# Patient Record
Sex: Female | Born: 1945 | Race: White | Hispanic: No | Smoking: Never smoker
Health system: Southern US, Community
[De-identification: ages and names within clinical notes are randomized; demographics above are authoritative.]

## PROBLEM LIST (undated history)

## (undated) DIAGNOSIS — M316 Other giant cell arteritis: Secondary | ICD-10-CM

## (undated) DIAGNOSIS — J449 Chronic obstructive pulmonary disease, unspecified: Secondary | ICD-10-CM

## (undated) DIAGNOSIS — J069 Acute upper respiratory infection, unspecified: Secondary | ICD-10-CM

## (undated) DIAGNOSIS — T4145XA Adverse effect of unspecified anesthetic, initial encounter: Secondary | ICD-10-CM

## (undated) DIAGNOSIS — I10 Essential (primary) hypertension: Secondary | ICD-10-CM

## (undated) DIAGNOSIS — I209 Angina pectoris, unspecified: Secondary | ICD-10-CM

## (undated) DIAGNOSIS — S0990XS Unspecified injury of head, sequela: Secondary | ICD-10-CM

## (undated) DIAGNOSIS — E7800 Pure hypercholesterolemia, unspecified: Secondary | ICD-10-CM

## (undated) DIAGNOSIS — D649 Anemia, unspecified: Secondary | ICD-10-CM

## (undated) DIAGNOSIS — K222 Esophageal obstruction: Secondary | ICD-10-CM

## (undated) DIAGNOSIS — N189 Chronic kidney disease, unspecified: Secondary | ICD-10-CM

## (undated) DIAGNOSIS — K317 Polyp of stomach and duodenum: Secondary | ICD-10-CM

## (undated) DIAGNOSIS — J189 Pneumonia, unspecified organism: Secondary | ICD-10-CM

## (undated) DIAGNOSIS — G44309 Post-traumatic headache, unspecified, not intractable: Secondary | ICD-10-CM

## (undated) DIAGNOSIS — F419 Anxiety disorder, unspecified: Secondary | ICD-10-CM

## (undated) DIAGNOSIS — M1990 Unspecified osteoarthritis, unspecified site: Secondary | ICD-10-CM

## (undated) DIAGNOSIS — K219 Gastro-esophageal reflux disease without esophagitis: Secondary | ICD-10-CM

## (undated) DIAGNOSIS — C801 Malignant (primary) neoplasm, unspecified: Secondary | ICD-10-CM

## (undated) DIAGNOSIS — H5440 Blindness, one eye, unspecified eye: Secondary | ICD-10-CM

## (undated) DIAGNOSIS — I639 Cerebral infarction, unspecified: Secondary | ICD-10-CM

## (undated) DIAGNOSIS — M719 Bursopathy, unspecified: Secondary | ICD-10-CM

## (undated) DIAGNOSIS — T8859XA Other complications of anesthesia, initial encounter: Secondary | ICD-10-CM

## (undated) DIAGNOSIS — R0602 Shortness of breath: Secondary | ICD-10-CM

## (undated) DIAGNOSIS — Z9981 Dependence on supplemental oxygen: Secondary | ICD-10-CM

## (undated) HISTORY — PX: CHOLECYSTECTOMY: SHX55

## (undated) HISTORY — PX: EYE SURGERY: SHX253

## (undated) HISTORY — PX: ABDOMINAL HYSTERECTOMY: SHX81

## (undated) HISTORY — DX: Other giant cell arteritis: M31.6

## (undated) HISTORY — PX: COLONOSCOPY: SHX174

## (undated) HISTORY — PX: BRAIN SURGERY: SHX531

## (undated) HISTORY — PX: HEEL SPUR SURGERY: SHX665

## (undated) HISTORY — DX: Esophageal obstruction: K22.2

## (undated) HISTORY — DX: Polyp of stomach and duodenum: K31.7

## (undated) HISTORY — PX: APPENDECTOMY: SHX54

---

## 1999-03-18 ENCOUNTER — Other Ambulatory Visit: Admission: RE | Admit: 1999-03-18 | Discharge: 1999-03-18 | Payer: Self-pay | Admitting: Family Medicine

## 2011-11-16 ENCOUNTER — Emergency Department (HOSPITAL_COMMUNITY): Payer: Medicare Other

## 2011-11-16 ENCOUNTER — Observation Stay (HOSPITAL_COMMUNITY)
Admission: EM | Admit: 2011-11-16 | Discharge: 2011-11-18 | Discharge status: Against Medical Advice | Payer: Medicare Other | Source: Ambulatory Visit | Attending: Internal Medicine | Admitting: Internal Medicine

## 2011-11-16 ENCOUNTER — Encounter (HOSPITAL_COMMUNITY): Payer: Self-pay | Admitting: *Deleted

## 2011-11-16 DIAGNOSIS — E119 Type 2 diabetes mellitus without complications: Secondary | ICD-10-CM | POA: Insufficient documentation

## 2011-11-16 DIAGNOSIS — D509 Iron deficiency anemia, unspecified: Secondary | ICD-10-CM | POA: Insufficient documentation

## 2011-11-16 DIAGNOSIS — R0609 Other forms of dyspnea: Secondary | ICD-10-CM | POA: Insufficient documentation

## 2011-11-16 DIAGNOSIS — R079 Chest pain, unspecified: Secondary | ICD-10-CM | POA: Diagnosis present

## 2011-11-16 DIAGNOSIS — R1013 Epigastric pain: Secondary | ICD-10-CM | POA: Insufficient documentation

## 2011-11-16 DIAGNOSIS — R1310 Dysphagia, unspecified: Secondary | ICD-10-CM | POA: Insufficient documentation

## 2011-11-16 DIAGNOSIS — R0602 Shortness of breath: Secondary | ICD-10-CM

## 2011-11-16 DIAGNOSIS — R0989 Other specified symptoms and signs involving the circulatory and respiratory systems: Secondary | ICD-10-CM | POA: Insufficient documentation

## 2011-11-16 DIAGNOSIS — I10 Essential (primary) hypertension: Secondary | ICD-10-CM | POA: Insufficient documentation

## 2011-11-16 DIAGNOSIS — R609 Edema, unspecified: Secondary | ICD-10-CM

## 2011-11-16 DIAGNOSIS — R0789 Other chest pain: Principal | ICD-10-CM | POA: Insufficient documentation

## 2011-11-16 DIAGNOSIS — R1319 Other dysphagia: Secondary | ICD-10-CM

## 2011-11-16 DIAGNOSIS — E1121 Type 2 diabetes mellitus with diabetic nephropathy: Secondary | ICD-10-CM | POA: Diagnosis present

## 2011-11-16 DIAGNOSIS — K3189 Other diseases of stomach and duodenum: Secondary | ICD-10-CM | POA: Insufficient documentation

## 2011-11-16 DIAGNOSIS — N289 Disorder of kidney and ureter, unspecified: Secondary | ICD-10-CM | POA: Insufficient documentation

## 2011-11-16 DIAGNOSIS — K317 Polyp of stomach and duodenum: Secondary | ICD-10-CM | POA: Diagnosis present

## 2011-11-16 DIAGNOSIS — M316 Other giant cell arteritis: Secondary | ICD-10-CM | POA: Insufficient documentation

## 2011-11-16 DIAGNOSIS — D131 Benign neoplasm of stomach: Secondary | ICD-10-CM | POA: Insufficient documentation

## 2011-11-16 HISTORY — DX: Other complications of anesthesia, initial encounter: T88.59XA

## 2011-11-16 HISTORY — DX: Cerebral infarction, unspecified: I63.9

## 2011-11-16 HISTORY — DX: Unspecified osteoarthritis, unspecified site: M19.90

## 2011-11-16 HISTORY — DX: Acute upper respiratory infection, unspecified: J06.9

## 2011-11-16 HISTORY — DX: Pneumonia, unspecified organism: J18.9

## 2011-11-16 HISTORY — DX: Chronic kidney disease, unspecified: N18.9

## 2011-11-16 HISTORY — DX: Post-traumatic headache, unspecified, not intractable: G44.309

## 2011-11-16 HISTORY — DX: Blindness, one eye, unspecified eye: H54.40

## 2011-11-16 HISTORY — DX: Malignant (primary) neoplasm, unspecified: C80.1

## 2011-11-16 HISTORY — DX: Anxiety disorder, unspecified: F41.9

## 2011-11-16 HISTORY — DX: Gastro-esophageal reflux disease without esophagitis: K21.9

## 2011-11-16 HISTORY — DX: Adverse effect of unspecified anesthetic, initial encounter: T41.45XA

## 2011-11-16 HISTORY — DX: Pure hypercholesterolemia, unspecified: E78.00

## 2011-11-16 HISTORY — DX: Angina pectoris, unspecified: I20.9

## 2011-11-16 HISTORY — DX: Post-traumatic headache, unspecified, not intractable: S09.90XS

## 2011-11-16 HISTORY — DX: Essential (primary) hypertension: I10

## 2011-11-16 HISTORY — DX: Chronic obstructive pulmonary disease, unspecified: J44.9

## 2011-11-16 HISTORY — DX: Anemia, unspecified: D64.9

## 2011-11-16 HISTORY — DX: Shortness of breath: R06.02

## 2011-11-16 MED ORDER — IOHEXOL 300 MG/ML  SOLN
20.0000 mL | INTRAMUSCULAR | Status: DC
  Administered 2011-11-17: 20 mL via ORAL

## 2011-11-16 NOTE — ED Notes (Signed)
Pt c/o SOB for "a couple days"  Worse today since fell at 8 pm.  CP in center of chest with inspiration

## 2011-11-17 ENCOUNTER — Emergency Department (HOSPITAL_COMMUNITY): Payer: Medicare Other

## 2011-11-17 ENCOUNTER — Encounter (HOSPITAL_COMMUNITY): Payer: Self-pay | Admitting: Internal Medicine

## 2011-11-17 ENCOUNTER — Inpatient Hospital Stay (HOSPITAL_COMMUNITY): Payer: Medicare Other

## 2011-11-17 DIAGNOSIS — M316 Other giant cell arteritis: Secondary | ICD-10-CM

## 2011-11-17 DIAGNOSIS — R1310 Dysphagia, unspecified: Secondary | ICD-10-CM | POA: Diagnosis present

## 2011-11-17 DIAGNOSIS — R1314 Dysphagia, pharyngoesophageal phase: Secondary | ICD-10-CM

## 2011-11-17 DIAGNOSIS — R1013 Epigastric pain: Secondary | ICD-10-CM | POA: Diagnosis present

## 2011-11-17 DIAGNOSIS — I10 Essential (primary) hypertension: Secondary | ICD-10-CM | POA: Diagnosis present

## 2011-11-17 DIAGNOSIS — D509 Iron deficiency anemia, unspecified: Secondary | ICD-10-CM | POA: Diagnosis present

## 2011-11-17 DIAGNOSIS — R079 Chest pain, unspecified: Secondary | ICD-10-CM | POA: Diagnosis present

## 2011-11-17 DIAGNOSIS — E1121 Type 2 diabetes mellitus with diabetic nephropathy: Secondary | ICD-10-CM | POA: Diagnosis present

## 2011-11-17 DIAGNOSIS — I517 Cardiomegaly: Secondary | ICD-10-CM

## 2011-11-17 DIAGNOSIS — E119 Type 2 diabetes mellitus without complications: Secondary | ICD-10-CM | POA: Diagnosis present

## 2011-11-17 HISTORY — DX: Other giant cell arteritis: M31.6

## 2011-11-17 LAB — COMPREHENSIVE METABOLIC PANEL
ALT: 19 U/L (ref 0–35)
Albumin: 2.8 g/dL — ABNORMAL LOW (ref 3.5–5.2)
Alkaline Phosphatase: 66 U/L (ref 39–117)
Alkaline Phosphatase: 66 U/L (ref 39–117)
BUN: 35 mg/dL — ABNORMAL HIGH (ref 6–23)
BUN: 34 mg/dL — ABNORMAL HIGH (ref 6–23)
CO2: 24 mEq/L (ref 19–32)
Chloride: 107 mEq/L (ref 96–112)
Chloride: 106 mEq/L (ref 96–112)
Creatinine, Ser: 1.97 mg/dL — ABNORMAL HIGH (ref 0.50–1.10)
GFR calc non Af Amer: 25 mL/min — ABNORMAL LOW (ref >90)
Glucose, Bld: 163 mg/dL — ABNORMAL HIGH (ref 70–99)
Potassium: 3.7 mEq/L (ref 3.5–5.1)
Potassium: 3.6 mEq/L (ref 3.5–5.1)
Sodium: 142 mEq/L (ref 135–145)
Total Bilirubin: 0.3 mg/dL (ref 0.3–1.2)
Total Bilirubin: 0.3 mg/dL (ref 0.3–1.2)

## 2011-11-17 LAB — URINALYSIS, ROUTINE W REFLEX MICROSCOPIC
Bilirubin Urine: NEGATIVE
Glucose, UA: 250 mg/dL — AB
Ketones, ur: NEGATIVE mg/dL
Leukocytes, UA: NEGATIVE
Nitrite: NEGATIVE
Protein, ur: 30 mg/dL — AB

## 2011-11-17 LAB — CARDIAC PANEL(CRET KIN+CKTOT+MB+TROPI)
CK, MB: 2.1 ng/mL (ref 0.3–4.0)
CK, MB: 1.6 ng/mL (ref 0.3–4.0)
Relative Index: INVALID (ref 0.0–2.5)
Troponin I: <0.3 ng/mL (ref <0.30)
Troponin I: <0.3 ng/mL (ref <0.30)

## 2011-11-17 LAB — HEMOGLOBIN A1C
Hgb A1c MFr Bld: 7.5 % — ABNORMAL HIGH (ref <5.7)
Mean Plasma Glucose: 169 mg/dL — ABNORMAL HIGH (ref <117)

## 2011-11-17 LAB — CBC
HCT: 28 % — ABNORMAL LOW (ref 36.0–46.0)
HCT: 27.9 % — ABNORMAL LOW (ref 36.0–46.0)
Hemoglobin: 8.6 g/dL — ABNORMAL LOW (ref 12.0–15.0)
MCHC: 30.5 g/dL (ref 30.0–36.0)
MCV: 84.8 fL (ref 78.0–100.0)
Platelets: 203 10*3/uL (ref 150–400)
RBC: 3.26 MIL/uL — ABNORMAL LOW (ref 3.87–5.11)
RDW: 17 % — ABNORMAL HIGH (ref 11.5–15.5)
WBC: 10.2 10*3/uL (ref 4.0–10.5)
WBC: 10.7 10*3/uL — ABNORMAL HIGH (ref 4.0–10.5)

## 2011-11-17 LAB — DIFFERENTIAL
Lymphocytes Relative: 8 % — ABNORMAL LOW (ref 12–46)
Lymphs Abs: 0.8 10*3/uL (ref 0.7–4.0)
Monocytes Absolute: 0.4 10*3/uL (ref 0.1–1.0)
Monocytes Relative: 4 % (ref 3–12)
Neutro Abs: 8.9 10*3/uL — ABNORMAL HIGH (ref 1.7–7.7)

## 2011-11-17 LAB — RETICULOCYTES: Retic Count, Absolute: 52.6 10*3/uL (ref 19.0–186.0)

## 2011-11-17 LAB — IRON AND TIBC: Saturation Ratios: 14 % — ABNORMAL LOW (ref 20–55)

## 2011-11-17 LAB — LIPASE, BLOOD: Lipase: 26 U/L (ref 11–59)

## 2011-11-17 LAB — URINE MICROSCOPIC-ADD ON

## 2011-11-17 MED ORDER — OXYCODONE-ACETAMINOPHEN 5-325 MG PO TABS: 1.0000 | ORAL_TABLET | Freq: Four times a day (QID) | ORAL | Status: DC | PRN

## 2011-11-17 MED ORDER — PANTOPRAZOLE SODIUM 40 MG PO TBEC: 40.0000 mg | DELAYED_RELEASE_TABLET | Freq: Every morning | ORAL | Status: DC

## 2011-11-17 MED ORDER — PREDNISONE 50 MG PO TABS
60.0000 mg | ORAL_TABLET | Freq: Every day | ORAL | Status: DC
  Administered 2011-11-17 – 2011-11-18 (×2): 60 mg via ORAL
  Filled 2011-11-17 (×3): qty 1

## 2011-11-17 MED ORDER — ONDANSETRON HCL 4 MG/2ML IJ SOLN: 4.0000 mg | Freq: Four times a day (QID) | INTRAMUSCULAR | Status: DC | PRN

## 2011-11-17 MED ORDER — ATORVASTATIN CALCIUM 40 MG PO TABS
40.0000 mg | ORAL_TABLET | Freq: Every day | ORAL | Status: DC
  Administered 2011-11-17: 40 mg via ORAL
  Filled 2011-11-17 (×2): qty 1

## 2011-11-17 MED ORDER — XENON XE 133 GAS
20.0000 | GAS_FOR_INHALATION | Freq: Once | RESPIRATORY_TRACT | Status: AC | PRN
  Administered 2011-11-17: 18 via RESPIRATORY_TRACT

## 2011-11-17 MED ORDER — ACETAMINOPHEN 325 MG PO TABS: 650.0000 mg | ORAL_TABLET | Freq: Four times a day (QID) | ORAL | Status: DC | PRN

## 2011-11-17 MED ORDER — INSULIN ASPART 100 UNIT/ML ~~LOC~~ SOLN
0.0000 [IU] | Freq: Three times a day (TID) | SUBCUTANEOUS | Status: DC
  Administered 2011-11-17: 1 [IU] via SUBCUTANEOUS
  Administered 2011-11-17: 3 [IU] via SUBCUTANEOUS
  Administered 2011-11-17 – 2011-11-18 (×2): 2 [IU] via SUBCUTANEOUS

## 2011-11-17 MED ORDER — AMLODIPINE BESYLATE 5 MG PO TABS
5.0000 mg | ORAL_TABLET | Freq: Every morning | ORAL | Status: DC
  Administered 2011-11-17 – 2011-11-18 (×2): 5 mg via ORAL
  Filled 2011-11-17 (×2): qty 1

## 2011-11-17 MED ORDER — FUROSEMIDE 10 MG/ML IJ SOLN
40.0000 mg | Freq: Once | INTRAMUSCULAR | Status: AC
  Administered 2011-11-17: 40 mg via INTRAVENOUS
  Filled 2011-11-17: qty 4

## 2011-11-17 MED ORDER — CALCIUM CARBONATE ANTACID 500 MG PO CHEW
1.0000 | CHEWABLE_TABLET | Freq: Once | ORAL | Status: AC
  Administered 2011-11-17: 200 mg via ORAL
  Filled 2011-11-17: qty 1

## 2011-11-17 MED ORDER — ACETAMINOPHEN 650 MG RE SUPP: 650.0000 mg | Freq: Four times a day (QID) | RECTAL | Status: DC | PRN

## 2011-11-17 MED ORDER — ASPIRIN 81 MG PO CHEW
324.0000 mg | CHEWABLE_TABLET | Freq: Once | ORAL | Status: AC
  Administered 2011-11-17: 324 mg via ORAL
  Filled 2011-11-17: qty 4

## 2011-11-17 MED ORDER — PANTOPRAZOLE SODIUM 40 MG IV SOLR
40.0000 mg | Freq: Two times a day (BID) | INTRAVENOUS | Status: DC
  Administered 2011-11-17: 40 mg via INTRAVENOUS
  Filled 2011-11-17 (×2): qty 40

## 2011-11-17 MED ORDER — HYDRALAZINE HCL 20 MG/ML IJ SOLN
10.0000 mg | INTRAMUSCULAR | Status: DC | PRN
  Filled 2011-11-17: qty 0.5

## 2011-11-17 MED ORDER — SODIUM CHLORIDE 0.9 % IJ SOLN
3.0000 mL | Freq: Two times a day (BID) | INTRAMUSCULAR | Status: DC
  Administered 2011-11-18: 3 mL via INTRAVENOUS

## 2011-11-17 MED ORDER — PANTOPRAZOLE SODIUM 40 MG PO TBEC
40.0000 mg | DELAYED_RELEASE_TABLET | Freq: Two times a day (BID) | ORAL | Status: DC
  Administered 2011-11-17 – 2011-11-18 (×2): 40 mg via ORAL
  Filled 2011-11-17 (×2): qty 1

## 2011-11-17 MED ORDER — SODIUM CHLORIDE 0.9 % IJ SOLN
3.0000 mL | Freq: Two times a day (BID) | INTRAMUSCULAR | Status: DC
  Administered 2011-11-17 (×2): 3 mL via INTRAVENOUS

## 2011-11-17 MED ORDER — ONDANSETRON HCL 4 MG PO TABS: 4.0000 mg | ORAL_TABLET | Freq: Four times a day (QID) | ORAL | Status: DC | PRN

## 2011-11-17 MED ORDER — INSULIN DETEMIR 100 UNIT/ML ~~LOC~~ SOLN
38.0000 [IU] | Freq: Every day | SUBCUTANEOUS | Status: DC
Start: 2011-11-17 — End: 2011-11-18
  Administered 2011-11-17: 38 [IU] via SUBCUTANEOUS
  Filled 2011-11-17: qty 10

## 2011-11-17 MED ORDER — TECHNETIUM TO 99M ALBUMIN AGGREGATED
3.0000 | Freq: Once | INTRAVENOUS | Status: AC | PRN
  Administered 2011-11-17: 3 via INTRAVENOUS

## 2011-11-17 MED ORDER — LORATADINE 10 MG PO TABS
10.0000 mg | ORAL_TABLET | Freq: Every morning | ORAL | Status: DC
  Administered 2011-11-17 – 2011-11-18 (×2): 10 mg via ORAL
  Filled 2011-11-17 (×2): qty 1

## 2011-11-17 NOTE — ED Notes (Signed)
Patient started drinking contrast.  Instructed to drink 1 cup in 1 hour

## 2011-11-17 NOTE — Progress Notes (Signed)
Pt complained of upper abdomen pain rated 5 of 10, then rolled onto side and let out a large burp and pain subsided to zero.

## 2011-11-17 NOTE — ED Notes (Signed)
Savannah Lindsey  Son [phone] Please son call discharge

## 2011-11-17 NOTE — Progress Notes (Cosign Needed)
[facility] hospitalist progress note. Chief complaint. Epigastric/substernal pain. History of present illness. This 66 year old female in hospital with atypical chest pain. Cardiac enzymes are being cycled and have been negative x2. Patient complained to staff about the a.m. burning pain located in the epigastric/substernal area. There is no radiation, no diaphoresis, no nausea associated. Patient denies dyspnea or syncope. She states that she did get out of bed and go for walk help her pain. 12-lead EKG was done and this does not suggest ischemia and indicated normal sinus rhythm. Vital signs. Temperature 98.3, pulse 70, respiration 18, blood pressure 160/86. O2 sats 95%. General appearance. Alert elderly female in no distress. Cooperative, oriented and pleasant. Cardiac. Rate and rhythm regular. She does have 2+ peripheral edema to the mid thigh bilaterally. No calf pain and negative Homans. Lungs. Diminished in all fields but clear without distress or cough. Abdomen. Soft and obese with positive bowel sounds. No pain. Impression/plan. Problem #1 epigastric/substernal pain. My clinical impressions are that this is likely GI in origin. I have provided the patient with Tums for a likely reflux. Her 12-lead EKG looks unremarkable. Will continue to cycle cardiac enzymes for an additional 3 sets. We'll follow for these results.

## 2011-11-17 NOTE — H&P (Addendum)
Savannah Lindsey is an 66 y.o. female.   PCP - Dr.Nathan Anna Genre. Chief Complaint: Chest pain. 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 The chest pain is burning in sensation and constant. Denies any radiation or exertional symptoms. Patient has mild shortness of breath on exertion which patient states it is not new. In addition patient has been noticing increasing swelling of her extremities and abdomen. Patient has been on steroids for last one month for her temporal arteritis. CT abdomen pelvis done in the ER does not show anything acute. In addition patient is found to have renal failure and anemia. Patient does not recall having anemia previously. Stool for occult blood is negative. Patient will be admitted for further observation.  Past Medical History  Diagnosis Date  . Diabetes mellitus   . Hypertension   . COPD (chronic obstructive pulmonary disease)   . GERD (gastroesophageal reflux disease)   . Hypercholesteremia   . Anxiety   . Stroke   . Headaches due to old head injury   . Blind one eye     Past Surgical History  Procedure Date  . Abdominal hysterectomy   . Cholecystectomy   . Heel spur surgery     bilateral    History reviewed. No pertinent family history. Social History:  reports that she has never smoked. She does not have any smokeless tobacco history on file. She reports that she does not drink alcohol. Her drug history not on file.  Allergies:  Allergies  Allergen Reactions  . Codeine Hives and Nausea Only     (Not in a hospital admission)  Results for orders placed during the hospital encounter of 11/16/11 (from the past 48 hour(s))  CBC     Status: Abnormal   Collection Time   11/16/11 11:33 PM      Component Value Range Comment   WBC 10.2  4.0 - 10.5 (K/uL)    RBC 3.26 (*) 3.87 - 5.11 (MIL/uL)    Hemoglobin 8.6 (*) 12.0 - 15.0 (g/dL)    HCT 16.1  (*) 09.6 - 46.0 (%)    MCV 85.9  78.0 - 100.0 (fL)    MCH 26.4  26.0 - 34.0 (pg)    MCHC 30.7  30.0 - 36.0 (g/dL)    RDW 04.5 (*) 40.9 - 15.5 (%)    Platelets 211  150 - 400 (K/uL)   DIFFERENTIAL     Status: Abnormal   Collection Time   11/16/11 11:33 PM      Component Value Range Comment   Neutrophils Relative 88 (*) 43 - 77 (%)    Neutro Abs 8.9 (*) 1.7 - 7.7 (K/uL)    Lymphocytes Relative 8 (*) 12 - 46 (%)    Lymphs Abs 0.8  0.7 - 4.0 (K/uL)    Monocytes Relative 4  3 - 12 (%)    Monocytes Absolute 0.4  0.1 - 1.0 (K/uL)    Eosinophils Relative 0  0 - 5 (%)    Eosinophils Absolute 0.0  0.0 - 0.7 (K/uL)    Basophils Relative 0  0 - 1 (%)    Basophils Absolute 0.0  0.0 - 0.1 (K/uL)   COMPREHENSIVE METABOLIC PANEL     Status: Abnormal   Collection Time   11/16/11 11:33 PM      Component Value Range Comment   Sodium 141  135 - 145 (mEq/L)  Potassium 3.7  3.5 - 5.1 (mEq/L)    Chloride 107  96 - 112 (mEq/L)    CO2 24  19 - 32 (mEq/L)    Glucose, Bld 199 (*) 70 - 99 (mg/dL)    BUN 35 (*) 6 - 23 (mg/dL)    Creatinine, Ser 1.61 (*) 0.50 - 1.10 (mg/dL)    Calcium 8.3 (*) 8.4 - 10.5 (mg/dL)    Total Protein 5.5 (*) 6.0 - 8.3 (g/dL)    Albumin 2.8 (*) 3.5 - 5.2 (g/dL)    AST 11  0 - 37 (U/L)    ALT 19  0 - 35 (U/L)    Alkaline Phosphatase 66  39 - 117 (U/L)    Total Bilirubin 0.3  0.3 - 1.2 (mg/dL)    GFR calc non Af Amer 25 (*) >90 (mL/min)    GFR calc Af Amer 29 (*) >90 (mL/min)   LIPASE, BLOOD     Status: Normal   Collection Time   11/16/11 11:33 PM      Component Value Range Comment   Lipase 26  11 - 59 (U/L)   TROPONIN I     Status: Normal   Collection Time   11/16/11 11:33 PM      Component Value Range Comment   Troponin I <0.30  <0.30 (ng/mL)   PRO B NATRIURETIC PEPTIDE     Status: Abnormal   Collection Time   11/16/11 11:33 PM      Component Value Range Comment   Pro B Natriuretic peptide (BNP) 385.5 (*) 0 - 125 (pg/mL)   URINALYSIS, ROUTINE W REFLEX MICROSCOPIC      Status: Abnormal   Collection Time   11/17/11 12:58 AM      Component Value Range Comment   Color, Urine YELLOW  YELLOW     APPearance CLOUDY (*) CLEAR     Specific Gravity, Urine 1.015  1.005 - 1.030     pH 5.5  5.0 - 8.0     Glucose, UA 250 (*) NEGATIVE (mg/dL)    Hgb urine dipstick NEGATIVE  NEGATIVE     Bilirubin Urine NEGATIVE  NEGATIVE     Ketones, ur NEGATIVE  NEGATIVE (mg/dL)    Protein, ur 30 (*) NEGATIVE (mg/dL)    Urobilinogen, UA 0.2  0.0 - 1.0 (mg/dL)    Nitrite NEGATIVE  NEGATIVE     Leukocytes, UA NEGATIVE  NEGATIVE    URINE MICROSCOPIC-ADD ON     Status: Abnormal   Collection Time   11/17/11 12:58 AM      Component Value Range Comment   Squamous Epithelial / LPF FEW (*) RARE     Bacteria, UA RARE  RARE    OCCULT BLOOD, POC DEVICE     Status: Normal   Collection Time   11/17/11  3:23 AM      Component Value Range Comment   Fecal Occult Bld NEGATIVE      Ct Abdomen Pelvis Wo Contrast  11/17/2011  *RADIOLOGY REPORT*  Clinical Data: Mid upper abdominal pain and nausea.  No IV contrast material was given due to renal insufficiency.  CT ABDOMEN AND PELVIS WITHOUT CONTRAST  Technique:  Multidetector CT imaging of the abdomen and pelvis was performed following the standard protocol without intravenous contrast.  Comparison: CT chest 02/23/2009  Findings: Fibrosis or atelectasis in the lung bases.  Contrast material in the esophagus may represent reflux or dysmotility.  Surgical absence of the gallbladder.  The unenhanced appearance of the liver,  spleen, pancreas, adrenal glands, kidneys, abdominal aorta, and retroperitoneal lymph nodes is unremarkable.  The stomach, small bowel, and colon are not distended. Small diverticulum in the third portion of the duodenum.  Stool fills the colon.  No colonic wall thickening.  Colonic diverticulosis.  The no free air or free fluid in the abdomen.  Prominent visceral adipose tissues.  Pelvis:  Surgical absence of the uterus.  Bladder wall is  not thickened.  Diverticulosis of the sigmoid colon without inflammatory changes.  The appendix is normal.  No free or loculated pelvic fluid collections.  Surgical clips in the pelvis. No abnormal adnexal masses.  No significant pelvic lymphadenopathy. Normal alignment of the lumbar vertebrae.  IMPRESSION: No focal acute process demonstrated in the abdomen or pelvis.  No abnormality is identified to account for the patient's symptoms.  Original Report Authenticated By: Marlon Pel, M.D.   Dg Chest 2 View  11/16/2011  *RADIOLOGY REPORT*  Clinical Data: Cough.  Shortness of breath.  Weakness. Hypertension.  COPD.  CHEST - 2 VIEW  Comparison: 02/28/2009  Findings: Borderline cardiomegaly noted with prominent epicardial adipose tissue.  The lungs appear clear.  No pleural effusion observed.  IMPRESSION:  1.  Borderline cardiomegaly.   Otherwise, no significant abnormality identified.  Original Report Authenticated By: Dellia Cloud, M.D.    Review of Systems  HENT: Negative.   Eyes: Negative.   Respiratory: Positive for shortness of breath.   Cardiovascular: Positive for chest pain.  Gastrointestinal: Negative.   Genitourinary: Negative.   Musculoskeletal: Negative.   Skin: Negative.   Neurological: Negative.   Endo/Heme/Allergies: Negative.   Psychiatric/Behavioral: Negative.     Blood pressure 164/89, pulse 80, temperature 98.3 F (36.8 C), temperature source Oral, resp. rate 13, SpO2 95.00%. Physical Exam  Constitutional: She is oriented to person, place, and time. She appears well-developed and well-nourished. No distress.  HENT:  Head: Normocephalic and atraumatic.  Right Ear: External ear normal.  Left Ear: External ear normal.  Nose: Nose normal.  Mouth/Throat: Oropharynx is clear and moist. No oropharyngeal exudate.  Eyes: Conjunctivae are normal. Pupils are equal, round, and reactive to light. Right eye exhibits no discharge. Left eye exhibits no discharge. No  scleral icterus.       Has difficulty seeing completely.  Neck: Normal range of motion. Neck supple.  Cardiovascular: Normal rate and regular rhythm.   Respiratory: Effort normal and breath sounds normal. No respiratory distress. She has no wheezes. She has no rales.  GI: Soft. Bowel sounds are normal. She exhibits no distension. There is no tenderness. There is no rebound.  Musculoskeletal: Normal range of motion. She exhibits edema (two plus edema both lower extremity.). She exhibits no tenderness.  Neurological: She is alert and oriented to person, place, and time.       Moves all extremities.  Skin: Skin is warm and dry. She is not diaphoretic.  Psychiatric: Her behavior is normal.     Assessment/Plan #1. Chest pain atypical - most likely this is from GI cause given her recent steroid use. But given her risk factors we will cycle cardiac markers and also check a VQ scan. Will place patient on Protonix. #2. Renal failure with anemia - we do not have a baseline labs. Patient states she does not recall having anemia. I will recheck a CBC to make sure there is no dramatic decrease in hemoglobin. Check anemia panel. For her renal failure we will closely follow intake output. CT abdomen does not show  anything acute including hydronephrosis or obstruction. Urinalysis does not show any cast. Given that patient has both renal failure and anemia we will check serum protein electrophoresis and UPEP. We'll hold Diovan until sure that that creatinine is not worsening. #3. Edema - this may be related to her steroid use recently for temporal arteritis. Closely follow intake output and metabolic panel. Patient's amlodipine which also may be contributing the patient states the edema increased recently. Patient does have mild hypoalbuminemia which may also contribute to edema. May consider Lasix if patient is not orthostatic. #4. Hypertension - continue present medications except for holding off Diovan until we  assure her creatinine is not worsening. #5. Diabetes mellitus2 - continue home medications with sliding-scale coverage. #6. Recently diagnosed temporal arteritis on prednisone.  CODE STATUS - full code.  Richrd Kuzniar N. 11/17/2011, 5:43 AM

## 2011-11-17 NOTE — Consult Note (Addendum)
[facility] Consult: 2:22 PM 11/17/2011   Referring Provider: Cleotis Lema  Primary Care Physician:  Lonie Peak, [place]   [facility] Primary Gastroenterologist:  Gentry Fitz   Reason for Consultation:  NCCP, GERD  HPI: Savannah Lindsey is a 66 y.o. female.  Diabetic.  On Prednisone since April  for diagnoses of temporal arteritis. Sugars have gone into the 500s at times since then.  She has developed generalized edema in face, arms, legs.    Several months of nocturnal burning epigastric discomfort.  Occurs about 3 nights per week. Not associated with nausea.  Eases if she can belch or if she sits/stands up.  She takes Protonix daily, it does not help. The sxs began at least 2 months before she began prednisone.  Yesterday evening she had the pain and it never went away.  She also had SOB all day long. In hospital the cardiac enzymes and EKG are normal.  CXR with Cardiomegaly.   Symptoms are better this afternoon but the chest burning continues.  She also has dysphagia and chokes on bread and grits, sometimes regurgitates. She has normocytic anemia, and is not aware of previous diagnoses of this.  Denies ever taking PO Iron.  No previous transfusions. Positional dizzyness.  Eyesight is diminished, lacks sharp central vision since her CVA  No prior EGD    Past Medical History  Diagnosis Date  . Diabetes mellitus   . Hypertension   . COPD (chronic obstructive pulmonary disease)   . GERD (gastroesophageal reflux disease)   . Hypercholesteremia   . Anxiety   . Stroke   . Headaches due to old head injury   . Blind one eye   . Complication of anesthesia     difficulty waking up  . Angina   . Shortness of breath   . Pneumonia   . Recurrent upper respiratory infection (URI)   . Anemia   . Chronic kidney disease   . Cancer   . Arthritis     Past Surgical History  Procedure Date  . Abdominal hysterectomy   . Cholecystectomy   . Heel  spur surgery     bilateral  . Brain surgery     Prior to Admission medications   Medication Sig Start Date End Date Taking? Authorizing Provider  amLODipine (NORVASC) 5 MG tablet Take 5 mg by mouth every morning.    Yes Historical Provider, MD  atorvastatin (LIPITOR) 40 MG tablet Take 40 mg by mouth every morning.    Yes Historical Provider, MD  insulin aspart (NOVOLOG FLEXPEN) 100 UNIT/ML injection Inject 12-14 Units into the skin 3 (three) times daily before meals. Sliding scale as directed. Takes anywhere from 12-14 units according to blood sugar and sliding scale.   Yes Historical Provider, MD  insulin detemir (LEVEMIR) 100 UNIT/ML injection Inject 38 Units into the skin at bedtime.   Yes Historical Provider, MD  loratadine (CLARITIN) 10 MG tablet Take 10 mg by mouth every morning.    Yes Historical Provider, MD  losartan (COZAAR) 50 MG tablet Take 50 mg by mouth every morning.    Yes Historical Provider, MD  oxyCODONE-acetaminophen (PERCOCET) 5-325 MG per tablet Take 1 tablet by mouth 4 (four) times daily as needed. For headaches   Yes Historical Provider, MD  pantoprazole (PROTONIX) 40 MG tablet Take 40 mg by mouth every morning.   Yes Historical Provider, MD  predniSONE (DELTASONE) 20 MG tablet Take 60 mg by mouth every morning.   Yes Historical Provider, MD  Scheduled Meds:    . amLODipine  5 mg Oral q morning - 10a  . aspirin  324 mg Oral Once  . atorvastatin  40 mg Oral q1800  . furosemide  40 mg Intravenous Once  . insulin aspart  0-9 Units Subcutaneous TID WC  . insulin detemir  38 Units Subcutaneous QHS  . loratadine  10 mg Oral q morning - 10a  . pantoprazole  40 mg Oral BID AC  . predniSONE  60 mg Oral Q breakfast  . sodium chloride  3 mL Intravenous Q12H  . sodium chloride  3 mL Intravenous Q12H  ns:   PRN Meds: acetaminophen, acetaminophen, hydrALAZINE, ondansetron (ZOFRAN) IV, ondansetron, oxyCODONE-acetaminophen, technetium albumin aggregated, xenon xe  133   Allergies as of 11/16/2011 - Review Complete 11/16/2011  Allergen Reaction Noted  . Codeine Hives and Nausea Only 11/16/2011    History reviewed. No pertinent family history.  History   Social History  . Marital Status: Married    Social History Main Topics  . Smoking status: Never Smoker   . Smokeless tobacco: Never Used  . Alcohol Use: No  . Drug Use: No  . Sexually Active: No      REVIEW OF SYSTEMS: See HPI for pertinent elements of 14 point ROS  PHYSICAL EXAM: Vital signs in last 24 hours: Temp:  [97.8 F (36.6 C)-98.9 F (37.2 C)] 98.9 F (37.2 C) (05/17 1347) Pulse Rate:  [67-104] 74  (05/17 1347) Resp:  [13-20] 16  (05/17 1347) BP: (128-164)/(70-91) 150/79 mmHg (05/17 1347) SpO2:  [92 %-99 %] 92 % (05/17 1347) Weight:  [214 lb 4.6 oz (97.2 kg)] 214 lb 4.6 oz (97.2 kg) (05/17 0615)  General: cushingoid obes wf.  Looks ill Head:  No signs of trauma  Eyes:  No icterus or Pallor Ears:  Not HOH  Nose:  No discharge or bleeding Mouth:  Moist, clear MM Neck:  No mass or JVD Lungs:  ronchi at base on left Heart: RRR.  No MRG Abdomen:  Obese, soft, NT, ND, no masses, bruits.  Active BS.   Rectal: deferred   Musc/Skeltl: no gross joint deformity Extremities:  Non pitting pedal, LE and UE edema  Neurologic:  Pleasant.  Moves all 4s.  Fully oriented. No tremor Skin:  No rash. Heme:  Purpura on arms, bruising in abdomen Tattoos:  none  Psych:  Pleasant, not anxious.  Seems depressed.   Intake/Output from previous day: 05/16 0701 - 05/17 0700 In: -  Out: 1400 [Urine:1400] Intake/Output this shift: Total I/O In: 483 [P.O.:480; I.V.:3] Out: 1400 [Urine:1400]  LAB RESULTS:  Basename 11/17/11 0630 11/16/11 2333  WBC 10.7* 10.2  HGB 8.5* 8.6*  HCT 27.9* 28.0*  PLT 203 211  MCV                                  85.9   Ref. Range 11/17/2011 06:30  Iron Latest Range: 42-135 ug/dL 33 (L)  UIBC Latest Range: 125-400 ug/dL 409  TIBC Latest Range:  250-470 ug/dL 811 (L)  Saturation Ratios Latest Range: 20-55 % 14 (L)  Ferritin Latest Range: 10-291 ng/mL 28  Folate No range found >20.0    BMET Lab Results  Component Value Date   NA 142 11/17/2011   NA 141 11/16/2011   K 3.6 11/17/2011   K 3.7 11/16/2011   CL 106 11/17/2011   CL 107 11/16/2011   CO2 22  11/17/2011   CO2 24 11/16/2011   GLUCOSE 163* 11/17/2011   GLUCOSE 199* 11/16/2011   BUN 34* 11/17/2011   BUN 35* 11/16/2011   CREATININE 1.94* 11/17/2011   CREATININE 1.97* 11/16/2011   CALCIUM 8.4 11/17/2011   CALCIUM 8.3* 11/16/2011   LFT  Basename 11/17/11 0630 11/16/11 2333  PROT 5.5* 5.5*  ALBUMIN 2.8* 2.8*  AST 12 11  ALT 19 19  ALKPHOS 66 66  BILITOT 0.3 0.3  BILIDIR -- --  IBILI -- --   PT/INR No results found for this basename: INR, PROTIME    RADIOLOGY STUDIES: Ct Abdomen Pelvis Wo Contrast 11/17/2011  *RADIOLOGY REPORT*  Clinical Data: Mid upper abdominal pain and nausea.  No IV contrast material was given due to renal insufficiency.  CT ABDOMEN AND PELVIS WITHOUT CONTRAST  Technique:  Multidetector CT imaging of the abdomen and pelvis was performed following the standard protocol without intravenous contrast.  Comparison: CT chest 02/23/2009  Findings: Fibrosis or atelectasis in the lung bases.  Contrast material in the esophagus may represent reflux or dysmotility.  Surgical absence of the gallbladder.  The unenhanced appearance of the liver, spleen, pancreas, adrenal glands, kidneys, abdominal aorta, and retroperitoneal lymph nodes is unremarkable.  The stomach, small bowel, and colon are not distended. Small diverticulum in the third portion of the duodenum.  Stool fills the colon.  No colonic wall thickening.  Colonic diverticulosis.  The no free air or free fluid in the abdomen.  Prominent visceral adipose tissues.  Pelvis:  Surgical absence of the uterus.  Bladder wall is not thickened.  Diverticulosis of the sigmoid colon without inflammatory changes.  The appendix is  normal.  No free or loculated pelvic fluid collections.  Surgical clips in the pelvis. No abnormal adnexal masses.  No significant pelvic lymphadenopathy. Normal alignment of the lumbar vertebrae.  IMPRESSION: No focal acute process demonstrated in the abdomen or pelvis.  No abnormality is identified to account for the patient's symptoms.  Original Report Authenticated By: Marlon Pel, M.D.   Dg Chest 2 View  11/16/2011  *RADIOLOGY REPORT*  Clinical Data: Cough.  Shortness of breath.  Weakness. Hypertension.  COPD.  CHEST - 2 VIEW  Comparison: 02/28/2009  Findings: Borderline cardiomegaly noted with prominent epicardial adipose tissue.  The lungs appear clear.  No pleural effusion observed.  IMPRESSION:  1.  Borderline cardiomegaly.   Otherwise, no significant abnormality identified.  Original Report Authenticated By: Dellia Cloud, M.D.   Nm Pulmonary Per & Vent  11/17/2011  *RADIOLOGY REPORT*  Clinical Data: Shortness of breath.  NM PULMONARY VENTILATION AND PERFUSION SCAN  Radiopharmaceutical: CURIE MAA TECHNETIUM TO 26M ALBUMIN AGGREGATED, CURIE xenon xe 133 gas 20 milli Curie XENON XE 133 GAS  Comparison: Chest x-ray 11/16/2011  Findings: Initial perfusion imaging demonstrated a medium sized perfusion defect in the left base on the LPO projection. Ventilation imaging was therefore performed in the LPO and RAO projection.  No ventilation defects noted in this area.  Therefore this is a medium sized VQ mismatch.  No other VQ mismatches.  IMPRESSION: Single medium sized VQ mismatch in the left lung base.  Study is intermediate/indeterminate for pulmonary embolus.  Original Report Authenticated By: Cyndie Chime, M.D.    ENDOSCOPIC STUDIES: Recalls having a colonoscopy many years ago.  Does not know who, when, where done.  IMPRESSION: *  Burning epigastric pain, despite daily Protonix. *  Dysphagia *  Normocytic anemia.  Iron level low.  Baseline CBC  unknown. Iron  deficiency anemia - ferritin 28 *  Temporal arteritis, treated with Prednisone, current dose is 60 mg daily  *  Renal insufficiency.  *  Dyspnea  PLAN: *  Needs EGD, timing to be determined.    LOS: 1 day   Jennye Moccasin  11/17/2011, 2:22 PM Pager: [phone]      [facility] Attending  I have also seen and assessed the patient and agree with the above note. I have reviewed the information and hx and PE same. CT images viewed.  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 Could have esophageal Candidiasis or other infection on prednisone.  Plan for EGD and possible esophageal dilation tomorrow AM The risks and benefits as well as alternatives of endoscopic procedure(s) have been discussed and reviewed. All questions answered. The patient agrees to proceed.  She has an iron deficiency anemia and a colonoscopy would be appropriate to investigate unless EGD shows a cause. Colonoscopy can be done electively as an outpatient .   I appreciate the opportunity to care for this patient.  Iva Boop, MD, [facility] [phone] (pager) 11/17/2011 6:02 PM

## 2011-11-17 NOTE — Progress Notes (Signed)
UR Completed. Kenya Kook, RN, Nurse Case Manager [phone]     

## 2011-11-17 NOTE — ED Provider Notes (Signed)
History     CSN: [id number]  Arrival date & time 11/16/11  2239   First MD Initiated Contact with Patient 11/16/11 2329      Chief Complaint  Patient presents with  . Shortness of Breath    (Consider location/radiation/quality/duration/timing/severity/associated sxs/prior treatment) HPI  66yoF h/o COPD, DM, GERD, HLD pw shortness of breath.  She states that she began to feel short of breath since 8 PM last night. She states she had epigastric pain at that time. "I don't really know if it's chest pain". She describes it as a burning sensation in the epigastrium and lower chest without radiation. Not worse with movement. Constant and not worse with inspiration She denies nausea, vomiting or diaphoresis. Unable to assess for paroxysmal nocturnal dyspnea or orthopnea. She states that she usually sleeps with 6 pillows which is unchanged or she sleeps in a chair. She does complain of worsening bilateral lower extr pitting edema for the past one week. Denies fever/chills. Min cough.   ED Notes, ED Provider Notes from 11/16/11 0000 to 11/16/11 22:54:55       Christa West Carbo, RN 11/16/2011 22:50      Pt c/o SOB for "a couple days" Worse today since fell at 8 pm. CP in center of chest with inspiration     Past Medical History  Diagnosis Date  . Diabetes mellitus   . Hypertension   . COPD (chronic obstructive pulmonary disease)   . GERD (gastroesophageal reflux disease)   . Hypercholesteremia   . Anxiety   . Stroke   . Headaches due to old head injury   . Blind one eye     Past Surgical History  Procedure Date  . Abdominal hysterectomy   . Cholecystectomy   . Heel spur surgery     bilateral    History reviewed. No pertinent family history.  History  Substance Use Topics  . Smoking status: Never Smoker   . Smokeless tobacco: Not on file  . Alcohol Use: No    OB History    Grav Para Term Preterm Abortions TAB SAB Ect Mult Living                  Review of Systems    All other systems reviewed and are negative.   except as noted HPI   Allergies  Codeine  Home Medications   Current Outpatient Rx  Name Route Sig Dispense Refill  . AMLODIPINE BESYLATE 5 MG PO TABS Oral Take 5 mg by mouth every morning.     . ATORVASTATIN CALCIUM 40 MG PO TABS Oral Take 40 mg by mouth every morning.     . INSULIN ASPART 100 UNIT/ML [place] SOLN Subcutaneous Inject 12-14 Units into the skin 3 (three) times daily before meals. Sliding scale as directed. Takes anywhere from 12-14 units according to blood sugar and sliding scale.    . INSULIN DETEMIR 100 UNIT/ML  SOLN Subcutaneous Inject 38 Units into the skin at bedtime.    Marland Kitchen LORATADINE 10 MG PO TABS Oral Take 10 mg by mouth every morning.     Marland Kitchen LOSARTAN POTASSIUM 50 MG PO TABS Oral Take 50 mg by mouth every morning.     . OXYCODONE-ACETAMINOPHEN 5-325 MG PO TABS Oral Take 1 tablet by mouth 4 (four) times daily as needed. For headaches    . PANTOPRAZOLE SODIUM 40 MG PO TBEC Oral Take 40 mg by mouth every morning.    Marland Kitchen PREDNISONE 20 MG PO TABS Oral Take  60 mg by mouth every morning.      BP 140/70  Pulse 80  Temp(Src) 98.3 F (36.8 C) (Oral)  Resp 18  SpO2 99%  Physical Exam  Nursing note and vitals reviewed. Constitutional: She is oriented to person, place, and time. She appears well-developed.  HENT:  Head: Atraumatic.  Mouth/Throat: Oropharynx is clear and moist.  Eyes: Conjunctivae and EOM are normal. Pupils are equal, round, and reactive to light.  Neck: Normal range of motion. Neck supple.  Cardiovascular: Normal rate, regular rhythm, normal heart sounds and intact distal pulses.   Pulmonary/Chest: Effort normal and breath sounds normal. No respiratory distress. She has no wheezes. She has no rales.       No wheezing  Abdominal: Soft. She exhibits no distension. There is tenderness. There is no rebound and no guarding.       Epigastric ttp No r/g  Musculoskeletal: Normal range of motion. She exhibits  edema. She exhibits no tenderness.       B/l 3+ pitting edema LE  Neurological: She is alert and oriented to person, place, and time.  Skin: Skin is warm and dry. No rash noted.  Psychiatric: She has a normal mood and affect.    Date: 11/17/2011  Rate: 75  Rhythm: normal sinus rhythm  QRS Axis: normal  Intervals: normal  ST/T Wave abnormalities: normal  Conduction Disutrbances:none  Narrative Interpretation:   Old EKG Reviewed: none available    ED Course  Procedures (including critical care time)  Labs Reviewed  CBC - Abnormal; Notable for the following:    RBC 3.26 (*)    Hemoglobin 8.6 (*)    HCT 28.0 (*)    RDW 17.0 (*)    All other components within normal limits  DIFFERENTIAL - Abnormal; Notable for the following:    Neutrophils Relative 88 (*)    Neutro Abs 8.9 (*)    Lymphocytes Relative 8 (*)    All other components within normal limits  COMPREHENSIVE METABOLIC PANEL - Abnormal; Notable for the following:    Glucose, Bld 199 (*)    BUN 35 (*)    Creatinine, Ser 1.97 (*)    Calcium 8.3 (*)    Total Protein 5.5 (*)    Albumin 2.8 (*)    GFR calc non Af Amer 25 (*)    GFR calc Af Amer 29 (*)    All other components within normal limits  URINALYSIS, ROUTINE W REFLEX MICROSCOPIC - Abnormal; Notable for the following:    APPearance CLOUDY (*)    Glucose, UA 250 (*)    Protein, ur 30 (*)    All other components within normal limits  PRO B NATRIURETIC PEPTIDE - Abnormal; Notable for the following:    Pro B Natriuretic peptide (BNP) 385.5 (*)    All other components within normal limits  URINE MICROSCOPIC-ADD ON - Abnormal; Notable for the following:    Squamous Epithelial / LPF FEW (*)    All other components within normal limits  LIPASE, BLOOD  TROPONIN I  OCCULT BLOOD, POC DEVICE   Ct Abdomen Pelvis Wo Contrast  11/17/2011  *RADIOLOGY REPORT*  Clinical Data: Mid upper abdominal pain and nausea.  No IV contrast material was given due to renal  insufficiency.  CT ABDOMEN AND PELVIS WITHOUT CONTRAST  Technique:  Multidetector CT imaging of the abdomen and pelvis was performed following the standard protocol without intravenous contrast.  Comparison: CT chest 02/23/2009  Findings: Fibrosis or atelectasis in the lung  bases.  Contrast material in the esophagus may represent reflux or dysmotility.  Surgical absence of the gallbladder.  The unenhanced appearance of the liver, spleen, pancreas, adrenal glands, kidneys, abdominal aorta, and retroperitoneal lymph nodes is unremarkable.  The stomach, small bowel, and colon are not distended. Small diverticulum in the third portion of the duodenum.  Stool fills the colon.  No colonic wall thickening.  Colonic diverticulosis.  The no free air or free fluid in the abdomen.  Prominent visceral adipose tissues.  Pelvis:  Surgical absence of the uterus.  Bladder wall is not thickened.  Diverticulosis of the sigmoid colon without inflammatory changes.  The appendix is normal.  No free or loculated pelvic fluid collections.  Surgical clips in the pelvis. No abnormal adnexal masses.  No significant pelvic lymphadenopathy. Normal alignment of the lumbar vertebrae.  IMPRESSION: No focal acute process demonstrated in the abdomen or pelvis.  No abnormality is identified to account for the patient's symptoms.  Original Report Authenticated By: Marlon Pel, M.D.   Dg Chest 2 View  11/16/2011  *RADIOLOGY REPORT*  Clinical Data: Cough.  Shortness of breath.  Weakness. Hypertension.  COPD.  CHEST - 2 VIEW  Comparison: 02/28/2009  Findings: Borderline cardiomegaly noted with prominent epicardial adipose tissue.  The lungs appear clear.  No pleural effusion observed.  IMPRESSION:  1.  Borderline cardiomegaly.   Otherwise, no significant abnormality identified.  Original Report Authenticated By: Dellia Cloud, M.D.    1. Shortness of breath   2. Chest pain   3. Edema   4. Renal insufficiency    MDM  Fluid  overloaded. CP/SOB without pulmonary edema. H/O COPD without wheezing. Renal insufficiency noted. Cardiac w/u thus far unremarkable. Doubt PE clinically. Anemia unclear etiology. Lasix, asa ordered. Admitted to [facility] for further w/u and evaluation.         Forbes Cellar, MD 11/17/11 [phone]

## 2011-11-17 NOTE — Progress Notes (Signed)
  Echocardiogram 2D Echocardiogram has been performed.  Savannah Lindsey 11/17/2011, 4:32 PM

## 2011-11-17 NOTE — Progress Notes (Signed)
H&P reviewed ,patient seen and examined ,c/o epigastric burning sensation that radiates to her throat ,described it as heart burn like sensation .She denies any nausea or vomiting  A/P  Atypical chest pain  Description seems like GERD ,cardiac enzymes negative x 2 GERD: In the setting of recent steroid therapy GI consulted ,continue PPI BID . 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   Diabetes mellitus2 - continue home medications with sliding-scale coverage.  temporal arteritis on prednisone

## 2011-11-17 NOTE — ED Notes (Signed)
Patient back from CT and monitor applied

## 2011-11-17 NOTE — Progress Notes (Signed)
Nutrition Brief Note:  RD pulled to pt from nutrition risk assessment, pt indicated trouble swallowing. Pt states that she sometimes has problems, but currently is doing well. Pt states that she has more problems with solids then liquids. Cuts food small to help with the problem. Denies need for modified diet at this time. Denies recent weight loss.  No nutrition interventions at this time.  Body mass index is 39.19 kg/(m^2). Obesity.  Please consult if nutrition is needed.   Savannah Lindsey MARIE

## 2011-11-18 ENCOUNTER — Encounter (HOSPITAL_COMMUNITY): Payer: Self-pay | Admitting: *Deleted

## 2011-11-18 ENCOUNTER — Encounter (HOSPITAL_COMMUNITY)
Admission: EM | Discharge status: Against Medical Advice | Payer: Self-pay | Source: Ambulatory Visit | Attending: Emergency Medicine

## 2011-11-18 DIAGNOSIS — R0602 Shortness of breath: Secondary | ICD-10-CM

## 2011-11-18 DIAGNOSIS — N19 Unspecified kidney failure: Secondary | ICD-10-CM

## 2011-11-18 DIAGNOSIS — I10 Essential (primary) hypertension: Secondary | ICD-10-CM

## 2011-11-18 DIAGNOSIS — K317 Polyp of stomach and duodenum: Secondary | ICD-10-CM | POA: Diagnosis present

## 2011-11-18 DIAGNOSIS — K3189 Other diseases of stomach and duodenum: Secondary | ICD-10-CM | POA: Diagnosis present

## 2011-11-18 DIAGNOSIS — D131 Benign neoplasm of stomach: Secondary | ICD-10-CM

## 2011-11-18 DIAGNOSIS — R112 Nausea with vomiting, unspecified: Secondary | ICD-10-CM

## 2011-11-18 DIAGNOSIS — R079 Chest pain, unspecified: Secondary | ICD-10-CM

## 2011-11-18 HISTORY — PX: ESOPHAGOGASTRODUODENOSCOPY: SHX5428

## 2011-11-18 LAB — BASIC METABOLIC PANEL
BUN: 39 mg/dL — ABNORMAL HIGH (ref 6–23)
Calcium: 8.4 mg/dL (ref 8.4–10.5)
GFR calc non Af Amer: 24 mL/min — ABNORMAL LOW (ref >90)
Glucose, Bld: 242 mg/dL — ABNORMAL HIGH (ref 70–99)

## 2011-11-18 LAB — CARDIAC PANEL(CRET KIN+CKTOT+MB+TROPI)
CK, MB: 1.3 ng/mL (ref 0.3–4.0)
Total CK: 24 U/L (ref 7–177)

## 2011-11-18 LAB — GLUCOSE, CAPILLARY: Glucose-Capillary: 260 mg/dL — ABNORMAL HIGH (ref 70–99)

## 2011-11-18 LAB — CBC
HCT: 25.9 % — ABNORMAL LOW (ref 36.0–46.0)
Hemoglobin: 8.2 g/dL — ABNORMAL LOW (ref 12.0–15.0)
MCH: 26.5 pg (ref 26.0–34.0)
MCHC: 31.7 g/dL (ref 30.0–36.0)

## 2011-11-18 SURGERY — EGD (ESOPHAGOGASTRODUODENOSCOPY)
Anesthesia: Moderate Sedation

## 2011-11-18 MED ORDER — SODIUM CHLORIDE 0.9 % IV SOLN
INTRAVENOUS | Status: DC
  Administered 2011-11-18: 250 mL via INTRAVENOUS

## 2011-11-18 MED ORDER — FENTANYL CITRATE 0.05 MG/ML IJ SOLN
INTRAMUSCULAR | Status: AC
  Filled 2011-11-18: qty 2

## 2011-11-18 MED ORDER — METOCLOPRAMIDE HCL 5 MG PO TABS
5.0000 mg | ORAL_TABLET | Freq: Three times a day (TID) | ORAL | Status: DC
  Filled 2011-11-18 (×3): qty 1

## 2011-11-18 MED ORDER — DIPHENHYDRAMINE HCL 50 MG/ML IJ SOLN
INTRAMUSCULAR | Status: AC
Start: 2011-11-18 — End: 2011-11-18
  Filled 2011-11-18: qty 1

## 2011-11-18 MED ORDER — FENTANYL NICU IV SYRINGE 50 MCG/ML
INJECTION | INTRAMUSCULAR | Status: DC | PRN
  Administered 2011-11-18: 25 ug via INTRAVENOUS
  Administered 2011-11-18: 12.5 ug via INTRAVENOUS

## 2011-11-18 MED ORDER — MIDAZOLAM HCL 10 MG/2ML IJ SOLN
INTRAMUSCULAR | Status: DC | PRN
  Administered 2011-11-18 (×2): 2 mg via INTRAVENOUS

## 2011-11-18 MED ORDER — MIDAZOLAM HCL 10 MG/2ML IJ SOLN
INTRAMUSCULAR | Status: AC
  Filled 2011-11-18: qty 2

## 2011-11-18 MED ORDER — BUTAMBEN-TETRACAINE-BENZOCAINE 2-2-14 % EX AERO
INHALATION_SPRAY | CUTANEOUS | Status: DC | PRN
  Administered 2011-11-18: 2 via TOPICAL

## 2011-11-18 NOTE — Op Note (Addendum)
Savannah Lindsey [facility] [address] [place], [place]  [address]  ENDOSCOPY PROCEDURE REPORT  PATIENT:  Savannah, Lindsey  MR#:  [id number] BIRTHDATE:  12/01/1945, Savannah Lindsey  GENDER:  female  ENDOSCOPIST:  Iva Boop, MD, [facility] Referred by:  [facility]  PROCEDURE DATE:  11/18/2011 PROCEDURE:  EGD with biopsy, [address], Elease Hashimoto Dilation of Esophagus ASA CLASS:  Class III INDICATIONS:  dysphagia, chest pain, epigastric pain  MEDICATIONS:   Fentanyl 37.5 mcg IV, Versed 3 mg IV TOPICAL ANESTHETIC:  Cetacaine Spray  DESCRIPTION OF PROCEDURE:   After the risks benefits and alternatives of the procedure were thoroughly explained, informed consent was obtained.  The EG-[id number] ([id number]) endoscope was introduced through the mouth and advanced to the second portion of the duodenum, without limitations.  The instrument was slowly withdrawn as the mucosa was fully examined. Photos not possible due to malfunction.  Four polyps were found in the antrum. Sessile, up to 1 cm fleshy polyps in pre-pyloric antrum. Multiple biopsies were obtained and sent to pathology.  There was also a stenosis at the gastroesophageal junction. Some fod retention in stomach that impaired views of proximal stomach sligtly.   Retroflexed views revealed no abnormalities.    The scope was then withdrawn from the patient, a 72 [place] Maloney dilator passed without much resistance and no heme,  and the procedure completed.  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 gastroparsis - possibly triggered by high dose steroids and higer gluoses 3) Otherwise normal RECOMMENDATIONS: 1) post-dilation diet today - clears then soft (orderd) 2) I will notify re: pathology results and arrange follow-up including any office visit and colonoscopy  because of iron-deficiency anemia 3) Gastropareis diet instruction wll be needed - have ordered though she is post-sedation so ? how effective, can follow-up at office - I think she is refluxing due to poor motility - will dd metaclopramde 5 mg qac - do not write for more than 1 refill at dc so she can be reassessed in office for side effects before chronic rx issued, if needed 4) ok for dc by GI - follow post-sedation guidelines  Addendum: She also needs to wear her dentures onsistently  Iva Boop, MD, [facility]  CC:  Lonie Peak, PA  n. REVISED:  11/18/2011 09:03 AM eSIGNED:   Iva Boop at 11/18/2011 09:03 AM  Theresia Bough, [phone]

## 2011-11-18 NOTE — Discharge Summary (Addendum)
Patient ID: Savannah Lindsey MRN: [id number] DOB/AGE: 12/07/1945 66 y.o.  Admit date: 11/16/2011 Discharge date: 11/18/2011  Primary Care Physician:  Lonie Peak, [place], PA   Patient was not discharged, she signed herself against medical advise.   Present on Admission:  . atypical Chest pain Acute on chronic dyspnea  .Iron deficiency anemia, unspecified .Renal disease  .Temporal arteritis .HTN (hypertension) .Type II or unspecified type diabetes mellitus without mention of complication, not stated as uncontrolled .Esophageal dysphagia .Epigastric pain .Gastric dysmotility .Gastric polyps    Inpatient medications list Medication List  As of 11/18/2011 12:11 PM   ASK your doctor about these medications         amLODipine 5 MG tablet   Commonly known as: NORVASC   Take 5 mg by mouth every morning.      atorvastatin 40 MG tablet   Commonly known as: LIPITOR   Take 40 mg by mouth every morning.      insulin detemir 100 UNIT/ML injection   Commonly known as: LEVEMIR   Inject 38 Units into the skin at bedtime.      loratadine 10 MG tablet   Commonly known as: CLARITIN   Take 10 mg by mouth every morning.            insulin sliding scale 0-9 units before meals as per scale          oxyCODONE-acetaminophen 5-325 MG per tablet   Commonly known as: PERCOCET   Take 1 tablet by mouth 4 (four) times daily as needed. For headaches      pantoprazole 40 MG tablet   Commonly known as: PROTONIX   Take 40 mg by mouth 2 times daily before meals       predniSONE 20 MG tablet   Commonly known as: DELTASONE   Take 60 mg by mouth every morning.          When necessary medications: Tylenol, IV hydralazine, by mouth Zofran, IV Zofran   Consults: GI   Significant Diagnostic Studies:  Ct Abdomen Pelvis Wo Contrast  11/17/2011  *RADIOLOGY REPORT*  Clinical Data: Mid upper abdominal pain and nausea.  No IV contrast material was given due to renal insufficiency.  CT ABDOMEN AND  PELVIS WITHOUT CONTRAST  Technique:  Multidetector CT imaging of the abdomen and pelvis was performed following the standard protocol without intravenous contrast.  Comparison: CT chest 02/23/2009  Findings: Fibrosis or atelectasis in the lung bases.  Contrast material in the esophagus may represent reflux or dysmotility.  Surgical absence of the gallbladder.  The unenhanced appearance of the liver, spleen, pancreas, adrenal glands, kidneys, abdominal aorta, and retroperitoneal lymph nodes is unremarkable.  The stomach, small bowel, and colon are not distended. Small diverticulum in the third portion of the duodenum.  Stool fills the colon.  No colonic wall thickening.  Colonic diverticulosis.  The no free air or free fluid in the abdomen.  Prominent visceral adipose tissues.  Pelvis:  Surgical absence of the uterus.  Bladder wall is not thickened.  Diverticulosis of the sigmoid colon without inflammatory changes.  The appendix is normal.  No free or loculated pelvic fluid collections.  Surgical clips in the pelvis. No abnormal adnexal masses.  No significant pelvic lymphadenopathy. Normal alignment of the lumbar vertebrae.  IMPRESSION: No focal acute process demonstrated in the abdomen or pelvis.  No abnormality is identified to account for the patient's symptoms.  Original Report Authenticated By: Marlon Pel, M.D.   Dg Chest 2 View  11/16/2011  *RADIOLOGY REPORT*  Clinical Data: Cough.  Shortness of breath.  Weakness. Hypertension.  COPD.  CHEST - 2 VIEW  Comparison: 02/28/2009  Findings: Borderline cardiomegaly noted with prominent epicardial adipose tissue.  The lungs appear clear.  No pleural effusion observed.  IMPRESSION:  1.  Borderline cardiomegaly.   Otherwise, no significant abnormality identified.  Original Report Authenticated By: Dellia Cloud, M.D.   Nm Pulmonary Per & Vent  11/17/2011  *RADIOLOGY REPORT*  Clinical Data: Shortness of breath.  NM PULMONARY VENTILATION AND PERFUSION  SCAN  Radiopharmaceutical: CURIE MAA TECHNETIUM TO 64M ALBUMIN AGGREGATED, CURIE xenon xe 133 gas 20 milli Curie XENON XE 133 GAS  Comparison: Chest x-ray 11/16/2011  Findings: Initial perfusion imaging demonstrated a medium sized perfusion defect in the left base on the LPO projection. Ventilation imaging was therefore performed in the LPO and RAO projection.  No ventilation defects noted in this area.  Therefore this is a medium sized VQ mismatch.  No other VQ mismatches.  IMPRESSION: Single medium sized VQ mismatch in the left lung base.  Study is intermediate/indeterminate for pulmonary embolus.  Original Report Authenticated By: Cyndie Chime, M.D.   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 gastroparsis - possibly triggered by  high dose steroids and higer gluoses  3) Otherwise normal  RECOMMENDATIONS:  1) post-dilation diet today - clears then soft (orderd)  2) I will notify re: pathology results and arrange follow-up  including any office visit and colonoscopy because of  iron-deficiency anemia  3) Gastropareis diet instruction wll be needed - have ordered  though she is post-sedation so ? how effective, can follow-up at  office - I think she is refluxing due to poor motility - will dd  metaclopramde 5 mg qac - do not write for more than 1 refill at dc  so she can be reassessed in office for side effects before chronic  rx issued, if needed   Brief H and P: For complete details please refer to admission H and P, but in brief  66 year-old female who was diagnosed with temporal arteritis last month at [facility] has been experiencing chest pain since last evening. The chest pain is burning in sensation and constant. Denies any radiation or exertional symptoms. Patient has mild shortness of breath on exertion which  patient states it is not new. In addition patient has been noticing increasing swelling of her extremities and abdomen. Patient has been on steroids for last one month for her temporal arteritis. CT abdomen pelvis done in the ER does not show anything acute. In addition patient is found to have renal failure and anemia. Patient does not recall having anemia previously. Stool for occult blood is negative. Patient will be admitted for further observation.   Hospital Course:  Atypical chest pain  Description seems like GERD ,cardiac enzymes negative x 4 GERD:  In the setting of recent steroid therapy , patient was treated with PPI twice a day GI service was consulted and she underwent EGD and dilation today. Please see report above  Acute on chronic  Dyspnea/B/L LE edema:  CXR showed cardiomegaly but no pulmonary edema,Pro BNP slightly elevated ,2 D echo was done and showed EF of 65-70%, grade 1 diastolic dysfunction and no pericardial effusion. -V/Q scan showed single medium size  V/Q mismatch in the left lung base with intermediate/indeterminant probability of PE. I discussed this result with the patient in details and explained my plans to get bilateral lower extremity Dopplers to rule out DVT and check d-dimer and evaluate her  probability for having PE. I also explained to her because of her renal insufficiency and trying to avoid IV contrast. Risks of possibilities of having pulmonary embolism including death was explained to patient and she verbalized understanding however she refused any further testing and requested to be discharged to home now. Patient is alert and oriented x3 and answers questions appropriately. She is competent to make in her own decision. She is advised to comply with my treatment plan however decided to leave against medical advise understanding all the risk. Iron deficiency anemia:  Stool negative for occult blood   seen by GI  and plans with the follow with them in the  office as an outpatient for further workup.   SPEP/UPEP However results are pending at this time and need to be followed as an outpatient.  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  Hypertension - she was  Continued on her home  medications except for holding off Diovan until we assure her creatinine is not worsening.  Diabetes mellitus2 - continued on  home medications with sliding-scale coverage.  temporal arteritis  Continued on prednisone   Physical exam  Filed Vitals:   11/18/11 0911  BP: 139/79  Pulse:   Temp:   Resp: 16    General: Alert, awake, oriented x3, in no acute distress. Heart: Regular rate and rhythm, without murmurs, rubs, gallops. Lungs: Clear to auscultation bilaterally. Abdomen: Soft, nontender, nondistended, positive bowel sounds. Extremities:  2+ pedal edema bilaterally  Neuro: Grossly intact, nonfocal.   Disposition and Follow-up: Patient is signing herself against advice, please see above. Patient was advised to follow with her PCP ASAP and with GI as scheduled. She verbalized understanding.    Time spent on Discharge: Approximately 40 minutes   Signed: Dorn Hartshorne 11/18/2011, 12:11 PM

## 2011-11-18 NOTE — Progress Notes (Signed)
Pt returned to rm [id number] from endoscopy. Pt A&O. VS WNL. Pt denied pain or concerns. Will continue to monitor pt closely.  Juliane Lack, RN

## 2011-11-18 NOTE — Progress Notes (Signed)
Pt leaving hospital AMA.  Pt is A&O x 4. Family with patient. IV dc'd. MD discussed risks with pt and pt is aware of risks associated with leaving the hospital AMA.   Juliane Lack, RN

## 2011-11-20 ENCOUNTER — Encounter (HOSPITAL_COMMUNITY): Payer: Self-pay | Admitting: Internal Medicine

## 2011-11-20 LAB — GLUCOSE, CAPILLARY
Glucose-Capillary: 167 mg/dL — ABNORMAL HIGH (ref 70–99)
Glucose-Capillary: 188 mg/dL — ABNORMAL HIGH (ref 70–99)

## 2011-11-21 LAB — UIFE/LIGHT CHAINS/TP QN, 24-HR UR
Free Kappa Lt Chains,Ur: 1.98 mg/dL (ref 0.14–2.42)
Free Kappa/Lambda Ratio: 11 ratio — ABNORMAL HIGH (ref 2.04–10.37)
Free Lambda Lt Chains,Ur: 0.18 mg/dL (ref 0.02–0.67)
Gamma Globulin, Urine: DETECTED — AB
Total Protein, Urine: 3.2 mg/dL

## 2011-11-22 NOTE — Progress Notes (Signed)
Quick Note:  Has a Helicobacter heilmanii infection. Treatment will make sense but will not do over phone. Needs an REV to talk about this, reassess and arrange colonoscopy June is ok but could be sooner if available ______

## 2011-11-23 LAB — PROTEIN ELECTROPHORESIS, SERUM
Albumin ELP: 55.9 % (ref 55.8–66.1)
M-Spike, %: NOT DETECTED g/dL
Total Protein ELP: 5.4 g/dL — ABNORMAL LOW (ref 6.0–8.3)

## 2011-12-14 ENCOUNTER — Ambulatory Visit: Payer: Medicare Other | Admitting: Internal Medicine

## 2012-05-31 ENCOUNTER — Emergency Department (HOSPITAL_COMMUNITY)
Admission: EM | Admit: 2012-05-31 | Discharge: 2012-05-31 | Disposition: A | Discharge status: Home / Self Care | Payer: Medicare Other | Attending: Emergency Medicine | Admitting: Emergency Medicine

## 2012-05-31 ENCOUNTER — Encounter (HOSPITAL_COMMUNITY): Payer: Self-pay | Admitting: Emergency Medicine

## 2012-05-31 DIAGNOSIS — K219 Gastro-esophageal reflux disease without esophagitis: Secondary | ICD-10-CM | POA: Insufficient documentation

## 2012-05-31 DIAGNOSIS — S7010XA Contusion of unspecified thigh, initial encounter: Secondary | ICD-10-CM | POA: Insufficient documentation

## 2012-05-31 DIAGNOSIS — S40019A Contusion of unspecified shoulder, initial encounter: Secondary | ICD-10-CM

## 2012-05-31 DIAGNOSIS — Z8679 Personal history of other diseases of the circulatory system: Secondary | ICD-10-CM | POA: Insufficient documentation

## 2012-05-31 DIAGNOSIS — N189 Chronic kidney disease, unspecified: Secondary | ICD-10-CM | POA: Insufficient documentation

## 2012-05-31 DIAGNOSIS — E7800 Pure hypercholesterolemia, unspecified: Secondary | ICD-10-CM | POA: Insufficient documentation

## 2012-05-31 DIAGNOSIS — W1811XA Fall from or off toilet without subsequent striking against object, initial encounter: Secondary | ICD-10-CM | POA: Insufficient documentation

## 2012-05-31 DIAGNOSIS — H5440 Blindness, one eye, unspecified eye: Secondary | ICD-10-CM | POA: Insufficient documentation

## 2012-05-31 DIAGNOSIS — J4489 Other specified chronic obstructive pulmonary disease: Secondary | ICD-10-CM | POA: Insufficient documentation

## 2012-05-31 DIAGNOSIS — Y92009 Unspecified place in unspecified non-institutional (private) residence as the place of occurrence of the external cause: Secondary | ICD-10-CM | POA: Insufficient documentation

## 2012-05-31 DIAGNOSIS — Z23 Encounter for immunization: Secondary | ICD-10-CM | POA: Insufficient documentation

## 2012-05-31 DIAGNOSIS — I129 Hypertensive chronic kidney disease with stage 1 through stage 4 chronic kidney disease, or unspecified chronic kidney disease: Secondary | ICD-10-CM | POA: Insufficient documentation

## 2012-05-31 DIAGNOSIS — J449 Chronic obstructive pulmonary disease, unspecified: Secondary | ICD-10-CM | POA: Insufficient documentation

## 2012-05-31 DIAGNOSIS — Z79899 Other long term (current) drug therapy: Secondary | ICD-10-CM | POA: Insufficient documentation

## 2012-05-31 DIAGNOSIS — Z8719 Personal history of other diseases of the digestive system: Secondary | ICD-10-CM | POA: Insufficient documentation

## 2012-05-31 DIAGNOSIS — K222 Esophageal obstruction: Secondary | ICD-10-CM | POA: Insufficient documentation

## 2012-05-31 DIAGNOSIS — S8010XA Contusion of unspecified lower leg, initial encounter: Secondary | ICD-10-CM

## 2012-05-31 DIAGNOSIS — Z8739 Personal history of other diseases of the musculoskeletal system and connective tissue: Secondary | ICD-10-CM | POA: Insufficient documentation

## 2012-05-31 DIAGNOSIS — E119 Type 2 diabetes mellitus without complications: Secondary | ICD-10-CM | POA: Insufficient documentation

## 2012-05-31 DIAGNOSIS — Z8709 Personal history of other diseases of the respiratory system: Secondary | ICD-10-CM | POA: Insufficient documentation

## 2012-05-31 DIAGNOSIS — Z862 Personal history of diseases of the blood and blood-forming organs and certain disorders involving the immune mechanism: Secondary | ICD-10-CM | POA: Insufficient documentation

## 2012-05-31 DIAGNOSIS — Z85828 Personal history of other malignant neoplasm of skin: Secondary | ICD-10-CM | POA: Insufficient documentation

## 2012-05-31 DIAGNOSIS — Z8673 Personal history of transient ischemic attack (TIA), and cerebral infarction without residual deficits: Secondary | ICD-10-CM | POA: Insufficient documentation

## 2012-05-31 DIAGNOSIS — S0990XA Unspecified injury of head, initial encounter: Secondary | ICD-10-CM | POA: Insufficient documentation

## 2012-05-31 DIAGNOSIS — Z794 Long term (current) use of insulin: Secondary | ICD-10-CM | POA: Insufficient documentation

## 2012-05-31 DIAGNOSIS — Z8701 Personal history of pneumonia (recurrent): Secondary | ICD-10-CM | POA: Insufficient documentation

## 2012-05-31 DIAGNOSIS — Y9389 Activity, other specified: Secondary | ICD-10-CM | POA: Insufficient documentation

## 2012-05-31 MED ORDER — TETANUS-DIPHTH-ACELL PERTUSSIS 5-2.5-18.5 LF-MCG/0.5 IM SUSP
0.5000 mL | Freq: Once | INTRAMUSCULAR | Status: AC
  Administered 2012-05-31: 0.5 mL via INTRAMUSCULAR
  Filled 2012-05-31: qty 0.5

## 2012-05-31 NOTE — ED Notes (Signed)
Pt was at a [employer] store restroom when the toilet seat slipped sideways, pinching her left inner thigh. Pt has small bruise to left inner thigh.

## 2012-05-31 NOTE — ED Provider Notes (Signed)
History   This chart was scribed for Benny Lennert, MD by Sofie Rower, ED Scribe. The patient was seen in room TR11C/TR11C and the patient's care was started at 11:41AM.     CSN: [id number]  Arrival date & time 05/31/12  1040   First MD Initiated Contact with Patient 05/31/12 1141      Chief Complaint  Patient presents with  . Leg Pain    (Consider location/radiation/quality/duration/timing/severity/associated sxs/prior treatment) Patient is a 66 y.o. female presenting with leg pain and fall. The history is provided by the patient. No language interpreter was used.  Leg Pain  The incident occurred 1 to 2 hours ago. The incident occurred at home. The injury mechanism was compression. The pain is present in the left thigh. The pain is moderate. The pain has been constant since onset. Pertinent negatives include no loss of sensation. She reports no foreign bodies present. Nothing aggravates the symptoms. She has tried nothing for the symptoms. The treatment provided no relief.  Fall The accident occurred 1 to 2 hours ago. The fall occurred from a stool. She fell from an unknown height. She landed on a hard floor. There was no blood loss. The point of impact was the head and left shoulder. The pain is present in the head and left shoulder. The pain is moderate. She was ambulatory at the scene. There was no entrapment after the fall. There was no drug use involved in the accident. There was no alcohol use involved in the accident. Pertinent negatives include no abdominal pain, no hematuria, no headaches and no loss of consciousness. She has tried nothing for the symptoms. The treatment provided no relief.    PCP is Dr. Anna Genre.   Past Medical History  Diagnosis Date  . Diabetes mellitus   . Hypertension   . COPD (chronic obstructive pulmonary disease)   . GERD (gastroesophageal reflux disease)   . Hypercholesteremia   . Anxiety   . Stroke   . Headaches due to old head injury   . Blind  one eye   . Angina   . Shortness of breath   . Pneumonia   . Recurrent upper respiratory infection (URI)   . Anemia   . Arthritis   . Complication of anesthesia     difficulty waking up  . Chronic kidney disease   . Cancer     skin cancer right thumb  . Temporal arteritis 11/17/2011  . Gastric polyp     antral  . Stenosis of esophagus     Past Surgical History  Procedure Date  . Abdominal hysterectomy   . Cholecystectomy   . Heel spur surgery     bilateral  . Brain surgery   . Esophagogastroduodenoscopy 11/18/2011    Procedure: ESOPHAGOGASTRODUODENOSCOPY (EGD);  Surgeon: Iva Boop, MD;  Location: [facility] ENDOSCOPY;  Service: Endoscopy;  Laterality: N/A;    Family History  Problem Relation Age of Onset  . Colon cancer      History  Substance Use Topics  . Smoking status: Never Smoker   . Smokeless tobacco: Never Used  . Alcohol Use: No    OB History    Grav Para Term Preterm Abortions TAB SAB Ect Mult Living                  Review of Systems  Constitutional: Negative for fatigue.  HENT: Negative for congestion, sinus pressure and ear discharge.   Eyes: Negative for discharge.  Respiratory: Negative for cough.  Cardiovascular: Negative for chest pain.  Gastrointestinal: Negative for abdominal pain and diarrhea.  Genitourinary: Negative for frequency and hematuria.  Musculoskeletal: Negative for back pain.  Skin: Negative for rash.  Neurological: Negative for seizures, loss of consciousness and headaches.  Hematological: Negative.   Psychiatric/Behavioral: Negative for hallucinations.    Allergies  Codeine  Home Medications   Current Outpatient Rx  Name  Route  Sig  Dispense  Refill  . AMLODIPINE BESYLATE 5 MG PO TABS   Oral   Take 5 mg by mouth every morning.          . ATORVASTATIN CALCIUM 40 MG PO TABS   Oral   Take 40 mg by mouth every morning.          . INSULIN DETEMIR 100 UNIT/ML [place] SOLN   Subcutaneous   Inject 22 Units into the  skin at bedtime.          Marland Kitchen LORATADINE 10 MG PO TABS   Oral   Take 10 mg by mouth every morning.          Marland Kitchen LOSARTAN POTASSIUM 50 MG PO TABS   Oral   Take 50 mg by mouth every morning.          Marland Kitchen PANTOPRAZOLE SODIUM 40 MG PO TBEC   Oral   Take 40 mg by mouth every morning.           BP 178/90  Pulse 86  Temp 97.6 F (36.4 C) (Oral)  Resp 22  SpO2 99%  Physical Exam  Nursing note and vitals reviewed. Constitutional: She is oriented to person, place, and time. She appears well-developed.  HENT:  Head: Normocephalic. Head is with abrasion.  Eyes: Conjunctivae normal and EOM are normal. No scleral icterus.  Neck: Neck supple. No tracheal deviation present. No thyromegaly present.  Cardiovascular: Normal rate and regular rhythm.  Exam reveals no gallop and no friction rub.   No murmur heard. Pulmonary/Chest: No stridor. She has no wheezes. She has no rales. She exhibits no tenderness.  Abdominal: She exhibits no distension. There is no tenderness. There is no rebound.  Musculoskeletal: Normal range of motion. She exhibits tenderness. She exhibits no edema.       Left shoulder: She exhibits tenderness.  Lymphadenopathy:    She has no cervical adenopathy.  Neurological: She is oriented to person, place, and time. Coordination normal.  Skin: Skin is warm. No rash noted. No erythema.       Small bruise to left thigh.   Psychiatric: She has a normal mood and affect. Her behavior is normal.    ED Course  Procedures (including critical care time)  DIAGNOSTIC STUDIES: Oxygen Saturation is 99% on room air, normal by my interpretation.    COORDINATION OF CARE:   11:46 AM- Treatment plan concerning pain management discussed with patient. Pt agrees with treatment.     Labs Reviewed - No data to display No results found.   No diagnosis found.    MDM        The chart was scribed for me under my direct supervision.  I personally performed the history,  physical, and medical decision making and all procedures in the evaluation of this patient.Benny Lennert, MD 05/31/12 1150

## 2012-05-31 NOTE — ED Notes (Signed)
Pt sts pinched upper leg with toilet lid today; pt sts burning and denies bleeding

## 2012-06-13 ENCOUNTER — Other Ambulatory Visit (HOSPITAL_COMMUNITY): Payer: Self-pay | Admitting: *Deleted

## 2012-06-14 ENCOUNTER — Encounter (HOSPITAL_COMMUNITY): Payer: Self-pay | Admitting: Emergency Medicine

## 2012-06-14 ENCOUNTER — Emergency Department (HOSPITAL_COMMUNITY): Payer: Medicare Other

## 2012-06-14 ENCOUNTER — Encounter (HOSPITAL_COMMUNITY)
Admission: RE | Admit: 2012-06-14 | Discharge: 2012-06-14 | Disposition: A | Discharge status: Home / Self Care | Payer: Medicare Other | Source: Ambulatory Visit | Attending: Nephrology | Admitting: Nephrology

## 2012-06-14 ENCOUNTER — Other Ambulatory Visit: Payer: Self-pay

## 2012-06-14 ENCOUNTER — Emergency Department (HOSPITAL_COMMUNITY)
Admission: EM | Admit: 2012-06-14 | Discharge: 2012-06-14 | Disposition: A | Discharge status: Home / Self Care | Payer: Medicare Other | Attending: Emergency Medicine | Admitting: Emergency Medicine

## 2012-06-14 DIAGNOSIS — N186 End stage renal disease: Secondary | ICD-10-CM | POA: Insufficient documentation

## 2012-06-14 DIAGNOSIS — D631 Anemia in chronic kidney disease: Secondary | ICD-10-CM | POA: Insufficient documentation

## 2012-06-14 DIAGNOSIS — R51 Headache: Secondary | ICD-10-CM | POA: Insufficient documentation

## 2012-06-14 DIAGNOSIS — E1129 Type 2 diabetes mellitus with other diabetic kidney complication: Secondary | ICD-10-CM | POA: Insufficient documentation

## 2012-06-14 DIAGNOSIS — R0602 Shortness of breath: Secondary | ICD-10-CM | POA: Insufficient documentation

## 2012-06-14 MED ORDER — SODIUM CHLORIDE 0.9 % IV SOLN
1020.0000 mg | Freq: Once | INTRAVENOUS | Status: DC
  Filled 2012-06-14: qty 34

## 2012-06-14 MED ORDER — SODIUM CHLORIDE 0.9 % IV SOLN: INTRAVENOUS | Status: DC

## 2012-06-14 NOTE — Progress Notes (Signed)
PT came in to short stay today very SOB and dizy stating she keeps feeling like she is gonna pass out.  Her son stated that last night she  had a bad headache and pains shooting up her neck and he was worried she was having another stroke.  I called the charge nurse in the ER and the pt was taken to the ER in an wheelchair.  I also called Joanette Gula at Dr. Deterding's office and let them know we sent her to the ER and that her Iron infusion will need to be rescheduled.

## 2012-06-14 NOTE — ED Notes (Signed)
Pt c/o dizziness upon standing and SOB; pt sts HA in back of head last night; pt in short stay supposed to get iron infusion today but sent down here instead

## 2012-06-20 ENCOUNTER — Encounter (HOSPITAL_COMMUNITY): Payer: Medicare Other

## 2012-06-21 ENCOUNTER — Encounter (HOSPITAL_COMMUNITY)
Admission: RE | Admit: 2012-06-21 | Discharge: 2012-06-21 | Disposition: A | Discharge status: Home / Self Care | Payer: Medicare Other | Source: Ambulatory Visit | Attending: Critical Care Medicine | Admitting: Critical Care Medicine

## 2012-06-21 MED ORDER — SODIUM CHLORIDE 0.9 % IV SOLN
1020.0000 mg | Freq: Once | INTRAVENOUS | Status: AC
  Administered 2012-06-21: 1020 mg via INTRAVENOUS
  Filled 2012-06-21: qty 34

## 2012-06-21 MED ORDER — EPOETIN ALFA 20000 UNIT/ML IJ SOLN
INTRAMUSCULAR | Status: AC
  Filled 2012-06-21: qty 1

## 2012-06-21 MED ORDER — SODIUM CHLORIDE 0.9 % IV SOLN
INTRAVENOUS | Status: DC
  Administered 2012-06-21: 10:00:00 via INTRAVENOUS

## 2012-06-21 MED ORDER — FERUMOXYTOL INJECTION 510 MG/17 ML
INTRAVENOUS | Status: AC
  Filled 2012-06-21: qty 17

## 2012-06-21 MED ORDER — EPOETIN ALFA 20000 UNIT/ML IJ SOLN
20000.0000 [IU] | INTRAMUSCULAR | Status: DC
  Administered 2012-06-21: 20000 [IU] via SUBCUTANEOUS

## 2012-07-05 ENCOUNTER — Encounter (HOSPITAL_COMMUNITY)
Admission: RE | Admit: 2012-07-05 | Discharge: 2012-07-05 | Disposition: A | Discharge status: Home / Self Care | Payer: Medicare Other | Source: Ambulatory Visit | Attending: Nephrology | Admitting: Nephrology

## 2012-07-05 DIAGNOSIS — E1129 Type 2 diabetes mellitus with other diabetic kidney complication: Secondary | ICD-10-CM | POA: Insufficient documentation

## 2012-07-05 DIAGNOSIS — D509 Iron deficiency anemia, unspecified: Secondary | ICD-10-CM | POA: Insufficient documentation

## 2012-07-05 DIAGNOSIS — N058 Unspecified nephritic syndrome with other morphologic changes: Secondary | ICD-10-CM | POA: Insufficient documentation

## 2012-07-05 LAB — POCT HEMOGLOBIN-HEMACUE: Hemoglobin: 9.4 g/dL — ABNORMAL LOW (ref 12.0–15.0)

## 2012-07-05 MED ORDER — EPOETIN ALFA 20000 UNIT/ML IJ SOLN
20000.0000 [IU] | INTRAMUSCULAR | Status: DC
  Administered 2012-07-05: 20000 [IU] via SUBCUTANEOUS

## 2012-07-05 MED ORDER — EPOETIN ALFA 20000 UNIT/ML IJ SOLN
INTRAMUSCULAR | Status: AC
  Administered 2012-07-05: 20000 [IU] via SUBCUTANEOUS
  Filled 2012-07-05: qty 1

## 2012-07-18 ENCOUNTER — Other Ambulatory Visit (HOSPITAL_COMMUNITY): Payer: Self-pay

## 2012-07-19 ENCOUNTER — Encounter (HOSPITAL_COMMUNITY)
Admission: RE | Admit: 2012-07-19 | Discharge: 2012-07-19 | Disposition: A | Discharge status: Home / Self Care | Payer: Medicare Other | Source: Ambulatory Visit | Attending: Nephrology | Admitting: Nephrology

## 2012-07-19 LAB — IRON AND TIBC
Iron: 61 ug/dL (ref 42–135)
Saturation Ratios: 27 % (ref 20–55)
UIBC: 167 ug/dL (ref 125–400)

## 2012-07-19 MED ORDER — EPOETIN ALFA 20000 UNIT/ML IJ SOLN
20000.0000 [IU] | INTRAMUSCULAR | Status: DC
  Administered 2012-07-19: 20000 [IU] via SUBCUTANEOUS

## 2012-08-02 ENCOUNTER — Encounter (HOSPITAL_COMMUNITY)
Admission: RE | Admit: 2012-08-02 | Discharge: 2012-08-02 | Disposition: A | Discharge status: Home / Self Care | Payer: Medicare Other | Source: Ambulatory Visit | Attending: Nephrology | Admitting: Nephrology

## 2012-08-02 MED ORDER — EPOETIN ALFA 20000 UNIT/ML IJ SOLN
INTRAMUSCULAR | Status: AC
  Filled 2012-08-02: qty 1

## 2012-08-02 MED ORDER — EPOETIN ALFA 20000 UNIT/ML IJ SOLN
20000.0000 [IU] | INTRAMUSCULAR | Status: DC
  Administered 2012-08-02: 20000 [IU] via SUBCUTANEOUS

## 2012-08-16 ENCOUNTER — Encounter (HOSPITAL_COMMUNITY): Payer: Medicare Other

## 2012-08-22 ENCOUNTER — Encounter (HOSPITAL_COMMUNITY)
Admission: RE | Admit: 2012-08-22 | Discharge: 2012-08-22 | Disposition: A | Discharge status: Home / Self Care | Payer: Medicare Other | Source: Ambulatory Visit | Attending: Nephrology | Admitting: Nephrology

## 2012-08-22 DIAGNOSIS — D631 Anemia in chronic kidney disease: Secondary | ICD-10-CM | POA: Insufficient documentation

## 2012-08-22 DIAGNOSIS — E1129 Type 2 diabetes mellitus with other diabetic kidney complication: Secondary | ICD-10-CM | POA: Insufficient documentation

## 2012-08-22 DIAGNOSIS — N058 Unspecified nephritic syndrome with other morphologic changes: Secondary | ICD-10-CM | POA: Insufficient documentation

## 2012-08-22 DIAGNOSIS — N186 End stage renal disease: Secondary | ICD-10-CM | POA: Insufficient documentation

## 2012-08-22 LAB — IRON AND TIBC
Saturation Ratios: 24 % (ref 20–55)
UIBC: 206 ug/dL (ref 125–400)

## 2012-08-22 MED ORDER — EPOETIN ALFA 20000 UNIT/ML IJ SOLN
INTRAMUSCULAR | Status: AC
  Administered 2012-08-22: 20000 [IU] via SUBCUTANEOUS
  Filled 2012-08-22: qty 1

## 2012-08-22 MED ORDER — EPOETIN ALFA 20000 UNIT/ML IJ SOLN
20000.0000 [IU] | INTRAMUSCULAR | Status: DC
  Administered 2012-08-22: 20000 [IU] via SUBCUTANEOUS

## 2012-09-05 ENCOUNTER — Encounter (HOSPITAL_COMMUNITY)
Admission: RE | Admit: 2012-09-05 | Discharge: 2012-09-05 | Disposition: A | Discharge status: Home / Self Care | Payer: Medicare Other | Source: Ambulatory Visit | Attending: Nephrology | Admitting: Nephrology

## 2012-09-05 DIAGNOSIS — E1129 Type 2 diabetes mellitus with other diabetic kidney complication: Secondary | ICD-10-CM | POA: Insufficient documentation

## 2012-09-05 DIAGNOSIS — N058 Unspecified nephritic syndrome with other morphologic changes: Secondary | ICD-10-CM | POA: Insufficient documentation

## 2012-09-05 DIAGNOSIS — D509 Iron deficiency anemia, unspecified: Secondary | ICD-10-CM | POA: Insufficient documentation

## 2012-09-05 LAB — POCT HEMOGLOBIN-HEMACUE: Hemoglobin: 11.6 g/dL — ABNORMAL LOW (ref 12.0–15.0)

## 2012-09-05 MED ORDER — EPOETIN ALFA 20000 UNIT/ML IJ SOLN
20000.0000 [IU] | INTRAMUSCULAR | Status: DC
  Administered 2012-09-05: 20000 [IU] via SUBCUTANEOUS

## 2012-09-05 MED ORDER — EPOETIN ALFA 20000 UNIT/ML IJ SOLN
INTRAMUSCULAR | Status: AC
  Administered 2012-09-05: 20000 [IU] via SUBCUTANEOUS
  Filled 2012-09-05: qty 1

## 2012-09-18 ENCOUNTER — Other Ambulatory Visit (HOSPITAL_COMMUNITY): Payer: Self-pay | Admitting: *Deleted

## 2012-09-19 ENCOUNTER — Encounter (HOSPITAL_COMMUNITY)
Admission: RE | Admit: 2012-09-19 | Discharge: 2012-09-19 | Disposition: A | Discharge status: Home / Self Care | Payer: Medicare Other | Source: Ambulatory Visit | Attending: Nephrology | Admitting: Nephrology

## 2012-09-19 LAB — IRON AND TIBC
Iron: 41 ug/dL — ABNORMAL LOW (ref 42–135)
TIBC: 242 ug/dL — ABNORMAL LOW (ref 250–470)

## 2012-09-19 MED ORDER — EPOETIN ALFA 20000 UNIT/ML IJ SOLN: 20000.0000 [IU] | INTRAMUSCULAR | Status: DC

## 2012-09-19 MED ORDER — EPOETIN ALFA 20000 UNIT/ML IJ SOLN
INTRAMUSCULAR | Status: AC
  Administered 2012-09-19: 20000 [IU] via SUBCUTANEOUS
  Filled 2012-09-19: qty 1

## 2012-10-01 ENCOUNTER — Other Ambulatory Visit (HOSPITAL_COMMUNITY): Payer: Self-pay | Admitting: *Deleted

## 2012-10-02 ENCOUNTER — Encounter (HOSPITAL_COMMUNITY): Payer: Medicare Other

## 2012-10-17 ENCOUNTER — Encounter (HOSPITAL_COMMUNITY): Payer: Medicare Other

## 2012-10-24 ENCOUNTER — Encounter (HOSPITAL_COMMUNITY)
Admission: RE | Admit: 2012-10-24 | Discharge: 2012-10-24 | Disposition: A | Discharge status: Home / Self Care | Payer: Medicare Other | Source: Ambulatory Visit | Attending: Nephrology | Admitting: Nephrology

## 2012-10-24 DIAGNOSIS — N058 Unspecified nephritic syndrome with other morphologic changes: Secondary | ICD-10-CM | POA: Insufficient documentation

## 2012-10-24 DIAGNOSIS — E1129 Type 2 diabetes mellitus with other diabetic kidney complication: Secondary | ICD-10-CM | POA: Insufficient documentation

## 2012-10-24 DIAGNOSIS — D509 Iron deficiency anemia, unspecified: Secondary | ICD-10-CM | POA: Insufficient documentation

## 2012-10-24 LAB — IRON AND TIBC
Saturation Ratios: 23 % (ref 20–55)
TIBC: 231 ug/dL — ABNORMAL LOW (ref 250–470)
UIBC: 178 ug/dL (ref 125–400)

## 2012-10-24 MED ORDER — EPOETIN ALFA 20000 UNIT/ML IJ SOLN
INTRAMUSCULAR | Status: AC
  Filled 2012-10-24: qty 1

## 2012-10-24 MED ORDER — EPOETIN ALFA 20000 UNIT/ML IJ SOLN
20000.0000 [IU] | INTRAMUSCULAR | Status: DC
  Administered 2012-10-24: 20000 [IU] via SUBCUTANEOUS

## 2012-10-24 MED ORDER — FERUMOXYTOL INJECTION 510 MG/17 ML
INTRAVENOUS | Status: AC
  Administered 2012-10-24: 510 mg via INTRAVENOUS
  Filled 2012-10-24: qty 17

## 2012-10-24 MED ORDER — FERUMOXYTOL INJECTION 510 MG/17 ML: 510.0000 mg | Freq: Once | INTRAVENOUS | Status: AC

## 2012-10-24 MED ORDER — SODIUM CHLORIDE 0.9 % IV SOLN
Freq: Once | INTRAVENOUS | Status: AC
  Administered 2012-10-24: 10:00:00 via INTRAVENOUS

## 2012-11-21 ENCOUNTER — Encounter (HOSPITAL_COMMUNITY): Payer: Medicare Other

## 2012-11-29 ENCOUNTER — Encounter (HOSPITAL_COMMUNITY)
Admission: RE | Admit: 2012-11-29 | Discharge: 2012-11-29 | Disposition: A | Discharge status: Home / Self Care | Payer: Medicare Other | Source: Ambulatory Visit | Attending: Nephrology | Admitting: Nephrology

## 2012-11-29 DIAGNOSIS — D509 Iron deficiency anemia, unspecified: Secondary | ICD-10-CM | POA: Insufficient documentation

## 2012-11-29 LAB — POCT HEMOGLOBIN-HEMACUE: Hemoglobin: 10.9 g/dL — ABNORMAL LOW (ref 12.0–15.0)

## 2012-11-29 LAB — IRON AND TIBC
Iron: 47 ug/dL (ref 42–135)
TIBC: 240 ug/dL — ABNORMAL LOW (ref 250–470)

## 2012-11-29 MED ORDER — EPOETIN ALFA 20000 UNIT/ML IJ SOLN
INTRAMUSCULAR | Status: AC
  Filled 2012-11-29: qty 1

## 2012-11-29 MED ORDER — EPOETIN ALFA 20000 UNIT/ML IJ SOLN
20000.0000 [IU] | INTRAMUSCULAR | Status: DC
  Administered 2012-11-29: 20000 [IU] via SUBCUTANEOUS

## 2012-12-12 ENCOUNTER — Other Ambulatory Visit (HOSPITAL_COMMUNITY): Payer: Self-pay | Admitting: *Deleted

## 2012-12-13 ENCOUNTER — Encounter (HOSPITAL_COMMUNITY)
Admission: RE | Admit: 2012-12-13 | Discharge: 2012-12-13 | Disposition: A | Discharge status: Home / Self Care | Payer: Medicare Other | Source: Ambulatory Visit | Attending: Nephrology | Admitting: Nephrology

## 2012-12-13 DIAGNOSIS — N039 Chronic nephritic syndrome with unspecified morphologic changes: Secondary | ICD-10-CM | POA: Insufficient documentation

## 2012-12-13 DIAGNOSIS — D631 Anemia in chronic kidney disease: Secondary | ICD-10-CM | POA: Insufficient documentation

## 2012-12-13 DIAGNOSIS — E1129 Type 2 diabetes mellitus with other diabetic kidney complication: Secondary | ICD-10-CM | POA: Insufficient documentation

## 2012-12-13 DIAGNOSIS — N186 End stage renal disease: Secondary | ICD-10-CM | POA: Insufficient documentation

## 2012-12-13 LAB — POCT HEMOGLOBIN-HEMACUE: Hemoglobin: 11.1 g/dL — ABNORMAL LOW (ref 12.0–15.0)

## 2012-12-13 MED ORDER — FERUMOXYTOL INJECTION 510 MG/17 ML
INTRAVENOUS | Status: AC
  Administered 2012-12-13: 510 mg via INTRAVENOUS
  Filled 2012-12-13: qty 17

## 2012-12-13 MED ORDER — FERUMOXYTOL INJECTION 510 MG/17 ML: 510.0000 mg | Freq: Once | INTRAVENOUS | Status: AC

## 2012-12-13 MED ORDER — SODIUM CHLORIDE 0.9 % IV SOLN
Freq: Once | INTRAVENOUS | Status: AC
  Administered 2012-12-13: 11:00:00 via INTRAVENOUS

## 2012-12-13 MED ORDER — EPOETIN ALFA 20000 UNIT/ML IJ SOLN: 20000.0000 [IU] | INTRAMUSCULAR | Status: DC

## 2012-12-27 ENCOUNTER — Encounter (HOSPITAL_COMMUNITY)
Admission: RE | Admit: 2012-12-27 | Discharge: 2012-12-27 | Disposition: A | Discharge status: Home / Self Care | Payer: Medicare Other | Source: Ambulatory Visit | Attending: Nephrology | Admitting: Nephrology

## 2012-12-27 ENCOUNTER — Encounter (HOSPITAL_COMMUNITY): Payer: Medicare Other

## 2012-12-27 LAB — IRON AND TIBC
Saturation Ratios: 29 % (ref 20–55)
UIBC: 142 ug/dL (ref 125–400)

## 2012-12-27 MED ORDER — EPOETIN ALFA 20000 UNIT/ML IJ SOLN: 20000.0000 [IU] | INTRAMUSCULAR | Status: DC

## 2012-12-27 MED ORDER — EPOETIN ALFA 20000 UNIT/ML IJ SOLN
INTRAMUSCULAR | Status: AC
  Administered 2012-12-27: 20000 [IU] via SUBCUTANEOUS
  Filled 2012-12-27: qty 1

## 2013-01-09 ENCOUNTER — Emergency Department (HOSPITAL_COMMUNITY): Payer: Medicare Other

## 2013-01-09 ENCOUNTER — Encounter (HOSPITAL_COMMUNITY): Payer: Self-pay

## 2013-01-09 ENCOUNTER — Inpatient Hospital Stay (HOSPITAL_COMMUNITY)
Admission: EM | Admit: 2013-01-09 | Discharge: 2013-01-12 | DRG: 149 | Disposition: A | Discharge status: Home / Self Care | Payer: Medicare Other | Attending: Family Medicine | Admitting: Family Medicine

## 2013-01-09 DIAGNOSIS — Z85828 Personal history of other malignant neoplasm of skin: Secondary | ICD-10-CM

## 2013-01-09 DIAGNOSIS — M129 Arthropathy, unspecified: Secondary | ICD-10-CM | POA: Diagnosis present

## 2013-01-09 DIAGNOSIS — N390 Urinary tract infection, site not specified: Secondary | ICD-10-CM

## 2013-01-09 DIAGNOSIS — I69998 Other sequelae following unspecified cerebrovascular disease: Secondary | ICD-10-CM

## 2013-01-09 DIAGNOSIS — H5440 Blindness, one eye, unspecified eye: Secondary | ICD-10-CM | POA: Diagnosis present

## 2013-01-09 DIAGNOSIS — Z794 Long term (current) use of insulin: Secondary | ICD-10-CM

## 2013-01-09 DIAGNOSIS — F319 Bipolar disorder, unspecified: Secondary | ICD-10-CM | POA: Diagnosis present

## 2013-01-09 DIAGNOSIS — E785 Hyperlipidemia, unspecified: Secondary | ICD-10-CM | POA: Diagnosis present

## 2013-01-09 DIAGNOSIS — E1121 Type 2 diabetes mellitus with diabetic nephropathy: Secondary | ICD-10-CM | POA: Diagnosis present

## 2013-01-09 DIAGNOSIS — F411 Generalized anxiety disorder: Secondary | ICD-10-CM | POA: Diagnosis present

## 2013-01-09 DIAGNOSIS — D649 Anemia, unspecified: Secondary | ICD-10-CM | POA: Diagnosis present

## 2013-01-09 DIAGNOSIS — K219 Gastro-esophageal reflux disease without esophagitis: Secondary | ICD-10-CM | POA: Diagnosis present

## 2013-01-09 DIAGNOSIS — E1129 Type 2 diabetes mellitus with other diabetic kidney complication: Secondary | ICD-10-CM | POA: Diagnosis present

## 2013-01-09 DIAGNOSIS — I10 Essential (primary) hypertension: Secondary | ICD-10-CM | POA: Diagnosis present

## 2013-01-09 DIAGNOSIS — E7800 Pure hypercholesterolemia, unspecified: Secondary | ICD-10-CM | POA: Diagnosis present

## 2013-01-09 DIAGNOSIS — N184 Chronic kidney disease, stage 4 (severe): Secondary | ICD-10-CM | POA: Diagnosis present

## 2013-01-09 DIAGNOSIS — J449 Chronic obstructive pulmonary disease, unspecified: Secondary | ICD-10-CM | POA: Diagnosis present

## 2013-01-09 DIAGNOSIS — H814 Vertigo of central origin: Principal | ICD-10-CM | POA: Diagnosis present

## 2013-01-09 DIAGNOSIS — E119 Type 2 diabetes mellitus without complications: Secondary | ICD-10-CM | POA: Diagnosis present

## 2013-01-09 DIAGNOSIS — J4489 Other specified chronic obstructive pulmonary disease: Secondary | ICD-10-CM | POA: Diagnosis present

## 2013-01-09 DIAGNOSIS — R51 Headache: Secondary | ICD-10-CM | POA: Diagnosis present

## 2013-01-09 DIAGNOSIS — R42 Dizziness and giddiness: Secondary | ICD-10-CM | POA: Diagnosis present

## 2013-01-09 DIAGNOSIS — R5381 Other malaise: Secondary | ICD-10-CM | POA: Diagnosis present

## 2013-01-09 LAB — COMPREHENSIVE METABOLIC PANEL
AST: 9 U/L (ref 0–37)
Albumin: 3.1 g/dL — ABNORMAL LOW (ref 3.5–5.2)
Alkaline Phosphatase: 148 U/L — ABNORMAL HIGH (ref 39–117)
BUN: 22 mg/dL (ref 6–23)
Chloride: 105 mEq/L (ref 96–112)
Potassium: 3.3 mEq/L — ABNORMAL LOW (ref 3.5–5.1)
Sodium: 140 mEq/L (ref 135–145)
Total Bilirubin: 0.4 mg/dL (ref 0.3–1.2)
Total Protein: 6.9 g/dL (ref 6.0–8.3)

## 2013-01-09 LAB — URINALYSIS, ROUTINE W REFLEX MICROSCOPIC
Bilirubin Urine: NEGATIVE
Glucose, UA: 250 mg/dL — AB
Nitrite: NEGATIVE
Specific Gravity, Urine: 1.016 (ref 1.005–1.030)
pH: 5.5 (ref 5.0–8.0)

## 2013-01-09 LAB — POCT I-STAT, CHEM 8
BUN: 24 mg/dL — ABNORMAL HIGH (ref 6–23)
Chloride: 109 mEq/L (ref 96–112)
Glucose, Bld: 185 mg/dL — ABNORMAL HIGH (ref 70–99)
HCT: 35 % — ABNORMAL LOW (ref 36.0–46.0)
Potassium: 3.4 mEq/L — ABNORMAL LOW (ref 3.5–5.1)

## 2013-01-09 LAB — DIFFERENTIAL
Basophils Absolute: 0 10*3/uL (ref 0.0–0.1)
Basophils Relative: 0 % (ref 0–1)
Eosinophils Absolute: 0.3 10*3/uL (ref 0.0–0.7)
Monocytes Relative: 5 % (ref 3–12)
Neutro Abs: 8.1 10*3/uL — ABNORMAL HIGH (ref 1.7–7.7)
Neutrophils Relative %: 70 % (ref 43–77)

## 2013-01-09 LAB — URINE MICROSCOPIC-ADD ON

## 2013-01-09 LAB — PROTIME-INR
INR: 0.97 (ref 0.00–1.49)
Prothrombin Time: 12.7 seconds (ref 11.6–15.2)

## 2013-01-09 LAB — CBC
Hemoglobin: 11.9 g/dL — ABNORMAL LOW (ref 12.0–15.0)
MCH: 29.8 pg (ref 26.0–34.0)
MCHC: 33.2 g/dL (ref 30.0–36.0)
Platelets: 275 10*3/uL (ref 150–400)
RDW: 14.5 % (ref 11.5–15.5)

## 2013-01-09 LAB — GLUCOSE, CAPILLARY

## 2013-01-09 LAB — TROPONIN I: Troponin I: <0.3 ng/mL (ref <0.30)

## 2013-01-09 LAB — APTT: aPTT: 30 seconds (ref 24–37)

## 2013-01-09 MED ORDER — DEXTROSE 5 % IV SOLN
1.0000 g | Freq: Once | INTRAVENOUS | Status: AC
  Administered 2013-01-09: 1 g via INTRAVENOUS
  Filled 2013-01-09: qty 10

## 2013-01-09 NOTE — ED Notes (Addendum)
Pt reports she had a sudden onset of dizziness and a headache approx 1 hour ago [place]. Pt reports she fell backwards hitting her head, denies LOC. No facial droop, no arm drift, grips and strengths equal bilaterally, pt has a hx of a stroke last year affecting the Right side of body. Pt vomiting x1 in triage

## 2013-01-09 NOTE — ED Provider Notes (Signed)
History    CSN: [id number] Arrival date & time 01/09/13  1910  First MD Initiated Contact with Patient 01/09/13 1936     Chief Complaint  Patient presents with  . Dizziness   (Consider location/radiation/quality/duration/timing/severity/associated sxs/prior Treatment) The history is provided by the patient and a relative.   patient states that she was walking in a creek with some family members and sat down on a stone. He states then she began to feel dizzy like she was spinning. She states she then stood up and fell over. She landed on her right shoulder. She did not hit her head. No chest pain. No trouble breathing. She had some nausea with the dizziness. No abdominal pain. No confusion. She is feeling better now. She's had a previous stroke that affected her right side. Past Medical History  Diagnosis Date  . Diabetes mellitus   . Hypertension   . COPD (chronic obstructive pulmonary disease)   . GERD (gastroesophageal reflux disease)   . Hypercholesteremia   . Anxiety   . Stroke   . Headaches due to old head injury   . Blind one eye   . Angina   . Shortness of breath   . Pneumonia   . Recurrent upper respiratory infection (URI)   . Anemia   . Arthritis   . Complication of anesthesia     difficulty waking up  . Chronic kidney disease   . Cancer     skin cancer right thumb  . Temporal arteritis 11/17/2011  . Gastric polyp     antral  . Stenosis of esophagus    Past Surgical History  Procedure Laterality Date  . Abdominal hysterectomy    . Cholecystectomy    . Heel spur surgery      bilateral  . Brain surgery    . Esophagogastroduodenoscopy  11/18/2011    Procedure: ESOPHAGOGASTRODUODENOSCOPY (EGD);  Surgeon: Iva Boop, MD;  Location: [facility] ENDOSCOPY;  Service: Endoscopy;  Laterality: N/A;   Family History  Problem Relation Age of Onset  . Colon cancer     History  Substance Use Topics  . Smoking status: Never Smoker   . Smokeless tobacco: Never Used  .  Alcohol Use: No   OB History   Grav Para Term Preterm Abortions TAB SAB Ect Mult Living                 Review of Systems  Constitutional: Negative for activity change and appetite change.  HENT: Negative for neck stiffness.   Eyes: Negative for pain.  Respiratory: Negative for chest tightness and shortness of breath.   Cardiovascular: Negative for chest pain and leg swelling.  Gastrointestinal: Positive for nausea. Negative for vomiting, abdominal pain and diarrhea.  Genitourinary: Negative for flank pain.  Musculoskeletal: Negative for back pain.  Skin: Negative for rash.  Neurological: Positive for dizziness. Negative for weakness, numbness and headaches.  Psychiatric/Behavioral: Negative for behavioral problems.    Allergies  Codeine  Home Medications   No current outpatient prescriptions on file. BP 138/80  Pulse 73  Temp(Src) 98.8 F (37.1 C) (Oral)  Resp 18  Ht 5\' 2"  (1.575 m)  Wt 167 lb 12.3 oz (76.1 kg)  BMI 30.68 kg/m2  SpO2 97% Physical Exam  Nursing note and vitals reviewed. Constitutional: She is oriented to person, place, and time. She appears well-developed and well-nourished.  HENT:  Head: Normocephalic and atraumatic.  Eyes: EOM are normal. Pupils are equal, round, and reactive to light.  Neck:  Normal range of motion. Neck supple.  Cardiovascular: Normal rate, regular rhythm and normal heart sounds.   No murmur heard. Pulmonary/Chest: Effort normal and breath sounds normal. No respiratory distress. She has no wheezes. She has no rales.  Abdominal: Soft. Bowel sounds are normal. She exhibits no distension. There is no tenderness. There is no rebound and no guarding.  Musculoskeletal: Normal range of motion.  Neurological: She is alert and oriented to person, place, and time. No cranial nerve deficit.  No nystagmus. Good grip strength bilaterally. Finger-nose intact bilaterally  Skin: Skin is warm and dry.  Psychiatric: She has a normal mood and  affect. Her speech is normal.    ED Course  Procedures (including critical care time) Labs Reviewed  CBC - Abnormal; Notable for the following:    WBC 11.6 (*)    Hemoglobin 11.9 (*)    HCT 35.8 (*)    All other components within normal limits  DIFFERENTIAL - Abnormal; Notable for the following:    Neutro Abs 8.1 (*)    All other components within normal limits  COMPREHENSIVE METABOLIC PANEL - Abnormal; Notable for the following:    Potassium 3.3 (*)    Glucose, Bld 185 (*)    Creatinine, Ser 2.38 (*)    Albumin 3.1 (*)    Alkaline Phosphatase 148 (*)    GFR calc non Af Amer 20 (*)    GFR calc Af Amer 23 (*)    All other components within normal limits  URINALYSIS, ROUTINE W REFLEX MICROSCOPIC - Abnormal; Notable for the following:    APPearance CLOUDY (*)    Glucose, UA 250 (*)    Protein, ur 30 (*)    Leukocytes, UA MODERATE (*)    All other components within normal limits  GLUCOSE, CAPILLARY - Abnormal; Notable for the following:    Glucose-Capillary 177 (*)    All other components within normal limits  URINE MICROSCOPIC-ADD ON - Abnormal; Notable for the following:    Squamous Epithelial / LPF MANY (*)    Bacteria, UA MANY (*)    Casts HYALINE CASTS (*)    All other components within normal limits  HEMOGLOBIN A1C - Abnormal; Notable for the following:    Hemoglobin A1C 6.8 (*)    Mean Plasma Glucose 148 (*)    All other components within normal limits  LIPID PANEL - Abnormal; Notable for the following:    Triglycerides 293 (*)    HDL 29 (*)    VLDL 59 (*)    All other components within normal limits  CBC - Abnormal; Notable for the following:    RBC 3.65 (*)    Hemoglobin 10.7 (*)    HCT 32.7 (*)    All other components within normal limits  CREATININE, SERUM - Abnormal; Notable for the following:    Creatinine, Ser 2.38 (*)    GFR calc non Af Amer 20 (*)    GFR calc Af Amer 23 (*)    All other components within normal limits  GLUCOSE, CAPILLARY - Abnormal;  Notable for the following:    Glucose-Capillary 140 (*)    All other components within normal limits  GLUCOSE, CAPILLARY - Abnormal; Notable for the following:    Glucose-Capillary 138 (*)    All other components within normal limits  BASIC METABOLIC PANEL - Abnormal; Notable for the following:    Potassium 3.0 (*)    Glucose, Bld 131 (*)    BUN 24 (*)  Creatinine, Ser 2.37 (*)    GFR calc non Af Amer 20 (*)    GFR calc Af Amer 23 (*)    All other components within normal limits  GLUCOSE, CAPILLARY - Abnormal; Notable for the following:    Glucose-Capillary 124 (*)    All other components within normal limits  GLUCOSE, CAPILLARY - Abnormal; Notable for the following:    Glucose-Capillary 145 (*)    All other components within normal limits  GLUCOSE, CAPILLARY - Abnormal; Notable for the following:    Glucose-Capillary 142 (*)    All other components within normal limits  GLUCOSE, CAPILLARY - Abnormal; Notable for the following:    Glucose-Capillary 122 (*)    All other components within normal limits  POCT I-STAT, CHEM 8 - Abnormal; Notable for the following:    Potassium 3.4 (*)    BUN 24 (*)    Creatinine, Ser 2.30 (*)    Glucose, Bld 185 (*)    Hemoglobin 11.9 (*)    HCT 35.0 (*)    All other components within normal limits  URINE CULTURE  PROTIME-INR  APTT  TROPONIN I  URINE RAPID DRUG SCREEN (HOSP PERFORMED)   Dg Chest 2 View  01/09/2013   *RADIOLOGY REPORT*  Clinical Data: Dizziness.  CHEST - 2 VIEW  Comparison: 06/14/2012  Findings: The cardiomediastinal silhouette is unremarkable. The lungs are clear. There is no evidence of focal airspace disease, pulmonary edema, suspicious pulmonary nodule/mass, pleural effusion, or pneumothorax. No acute bony abnormalities are identified.  IMPRESSION: No evidence of acute cardiopulmonary disease.   Original Report Authenticated By: Harmon Pier, M.D.   Ct Head (brain) Wo Contrast  01/09/2013   *RADIOLOGY REPORT*  Clinical Data:  67 year old female with headache, dizziness and head injury.  CT HEAD WITHOUT CONTRAST  Technique:  Contiguous axial images were obtained from the base of the skull through the vertex without contrast.  Comparison: 06/16/2011 head CT  Findings: Chronic small vessel white matter ischemic changes are again noted. Remote infarcts in the left occipital region and right basal ganglia noted.  No acute intracranial abnormalities are identified, including mass lesion or mass effect, hydrocephalus, extra-axial fluid collection, midline shift, hemorrhage, or acute infarction.  The visualized bony calvarium is unremarkable.  IMPRESSION: No evidence of acute intracranial abnormality.  Chronic small vessel white matter ischemic changes and remote infarcts as described.   Original Report Authenticated By: Harmon Pier, M.D.   Mri Brain Without Contrast  01/10/2013   *RADIOLOGY REPORT*  Clinical Data:  Vertigo.  Diabetes and hypertension.  MRI HEAD WITHOUT CONTRAST MRA HEAD WITHOUT CONTRAST  Technique:  Multiplanar, multiecho pulse sequences of the brain and surrounding structures were obtained without intravenous contrast. Angiographic images of the head were obtained using MRA technique without contrast.  Comparison:  CT 01/09/2013  MRI HEAD  Findings:  Diffusion imaging does not show any acute or subacute infarction.  The brainstem and cerebellum are normal.  There is an old infarction in the right basal ganglia and radiating white matter tracts.  There are old infarctions in the left external capsule and radiating white matter tracts. There is an old infarction in the left occipital lobe.  No mass lesion, hemorrhage, hydrocephalus or extra-axial collection.  No pituitary mass.  No inflammatory sinus disease.  IMPRESSION: No acute infarction.  Old left occipital infarction.  Old infarctions in the basal ganglia and radiating white matter tracts.  MRA HEAD  Findings: Both internal carotid arteries are widely patent into the  brain.  The anterior and middle cerebral vessels are patent without proximal stenosis, aneurysm or vascular malformation.  There is moderate atherosclerotic irregularity in the right M1 segment.  Both vertebral arteries are widely patent to the basilar.  No basilar stenosis.  Posterior circulation branch vessels are patent. There is mild atherosclerotic irregularity in the PCA branches.  IMPRESSION: No major vessel occlusion or correctable proximal stenosis.  Medium vessel atherosclerotic irregularity most notable on the right M1 segment and in the PCA branches.   Original Report Authenticated By: Paulina Fusi, M.D.   Mr Mra Head/brain Wo Cm  01/10/2013   *RADIOLOGY REPORT*  Clinical Data:  Vertigo.  Diabetes and hypertension.  MRI HEAD WITHOUT CONTRAST MRA HEAD WITHOUT CONTRAST  Technique:  Multiplanar, multiecho pulse sequences of the brain and surrounding structures were obtained without intravenous contrast. Angiographic images of the head were obtained using MRA technique without contrast.  Comparison:  CT 01/09/2013  MRI HEAD  Findings:  Diffusion imaging does not show any acute or subacute infarction.  The brainstem and cerebellum are normal.  There is an old infarction in the right basal ganglia and radiating white matter tracts.  There are old infarctions in the left external capsule and radiating white matter tracts. There is an old infarction in the left occipital lobe.  No mass lesion, hemorrhage, hydrocephalus or extra-axial collection.  No pituitary mass.  No inflammatory sinus disease.  IMPRESSION: No acute infarction.  Old left occipital infarction.  Old infarctions in the basal ganglia and radiating white matter tracts.  MRA HEAD  Findings: Both internal carotid arteries are widely patent into the brain.  The anterior and middle cerebral vessels are patent without proximal stenosis, aneurysm or vascular malformation.  There is moderate atherosclerotic irregularity in the right M1 segment.  Both  vertebral arteries are widely patent to the basilar.  No basilar stenosis.  Posterior circulation branch vessels are patent. There is mild atherosclerotic irregularity in the PCA branches.  IMPRESSION: No major vessel occlusion or correctable proximal stenosis.  Medium vessel atherosclerotic irregularity most notable on the right M1 segment and in the PCA branches.   Original Report Authenticated By: Paulina Fusi, M.D.   1. Vertigo   2. UTI (urinary tract infection)   3. HTN (hypertension)   4. Type II or unspecified type diabetes mellitus without mention of complication, not stated as uncontrolled   5. UTI (lower urinary tract infection)   6. Chest pain     MDM  Patient with vertigo. Improved. Needs central vertigo ruled out. His UTI. Will be admitted to hospitalist  [facility] R. Rubin Payor, MD 01/11/13 1406

## 2013-01-10 ENCOUNTER — Encounter (HOSPITAL_COMMUNITY): Payer: Self-pay | Admitting: *Deleted

## 2013-01-10 ENCOUNTER — Inpatient Hospital Stay (HOSPITAL_COMMUNITY): Payer: Medicare Other

## 2013-01-10 DIAGNOSIS — E119 Type 2 diabetes mellitus without complications: Secondary | ICD-10-CM

## 2013-01-10 DIAGNOSIS — G459 Transient cerebral ischemic attack, unspecified: Secondary | ICD-10-CM

## 2013-01-10 DIAGNOSIS — R42 Dizziness and giddiness: Secondary | ICD-10-CM

## 2013-01-10 DIAGNOSIS — I10 Essential (primary) hypertension: Secondary | ICD-10-CM

## 2013-01-10 DIAGNOSIS — N390 Urinary tract infection, site not specified: Secondary | ICD-10-CM

## 2013-01-10 LAB — CBC
Hemoglobin: 10.7 g/dL — ABNORMAL LOW (ref 12.0–15.0)
MCH: 29.3 pg (ref 26.0–34.0)
MCHC: 32.7 g/dL (ref 30.0–36.0)
RDW: 14.6 % (ref 11.5–15.5)

## 2013-01-10 LAB — RAPID URINE DRUG SCREEN, HOSP PERFORMED
Amphetamines: NOT DETECTED
Barbiturates: NOT DETECTED
Benzodiazepines: NOT DETECTED
Cocaine: NOT DETECTED
Opiates: NOT DETECTED
Tetrahydrocannabinol: NOT DETECTED

## 2013-01-10 LAB — GLUCOSE, CAPILLARY
Glucose-Capillary: 140 mg/dL — ABNORMAL HIGH (ref 70–99)
Glucose-Capillary: 138 mg/dL — ABNORMAL HIGH (ref 70–99)
Glucose-Capillary: 124 mg/dL — ABNORMAL HIGH (ref 70–99)

## 2013-01-10 LAB — HEMOGLOBIN A1C
Hgb A1c MFr Bld: 6.8 % — ABNORMAL HIGH (ref <5.7)
Mean Plasma Glucose: 148 mg/dL — ABNORMAL HIGH (ref <117)

## 2013-01-10 LAB — CREATININE, SERUM: Creatinine, Ser: 2.38 mg/dL — ABNORMAL HIGH (ref 0.50–1.10)

## 2013-01-10 LAB — LIPID PANEL: Cholesterol: 159 mg/dL (ref 0–200)

## 2013-01-10 MED ORDER — CALCITRIOL 0.25 MCG PO CAPS
0.2500 ug | ORAL_CAPSULE | Freq: Every morning | ORAL | Status: DC
  Administered 2013-01-10 – 2013-01-11 (×2): 0.25 ug via ORAL
  Filled 2013-01-10 (×3): qty 1

## 2013-01-10 MED ORDER — LUBIPROSTONE 24 MCG PO CAPS
24.0000 ug | ORAL_CAPSULE | Freq: Every day | ORAL | Status: DC
  Administered 2013-01-10 – 2013-01-12 (×3): 24 ug via ORAL
  Filled 2013-01-10 (×4): qty 1

## 2013-01-10 MED ORDER — LORATADINE 10 MG PO TABS
10.0000 mg | ORAL_TABLET | Freq: Every morning | ORAL | Status: DC
  Administered 2013-01-10 – 2013-01-11 (×2): 10 mg via ORAL
  Filled 2013-01-10 (×4): qty 1

## 2013-01-10 MED ORDER — DEXTROSE 5 % IV SOLN
1.0000 g | INTRAVENOUS | Status: DC
  Administered 2013-01-10 – 2013-01-11 (×2): 1 g via INTRAVENOUS
  Filled 2013-01-10 (×3): qty 10

## 2013-01-10 MED ORDER — ATORVASTATIN CALCIUM 40 MG PO TABS
40.0000 mg | ORAL_TABLET | Freq: Every morning | ORAL | Status: DC
  Administered 2013-01-10 – 2013-01-11 (×2): 40 mg via ORAL
  Filled 2013-01-10 (×4): qty 1

## 2013-01-10 MED ORDER — PANTOPRAZOLE SODIUM 40 MG PO TBEC
40.0000 mg | DELAYED_RELEASE_TABLET | Freq: Every morning | ORAL | Status: DC
  Administered 2013-01-10 – 2013-01-11 (×2): 40 mg via ORAL
  Filled 2013-01-10: qty 1

## 2013-01-10 MED ORDER — INSULIN ASPART 100 UNIT/ML ~~LOC~~ SOLN: 0.0000 [IU] | Freq: Every day | SUBCUTANEOUS | Status: DC

## 2013-01-10 MED ORDER — INSULIN ASPART 100 UNIT/ML ~~LOC~~ SOLN
0.0000 [IU] | Freq: Three times a day (TID) | SUBCUTANEOUS | Status: DC
  Administered 2013-01-10 – 2013-01-11 (×4): 2 [IU] via SUBCUTANEOUS
  Administered 2013-01-12: 3 [IU] via SUBCUTANEOUS

## 2013-01-10 MED ORDER — ASPIRIN EC 81 MG PO TBEC
81.0000 mg | DELAYED_RELEASE_TABLET | Freq: Every morning | ORAL | Status: DC
  Administered 2013-01-10: 81 mg via ORAL
  Filled 2013-01-10: qty 1

## 2013-01-10 MED ORDER — TIMOLOL MALEATE 0.5 % OP SOLN
1.0000 [drp] | Freq: Two times a day (BID) | OPHTHALMIC | Status: DC
  Administered 2013-01-10 – 2013-01-11 (×4): 1 [drp] via OPHTHALMIC
  Filled 2013-01-10: qty 5

## 2013-01-10 MED ORDER — INSULIN ASPART 100 UNIT/ML ~~LOC~~ SOLN
13.0000 [IU] | Freq: Every day | SUBCUTANEOUS | Status: DC
  Administered 2013-01-10 – 2013-01-11 (×2): 13 [IU] via SUBCUTANEOUS

## 2013-01-10 MED ORDER — DULOXETINE HCL 30 MG PO CPEP
30.0000 mg | ORAL_CAPSULE | Freq: Every morning | ORAL | Status: DC
  Administered 2013-01-10 – 2013-01-11 (×2): 30 mg via ORAL
  Filled 2013-01-10 (×4): qty 1

## 2013-01-10 MED ORDER — POTASSIUM CHLORIDE ER 10 MEQ PO TBCR
10.0000 meq | EXTENDED_RELEASE_TABLET | Freq: Every morning | ORAL | Status: DC
  Administered 2013-01-10 – 2013-01-11 (×2): 10 meq via ORAL
  Filled 2013-01-10 (×2): qty 1

## 2013-01-10 MED ORDER — INSULIN DETEMIR 100 UNIT/ML ~~LOC~~ SOLN
30.0000 [IU] | Freq: Every day | SUBCUTANEOUS | Status: DC
  Administered 2013-01-10 – 2013-01-11 (×2): 30 [IU] via SUBCUTANEOUS
  Filled 2013-01-10 (×3): qty 0.3

## 2013-01-10 MED ORDER — FUROSEMIDE 40 MG PO TABS
40.0000 mg | ORAL_TABLET | Freq: Every morning | ORAL | Status: DC
  Administered 2013-01-10: 40 mg via ORAL
  Filled 2013-01-10: qty 1

## 2013-01-10 MED ORDER — HEPARIN SODIUM (PORCINE) 5000 UNIT/ML IJ SOLN
5000.0000 [IU] | Freq: Three times a day (TID) | INTRAMUSCULAR | Status: DC
  Administered 2013-01-10 – 2013-01-12 (×6): 5000 [IU] via SUBCUTANEOUS
  Filled 2013-01-10 (×9): qty 1

## 2013-01-10 MED ORDER — ALBUTEROL SULFATE (5 MG/ML) 0.5% IN NEBU: 2.5000 mg | INHALATION_SOLUTION | RESPIRATORY_TRACT | Status: DC | PRN

## 2013-01-10 MED ORDER — TIMOLOL HEMIHYDRATE 0.5 % OP SOLN: 1.0000 [drp] | Freq: Two times a day (BID) | OPHTHALMIC | Status: DC

## 2013-01-10 MED ORDER — ASPIRIN 325 MG PO TABS
325.0000 mg | ORAL_TABLET | Freq: Every day | ORAL | Status: DC
  Administered 2013-01-10 – 2013-01-11 (×2): 325 mg via ORAL
  Filled 2013-01-10 (×4): qty 1

## 2013-01-10 MED ORDER — CITALOPRAM HYDROBROMIDE 20 MG PO TABS
20.0000 mg | ORAL_TABLET | Freq: Every morning | ORAL | Status: DC
Start: 2013-01-10 — End: 2013-01-12
  Administered 2013-01-10 – 2013-01-11 (×2): 20 mg via ORAL
  Filled 2013-01-10 (×4): qty 1

## 2013-01-10 MED ORDER — FUROSEMIDE 20 MG PO TABS
20.0000 mg | ORAL_TABLET | Freq: Every morning | ORAL | Status: DC
  Administered 2013-01-11: 20 mg via ORAL
  Filled 2013-01-10: qty 1

## 2013-01-10 NOTE — Progress Notes (Signed)
1:27 PM I agree with HPI/GPe and A/P per Dr. Houston Siren  Patient was walking yesterday near her home with her daughter in law and they took the dog so for walk. She states she claims he'll and then felt a sudden onset dizziness with headache and had to go and sit down. She then got back up and walked again for a little while and felt the same issue and because of this he mostly the emergency room. He did not have any focal deficits but has a prior CVA with visual loss and hence was admitted to rule out the same      HEENT CHEST CARDIAC ABDOMEN NEURO SKIN/MUSCULAR  Patient Active Problem List   Diagnosis Date Noted  . Vertigo 01/10/2013  . UTI (lower urinary tract infection) 01/10/2013  . 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 Gastric polyps 11/18/2011  . Chest pain 11/17/2011  . Iron deficiency anemia, unspecified 11/17/2011  . 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 Temporal arteritis 11/17/2011  . HTN (hypertension) 11/17/2011  . Type II or unspecified type diabetes mellitus without mention of complication, not stated as uncontrolled 11/17/2011  . Esophageal dysphagia 11/17/2011  . Epigastric pain 11/17/2011   1. Probable Benign Vertigo-Await Echo/Carotids-MRI/A shows no new deficit.  WIll finish CVA work-up.  At present she is able to sit up in bed.  We will have her evaluated for peripheral vertigo by OT/PT 2. ?UTI-Follow cultures, continue ceftriaxone 1 g 3. ? CVA-see above. Currently on aspirin 81 mg which we will change to 325 mg 4. Hyperlipidemia continue Lipitor 40 mg, h intensity statin dose 5. Diabetic nephropathy-will try to limit nephrotoxins. Cut back dose of Lasix 40 mg to 20 mg daily as might be contributing to dizziness 6. Diabetes mellitus-A1c 6.8 patient states blood sugars run high. Continue Levemir 30 units subcutaneously, novolog 13 units daily and sliding scale 7. Bipolar-continue Celexa 20 every morning, Cymbalta 30 q. A.m. 8. Hypertension-history of.  Non-any medications per se.  I called her son but did not obtain a response  Pleas Koch, MD [facility] [phone]

## 2013-01-10 NOTE — Progress Notes (Addendum)
Entered on wrong patient

## 2013-01-10 NOTE — H&P (Signed)
[facility] History and Physical  Savannah Lindsey ZOX:[id number] DOB: 06-Mar-1946    PCP:   Arlyss Queen   Chief Complaint: vertigo.  HPI: Savannah Lindsey is an 67 y.o. female with hx of DM, HTN, hyperlipidemia, prior CVA, COPD, presents to the ER with sudden onset of severe nonpositional vertigo.  She felt severe vertigo and fell.  She has mild HA at that time.  There has been no slurred speech or focal weakness.  She didn't have any fever, chills, or recent URI symptoms. She denied earaches.  After this episode, she no longer been vertiginous.  Evaluation in the ER included a negative head CT.  Her UA was borderline positive for UTI.  She has hx of CKD and her Cr is 2.4.  Her WBC is slightly elevated at 11K.  Hospitalist was asked to admit her for VBI, ? Central vertigo, and UTI.  Rewiew of Systems:  Constitutional: Negative for malaise, fever and chills. No significant weight loss or weight gain Eyes: Negative for eye pain, redness and discharge, diplopia, visual changes, or flashes of light. ENMT: Negative for ear pain, hoarseness, nasal congestion, sinus pressure and sore throat. No headaches; tinnitus, drooling, or problem swallowing. Cardiovascular: Negative for chest pain, palpitations, diaphoresis, dyspnea and peripheral edema. ; No orthopnea, PND Respiratory: Negative for cough, hemoptysis, wheezing and stridor. No pleuritic chestpain. Gastrointestinal: Negative for nausea, vomiting, diarrhea, constipation, abdominal pain, melena, blood in stool, hematemesis, jaundice and rectal bleeding.    Genitourinary: Negative for frequency, dysuria, incontinence,flank pain and hematuria; Musculoskeletal: Negative for back pain and neck pain. Negative for swelling and trauma.;  Skin: . Negative for pruritus, rash, abrasions, bruising and skin lesion.; ulcerations Neuro: Negative for headache, lightheadedness and neck stiffness. Negative for weakness, altered level of consciousness ,  altered mental status, extremity weakness, burning feet, involuntary movement, seizure and syncope.  Psych: negative for anxiety, depression, insomnia, tearfulness, panic attacks, hallucinations, paranoia, suicidal or homicidal ideation    Past Medical History  Diagnosis Date  . Diabetes mellitus   . Hypertension   . COPD (chronic obstructive pulmonary disease)   . GERD (gastroesophageal reflux disease)   . Hypercholesteremia   . Anxiety   . Stroke   . Headaches due to old head injury   . Blind one eye   . Angina   . Shortness of breath   . Pneumonia   . Recurrent upper respiratory infection (URI)   . Anemia   . Arthritis   . Complication of anesthesia     difficulty waking up  . Chronic kidney disease   . Cancer     skin cancer right thumb  . Temporal arteritis 11/17/2011  . Gastric polyp     antral  . Stenosis of esophagus     Past Surgical History  Procedure Laterality Date  . Abdominal hysterectomy    . Cholecystectomy    . Heel spur surgery      bilateral  . Brain surgery    . Esophagogastroduodenoscopy  11/18/2011    Procedure: ESOPHAGOGASTRODUODENOSCOPY (EGD);  Surgeon: Iva Boop, MD;  Location: [facility] ENDOSCOPY;  Service: Endoscopy;  Laterality: N/A;    Medications:  HOME MEDS: Prior to Admission medications   Medication Sig Start Date End Date Taking? Authorizing Provider  albuterol (PROVENTIL) (2.5 MG/3ML) 0.083% nebulizer solution Take 2.5 mg by nebulization every 6 (six) hours as needed for wheezing. For wheezing   Yes Historical Provider, MD  aspirin EC 81 MG tablet Take 81 mg by mouth every  morning.   Yes Historical Provider, MD  atorvastatin (LIPITOR) 40 MG tablet Take 40 mg by mouth every morning.    Yes Historical Provider, MD  calcitRIOL (ROCALTROL) 0.25 MCG capsule Take 0.25 mcg by mouth every morning.   Yes Historical Provider, MD  citalopram (CELEXA) 20 MG tablet Take 20 mg by mouth every morning.   Yes Historical Provider, MD  DULoxetine  (CYMBALTA) 30 MG capsule Take 30 mg by mouth every morning.   Yes Historical Provider, MD  furosemide (LASIX) 40 MG tablet Take 40 mg by mouth every morning.   Yes Historical Provider, MD  insulin aspart (NOVOLOG) 100 UNIT/ML injection Inject 13 Units into the skin daily. Takes around 1100-1200 daily   Yes Historical Provider, MD  insulin detemir (LEVEMIR) 100 UNIT/ML injection Inject 30 Units into the skin at bedtime.    Yes Historical Provider, MD  loratadine (CLARITIN) 10 MG tablet Take 10 mg by mouth every morning.    Yes Historical Provider, MD  lubiprostone (AMITIZA) 24 MCG capsule Take 24 mcg by mouth daily with breakfast.   Yes Historical Provider, MD  pantoprazole (PROTONIX) 40 MG tablet Take 40 mg by mouth every morning.   Yes Historical Provider, MD  potassium chloride (K-DUR) 10 MEQ tablet Take 10 mEq by mouth every morning.   Yes Historical Provider, MD  timolol (BETIMOL) 0.5 % ophthalmic solution Place 1 drop into both eyes 2 (two) times daily.   Yes Historical Provider, MD     Allergies:  Allergies  Allergen Reactions  . Codeine Itching and Nausea And Vomiting    Social History:   reports that she has never smoked. She has never used smokeless tobacco. She reports that she does not drink alcohol or use illicit drugs.  Family History: Family History  Problem Relation Age of Onset  . Colon cancer       Physical Exam: Filed Vitals:   01/10/13 0021 01/10/13 0100 01/10/13 0201 01/10/13 0216  BP: 150/64 105/48 103/61 146/86  Pulse: 66 67 63 61  Temp:    98.1 F (36.7 C)  TempSrc:    Oral  Resp: 19 32 18 20  Height:    5\' 2"  (1.575 m)  Weight:    76.1 kg (167 lb 12.3 oz)  SpO2: 95% 91% 96% 96%   Blood pressure 146/86, pulse 61, temperature 98.1 F (36.7 C), temperature source Oral, resp. rate 20, height 5\' 2"  (1.575 m), weight 76.1 kg (167 lb 12.3 oz), SpO2 96.00%.  GEN:  Pleasant patient lying in the stretcher in no acute distress; cooperative with exam. PSYCH:   alert and oriented x4; does not appear anxious or depressed; affect is appropriate. HEENT: Mucous membranes pink and anicteric; PERRLA; EOM intact; no cervical lymphadenopathy nor thyromegaly or carotid bruit; no JVD; There were no stridor. Neck is very supple. She has very poor vx in the right eye (not new) Breasts:: Not examined CHEST WALL: No tenderness CHEST: Normal respiration, clear to auscultation bilaterally.  HEART: Regular rate and rhythm.  There are no murmur, rub, or gallops.   BACK: No kyphosis or scoliosis; no CVA tenderness ABDOMEN: soft and non-tender; no masses, no organomegaly, normal abdominal bowel sounds; no pannus; no intertriginous candida. There is no rebound and no distention. Rectal Exam: Not done EXTREMITIES: No bone or joint deformity; age-appropriate arthropathy of the hands and knees; no edema; no ulcerations.  There is no calf tenderness. Genitalia: not examined PULSES: 2+ and symmetric SKIN: Normal hydration no rash or ulceration  CNS: Cranial nerves 2-12 grossly intact no focal lateralizing neurologic deficit.  Speech is fluent; uvula elevated with phonation, facial symmetry and tongue midline. DTR are normal bilaterally, cerebella exam is intact, barbinski is negative and strengths are equaled bilaterally.  No sensory loss.   Labs on Admission:  Basic Metabolic Panel:  Recent Labs Lab 01/09/13 1936 01/09/13 1951  NA 140 142  K 3.3* 3.4*  CL 105 109  CO2 23  --   GLUCOSE 185* 185*  BUN 22 24*  CREATININE 2.38* 2.30*  CALCIUM 9.0  --    Liver Function Tests:  Recent Labs Lab 01/09/13 1936  AST 9  ALT 6  ALKPHOS 148*  BILITOT 0.4  PROT 6.9  ALBUMIN 3.1*   No results found for this basename: LIPASE, AMYLASE,  in the last 168 hours No results found for this basename: AMMONIA,  in the last 168 hours CBC:  Recent Labs Lab 01/09/13 1936 01/09/13 1951  WBC 11.6*  --   NEUTROABS 8.1*  --   HGB 11.9* 11.9*  HCT 35.8* 35.0*  MCV 89.7  --    PLT 275  --    Cardiac Enzymes:  Recent Labs Lab 01/09/13 1938  TROPONINI <0.30    CBG:  Recent Labs Lab 01/09/13 1949  GLUCAP 177*     Radiological Exams on Admission: Dg Chest 2 View  01/09/2013   *RADIOLOGY REPORT*  Clinical Data: Dizziness.  CHEST - 2 VIEW  Comparison: 06/14/2012  Findings: The cardiomediastinal silhouette is unremarkable. The lungs are clear. There is no evidence of focal airspace disease, pulmonary edema, suspicious pulmonary nodule/mass, pleural effusion, or pneumothorax. No acute bony abnormalities are identified.  IMPRESSION: No evidence of acute cardiopulmonary disease.   Original Report Authenticated By: Harmon Pier, M.D.   Ct Head (brain) Wo Contrast  01/09/2013   *RADIOLOGY REPORT*  Clinical Data: 67 year old female with headache, dizziness and head injury.  CT HEAD WITHOUT CONTRAST  Technique:  Contiguous axial images were obtained from the base of the skull through the vertex without contrast.  Comparison: 06/16/2011 head CT  Findings: Chronic small vessel white matter ischemic changes are again noted. Remote infarcts in the left occipital region and right basal ganglia noted.  No acute intracranial abnormalities are identified, including mass lesion or mass effect, hydrocephalus, extra-axial fluid collection, midline shift, hemorrhage, or acute infarction.  The visualized bony calvarium is unremarkable.  IMPRESSION: No evidence of acute intracranial abnormality.  Chronic small vessel white matter ischemic changes and remote infarcts as described.   Original Report Authenticated By: Harmon Pier, M.D.   Assessment/Plan Present on Admission:  . Vertigo . Type II or unspecified type diabetes mellitus without mention of complication, not stated as uncontrolled . HTN (hypertension) . Renal disease due to diabetes mellitus UTI  PLAN:  This is suspicious for Vetebral Basilar TIA.  Will obtain MRI/MRA of the brain along with ECHO and carotid doppler.  She  also has a UTI and will be treated with IV Rocephin.  Her HTN is under good control, and I will continue with her meds.  For her Dm, will use SSI as needed.  She is stable, full code, and will be admitted to [facility] service.  Thank you for allowing me to participate in the care of this nice patient.  Other plans as per orders.  Code Status: FULL Unk Lightning, MD. [facility] Pager [phone] 7pm to 7am.  01/10/2013, 2:43 AM

## 2013-01-10 NOTE — Progress Notes (Signed)
Utilization review completed. Zakiyyah Savannah, RN, BSN. 

## 2013-01-10 NOTE — Progress Notes (Signed)
PT Cancellation Note  Patient Details Name: Savannah Lindsey MRN: [id number] DOB: 14-Jun-1946   Cancelled Treatment:    Reason Eval/Treat Not Completed: Patient at procedure or test/unavailable; just now on the way to vascular lab.  Will see tomorrow for vestibular assessment.   WYNN,CYNDI 01/10/2013, 3:44 PM

## 2013-01-10 NOTE — Progress Notes (Signed)
  Echocardiogram 2D Echocardiogram has been performed.  Georgian Co 01/10/2013, 4:25 PM

## 2013-01-10 NOTE — Progress Notes (Signed)
VASCULAR LAB PRELIMINARY  PRELIMINARY  PRELIMINARY  PRELIMINARY  Carotid duplex completed.    Preliminary report:  Bilateral:  Less than 39% ICA stenosis.  Vertebral artery flow is antegrade.     Savannah Lindsey, RVS 01/10/2013, 7:06 PM

## 2013-01-11 LAB — URINE CULTURE

## 2013-01-11 LAB — BASIC METABOLIC PANEL
Calcium: 8.9 mg/dL (ref 8.4–10.5)
Creatinine, Ser: 2.37 mg/dL — ABNORMAL HIGH (ref 0.50–1.10)
GFR calc Af Amer: 23 mL/min — ABNORMAL LOW (ref >90)

## 2013-01-11 LAB — GLUCOSE, CAPILLARY
Glucose-Capillary: 142 mg/dL — ABNORMAL HIGH (ref 70–99)
Glucose-Capillary: 122 mg/dL — ABNORMAL HIGH (ref 70–99)

## 2013-01-11 MED ORDER — POTASSIUM CHLORIDE ER 10 MEQ PO TBCR
40.0000 meq | EXTENDED_RELEASE_TABLET | Freq: Every morning | ORAL | Status: DC
  Filled 2013-01-11 (×2): qty 4

## 2013-01-11 MED ORDER — POTASSIUM CHLORIDE CRYS ER 20 MEQ PO TBCR
30.0000 meq | EXTENDED_RELEASE_TABLET | Freq: Once | ORAL | Status: AC
  Administered 2013-01-11: 30 meq via ORAL
  Filled 2013-01-11: qty 1

## 2013-01-11 MED ORDER — POTASSIUM CHLORIDE ER 10 MEQ PO TBCR: 40.0000 meq | EXTENDED_RELEASE_TABLET | Freq: Every morning | ORAL | Status: DC

## 2013-01-11 NOTE — Evaluation (Signed)
Physical Therapy Evaluation Patient Details Name: Savannah Lindsey MRN: [id number] DOB: Aug 30, 1945 Today's Date: 01/11/2013 Time: 1350-1420 PT Time Calculation (min): 30 min  PT Assessment / Plan / Recommendation History of Present Illness  Savannah Lindsey is an 67 y.o. female with hx of DM, HTN, hyperlipidemia, prior CVA, COPD, presents to the ER with sudden onset of severe nonpositional vertigo.  She felt severe vertigo and fell.  She has mild HA at that time.  There has been no slurred speech or focal weakness.  She didn't have any fever, chills, or recent URI symptoms. She denied earaches.  After this episode, she no longer been vertiginous.  Evaluation in the ER included a negative head CT.  Her UA was borderline positive for UTI.  She has hx of CKD and her Cr is 2.4.  Her WBC is slightly elevated at 11K.  Hospitalist was asked to admit her for VBI, ? Central vertigo, and UTI.  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  Pt continues to have visual deficits that seems to cause overall dizziness.  Performed the following test:  Modified dix hall pike left/right negative for nystagmus and no dizziness noted, Positive right sided hemianopsia, positive right sided smooth pursuit impairment, normal VOR cancellation & abnormal saccades on right side.  Pt with positive central vertigo impairments and unable to r/o peripheral impairments due to central vertigo.  Highly recommend OPPT to follow for visual deficits and balance deficits.  Pt did report that dizziness tends to happen with ambulation or transfers when pt is having to quickly move eyes from one place to another. No positional vertigo noted when getting OOB or with dix hall pike.  Educated pt on compensation with fixation on objects during mobility.      PT Assessment  Patient needs continued PT services    Follow Up Recommendations  Outpatient PT    Equipment Recommendations  None recommended by PT     Frequency Min 3X/week    Precautions / Restrictions Precautions Precautions: Fall Restrictions Weight Bearing Restrictions: No   Pertinent Vitals/Pain No c/o pain      Mobility  Bed Mobility Bed Mobility: Supine to Sit Supine to Sit: 6: Modified independent (Device/Increase time) Transfers Transfers: Sit to Stand;Stand to Sit Sit to Stand: 6: Modified independent (Device/Increase time) Stand to Sit: 6: Modified independent (Device/Increase time) Ambulation/Gait Ambulation/Gait Assistance: 5: Supervision Ambulation Distance (Feet): 100 Feet Assistive device: None Ambulation/Gait Assistance Details: supervision for safety; educated on compensation with visual fixation with proper movement; Pt with increase dizziness when looking at floor then look upright.  Gait Pattern: Step-to pattern;Decreased stride length;Shuffle Gait velocity: decreased Stairs: No    Exercises     PT Diagnosis: Difficulty walking;Generalized weakness  PT Problem List: Decreased strength;Decreased activity tolerance;Decreased balance;Decreased mobility PT Treatment Interventions: DME instruction;Stair training;Functional mobility training;Therapeutic activities;Therapeutic exercise;Balance training;Neuromuscular re-education;Patient/family education     PT Goals(Current goals can be found in the care plan section) Acute Rehab PT Goals Patient Stated Goal: To go home PT Goal Formulation: With patient Time For Goal Achievement: 01/18/13 Potential to Achieve Goals: Good  Visit Information  Last PT Received On: 01/11/13 Assistance Needed: +1 History of Present Illness: Savannah Lindsey is an 67 y.o. female with hx of DM, HTN, hyperlipidemia, prior CVA, COPD, presents to the ER with sudden onset of severe nonpositional vertigo.  She felt severe vertigo and fell.  She has mild HA at that time.  There has been no slurred  speech or focal weakness.  She didn't have any fever, chills, or recent URI symptoms.  She denied earaches.  After this episode, she no longer been vertiginous.  Evaluation in the ER included a negative head CT.  Her UA was borderline positive for UTI.  She has hx of CKD and her Cr is 2.4.  Her WBC is slightly elevated at 11K.  Hospitalist was asked to admit her for VBI, ? Central vertigo, and UTI.       Prior Functioning  Home Living Family/patient expects to be discharged to:: Private residence Living Arrangements: Children Available Help at Discharge: Family Type of Home: Mobile home Home Access: Stairs to enter Entrance Stairs-Number of Steps: 6 Entrance Stairs-Rails: Right;Left;Can reach both Home Layout: One level Home Equipment: Walker - 2 wheels;Bedside commode;Cane - single point Prior Function Level of Independence: Independent Comments: reports 6 falls in the last 6 months Communication Communication: No difficulties Dominant Hand: Right    Cognition  Cognition Arousal/Alertness: Awake/alert    Extremity/Trunk Assessment Lower Extremity Assessment Lower Extremity Assessment: Overall WFL for tasks assessed   Balance    End of Session PT - End of Session Equipment Utilized During Treatment: Gait belt Activity Tolerance: Patient tolerated treatment well Patient left: in bed;with call bell/phone within reach (sitting EOB) Nurse Communication: Mobility status  GP     Savannah Lindsey 01/11/2013, 3:26 PM  Savannah Lindsey, PT DPT [phone]

## 2013-01-11 NOTE — Progress Notes (Signed)
PROGRESS NOTE  Savannah Lindsey WUJ:[id number] DOB: February 19, 1946 DOA: 01/09/2013 PCP: Arlyss Queen  Brief narrative: 67 yr old, known CVA 5/14 [outside hosp?], No-cardiac CP, CKD 3-4, GERd + Helicobacterinfxn's amitted 01/10/13 with dizzyness and vertigo.   Past medical history-As per Problem list Chart reviewed as below- Admission for Atypical CP [likely GERD], indeterminant diagnosis of PE   Consultants:  None currently  Procedures:  Carotids 7/12-39% stenosis ICD  ECHO 60-65% 7/11 normal wm, no anomally  MRI/A brain 7/11-NO acute infarct, old occipital infarct-MRA shows no occlusion/prox stenosis  Antibiotics:  Rocephin 01/09/13   Subjective  Well.  Family in room.  Feels to be at her regular baseline. Seen again later c PT and noted to have vertigo and some nystagmus to R side and some  hemianopsia   Objective    Interim History: none  Telemetry: nsr  Objective: Filed Vitals:   01/10/13 1802 01/10/13 2045 01/11/13 0517 01/11/13 0900  BP: 136/80 121/76 130/77 138/80  Pulse: 67 61 73 73  Temp: 98.2 F (36.8 C) 98.2 F (36.8 C) 98.3 F (36.8 C) 98.8 F (37.1 C)  TempSrc: Oral Oral Oral Oral  Resp: 18 18 18 18   Height:      Weight:      SpO2: 95% 90% 97% 97%    Intake/Output Summary (Last 24 hours) at 01/11/13 1408 Last data filed at 01/11/13 0900  Gross per 24 hour  Intake    530 ml  Output   1200 ml  Net   -670 ml    Exam:  General: alert pleasant oriented Cardiovascular:  s1 s2 no m/r/g Respiratory: clear Abdomen: soft/nt/nd Skin rom intact Neuro grossly intact-no focal deficit  Data Reviewed: Basic Metabolic Panel:  Recent Labs Lab 01/09/13 1936 01/09/13 1951 01/10/13 0300 01/11/13 0530  NA 140 142  --  142  K 3.3* 3.4*  --  3.0*  CL 105 109  --  105  CO2 23  --   --  27  GLUCOSE 185* 185*  --  131*  BUN 22 24*  --  24*  CREATININE 2.38* 2.30* 2.38* 2.37*  CALCIUM 9.0  --   --  8.9   Liver Function Tests:  Recent  Labs Lab 01/09/13 1936  AST 9  ALT 6  ALKPHOS 148*  BILITOT 0.4  PROT 6.9  ALBUMIN 3.1*   No results found for this basename: LIPASE, AMYLASE,  in the last 168 hours No results found for this basename: AMMONIA,  in the last 168 hours CBC:  Recent Labs Lab 01/09/13 1936 01/09/13 1951 01/10/13 0300  WBC 11.6*  --  10.5  NEUTROABS 8.1*  --   --   HGB 11.9* 11.9* 10.7*  HCT 35.8* 35.0* 32.7*  MCV 89.7  --  89.6  PLT 275  --  247   Cardiac Enzymes:  Recent Labs Lab 01/09/13 1938  TROPONINI <0.30   BNP: No components found with this basename: POCBNP,  CBG:  Recent Labs Lab 01/10/13 1208 01/10/13 1658 01/10/13 2048 01/11/13 0756 01/11/13 1145  GLUCAP 138* 124* 145* 142* 122*    No results found for this or any previous visit (from the past 240 hour(s)).   Studies:              All Imaging reviewed and is as per above notation   Scheduled Meds: . aspirin  325 mg Oral Daily  . atorvastatin  40 mg Oral q morning - 10a  . calcitRIOL  0.25 mcg Oral  q morning - 10a  . cefTRIAXone (ROCEPHIN)  IV  1 g Intravenous Q24H  . citalopram  20 mg Oral q morning - 10a  . DULoxetine  30 mg Oral q morning - 10a  . furosemide  20 mg Oral q morning - 10a  . heparin  5,000 Units Subcutaneous Q8H  . insulin aspart  0-15 Units Subcutaneous TID WC  . insulin aspart  0-5 Units Subcutaneous QHS  . insulin aspart  13 Units Subcutaneous Q1200  . insulin detemir  30 Units Subcutaneous QHS  . loratadine  10 mg Oral q morning - 10a  . lubiprostone  24 mcg Oral Q breakfast  . pantoprazole  40 mg Oral q morning - 10a  . potassium chloride  10 mEq Oral q morning - 10a  . timolol  1 drop Both Eyes BID   Continuous Infusions:    Assessment/Plan:  1. Probable Benign Vertigo-Await Echo/Carotids-MRI/A shows no new deficit. Echo/carotids benign.  PT/Ot vestib rehab notes vertgo from hemianopsia and thinks would benefit from home vestib prog/OP 2. ?UTI-Follow cultures, continue  ceftriaxone 1 g 3. ? CVA-see above. Currently on aspirin 81 mg which we will change to 325 mg 4. Hyperlipidemia continue Lipitor 40 mg, h intensity statin dose 5. Diabetic nephropathy-will try to limit nephrotoxins. Cut back dose of Lasix 40 mg to 20 mg daily as might be contributing to dizziness 6. Diabetes mellitus-A1c 6.8 patient states blood sugars run high. Continue Levemir 30 units subcutaneously, novolog 13 units daily and sliding scale 7. Bipolar-continue Celexa 20 every morning, Cymbalta 30 q. A.m. 8. Hypertension-history of not on meds right now   Code Status: full Family Communication:  Dicussed w son Disposition Plan:  inpatient   Pleas Koch, MD  [facility] Pager [phone] 01/11/2013, 2:08 PM    LOS: 2 days

## 2013-01-12 LAB — BASIC METABOLIC PANEL
BUN: 25 mg/dL — ABNORMAL HIGH (ref 6–23)
GFR calc Af Amer: 21 mL/min — ABNORMAL LOW (ref >90)
GFR calc non Af Amer: 18 mL/min — ABNORMAL LOW (ref >90)
Potassium: 3.2 mEq/L — ABNORMAL LOW (ref 3.5–5.1)
Sodium: 141 mEq/L (ref 135–145)

## 2013-01-12 LAB — GLUCOSE, CAPILLARY: Glucose-Capillary: 154 mg/dL — ABNORMAL HIGH (ref 70–99)

## 2013-01-12 MED ORDER — FUROSEMIDE 40 MG PO TABS: 20.0000 mg | ORAL_TABLET | Freq: Every morning | ORAL | Status: DC

## 2013-01-12 MED ORDER — ASPIRIN 325 MG PO TABS: 325.0000 mg | ORAL_TABLET | Freq: Every day | ORAL | Status: DC

## 2013-01-12 NOTE — Discharge Summary (Signed)
Physician Discharge Summary  Lawson Isabell ZOX:[id number] DOB: 23-Sep-1945 DOA: 01/09/2013  PCP: Arlyss Queen  Admit date: 01/09/2013 Discharge date: 01/12/2013  Time spent: 35 minutes  Recommendations for Outpatient Follow-up:  1. She will need outpatient therapy for her vertigo  2. Would not treat her asymptomatic bacteria 3. 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 She relates that she was hospitalized sometime last year at [facility] and suffered a stroke affecting mainly the vision of her right eye and has never been normal since. Nevertheless she did not give this history on admission and a full workup ensued for new onset stroke. Therapy saw the patient and noted she had some hemianopsia and was not compensating well with this-please see below for further details   Hospital Course:   1. Probable Benign Vertigo--MRI/A shows no new deficit. Echo/carotids benign. PT/Ot vestib rehab notes vertigo from hemianopsia and thinks would benefit from home vestib prog/OP. This has been ordered for her and she was given a prescription for the same 2. 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 ? CVA-see  above. Currently on aspirin 81 mg which we will change to 325 mg for secondary stroke prevention 4. Hyperlipidemia continue Lipitor 40 mg, high intensity statin dose 5. Diabetic nephropathy-will try to limit nephrotoxins. Cut back dose of Lasix 40 mg to 20 mg daily as might be contributing to dizziness. In addition her creatinine rose slightly during hospital stay and she'll need close followup as an outpatient with her nephrologist 6. CkD stage III-monitor closely 7. Diabetes mellitus-A1c 6.8 patient states blood sugars run high. Continue Levemir 30 units subcutaneously, novolog 13 units daily and sliding scale 8. Bipolar-continue Celexa 20 every morning, Cymbalta 30 q. A.m. 9. Hypertension-history of not on meds right now 10. Anemia-patient does get aranesp in she's been using this as an outpatientjections-   Consultants:  None currently Procedures:  Carotids 7/12-39% stenosis ICD  ECHO 60-65% 7/11 normal wm, no anomally  MRI/A brain 7/11-NO acute infarct, old occipital infarct-MRA shows no occlusion/prox stenosis Antibiotics:  Rocephin 01/09/13   Discharge Exam: Filed Vitals:   01/11/13 1400 01/11/13 1809 01/11/13 2109 01/12/13 0550  BP: 118/75 113/65 130/74 137/81  Pulse: 97 70 69 72  Temp: 97.5 F (36.4 C) 97.4 F (36.3 C) 98.3 F (36.8 C) 98.4 F (36.9 C)  TempSrc: Oral Oral Oral Oral  Resp: 20 20 18 18   Height:      Weight:   75.2 kg (165 lb 12.6 oz)   SpO2: 98% 98% 96% 96%   Alert pleasant oriented no apparent distress  General:  extraocular movements intact, no pallor or icterus  Cardiovascular:  S1-S2 no murmur rub or gallop  Respiratory:  clinically clear   Discharge Instructions  Discharge  Orders   Future Appointments Provider Department Dept Phone   01/24/2013 8:45 AM [facility] [phone]   Future Orders Complete By Expires     Diet - low sodium heart healthy  As directed     Discharge  instructions  As directed     Comments:      Follow with your eye clinic regarding the issues related to your vision We will get you some help at home to give your compensatory mechanisms for this with therapy    Increase activity slowly  As directed         Medication List    STOP taking these medications       aspirin EC 81 MG tablet  Replaced by:  aspirin 325 MG tablet      TAKE these medications       albuterol (2.5 MG/3ML) 0.083% nebulizer solution  Commonly known as:  PROVENTIL  Take 2.5 mg by nebulization every 6 (six) hours as needed for wheezing. For wheezing     aspirin 325 MG tablet  Take 1 tablet (325 mg total) by mouth daily.     atorvastatin 40 MG tablet  Commonly known as:  LIPITOR  Take 40 mg by mouth every morning.     calcitRIOL 0.25 MCG capsule  Commonly known as:  ROCALTROL  Take 0.25 mcg by mouth every morning.     citalopram 20 MG tablet  Commonly known as:  CELEXA  Take 20 mg by mouth every morning.     DULoxetine 30 MG capsule  Commonly known as:  CYMBALTA  Take 30 mg by mouth every morning.     furosemide 40 MG tablet  Commonly known as:  LASIX  Take 40 mg by mouth every morning.     insulin aspart 100 UNIT/ML injection  Commonly known as:  novoLOG  Inject 13 Units into the skin daily. Takes around 1100-1200 daily     insulin detemir 100 UNIT/ML injection  Commonly known as:  LEVEMIR  Inject 30 Units into the skin at bedtime.     loratadine 10 MG tablet  Commonly known as:  CLARITIN  Take 10 mg by mouth every morning.     lubiprostone 24 MCG capsule  Commonly known as:  AMITIZA  Take 24 mcg by mouth daily with breakfast.     pantoprazole 40 MG tablet  Commonly known as:  PROTONIX  Take 40 mg by mouth every morning.     potassium chloride 10 MEQ tablet  Commonly known as:  K-DUR  Take 10 mEq by mouth every morning.     timolol 0.5 % ophthalmic solution  Commonly known as:  BETIMOL  Place 1 drop into both eyes 2 (two) times  daily.       Allergies  Allergen Reactions  . Codeine Itching and Nausea And Vomiting      The results of significant diagnostics from this hospitalization (including imaging, microbiology, ancillary and laboratory) are listed below for reference.    Significant Diagnostic Studies: Dg Chest 2 View  01/09/2013   *RADIOLOGY REPORT*  Clinical Data: Dizziness.  CHEST - 2 VIEW  Comparison: 06/14/2012  Findings: The cardiomediastinal silhouette is unremarkable. The lungs are clear. There is no evidence of focal airspace disease, pulmonary edema, suspicious pulmonary nodule/mass, pleural effusion, or pneumothorax. No acute bony abnormalities are identified.  IMPRESSION: No evidence of acute cardiopulmonary disease.   Original Report Authenticated By: Harmon Pier, M.D.  Ct Head (brain) Wo Contrast  01/09/2013   *RADIOLOGY REPORT*  Clinical Data: 67 year old female with headache, dizziness and head injury.  CT HEAD WITHOUT CONTRAST  Technique:  Contiguous axial images were obtained from the base of the skull through the vertex without contrast.  Comparison: 06/16/2011 head CT  Findings: Chronic small vessel white matter ischemic changes are again noted. Remote infarcts in the left occipital region and right basal ganglia noted.  No acute intracranial abnormalities are identified, including mass lesion or mass effect, hydrocephalus, extra-axial fluid collection, midline shift, hemorrhage, or acute infarction.  The visualized bony calvarium is unremarkable.  IMPRESSION: No evidence of acute intracranial abnormality.  Chronic small vessel white matter ischemic changes and remote infarcts as described.   Original Report Authenticated By: Harmon Pier, M.D.   Mri Brain Without Contrast  01/10/2013   *RADIOLOGY REPORT*  Clinical Data:  Vertigo.  Diabetes and hypertension.  MRI HEAD WITHOUT CONTRAST MRA HEAD WITHOUT CONTRAST  Technique:  Multiplanar, multiecho pulse sequences of the brain and surrounding  structures were obtained without intravenous contrast. Angiographic images of the head were obtained using MRA technique without contrast.  Comparison:  CT 01/09/2013  MRI HEAD  Findings:  Diffusion imaging does not show any acute or subacute infarction.  The brainstem and cerebellum are normal.  There is an old infarction in the right basal ganglia and radiating white matter tracts.  There are old infarctions in the left external capsule and radiating white matter tracts. There is an old infarction in the left occipital lobe.  No mass lesion, hemorrhage, hydrocephalus or extra-axial collection.  No pituitary mass.  No inflammatory sinus disease.  IMPRESSION: No acute infarction.  Old left occipital infarction.  Old infarctions in the basal ganglia and radiating white matter tracts.  MRA HEAD  Findings: Both internal carotid arteries are widely patent into the brain.  The anterior and middle cerebral vessels are patent without proximal stenosis, aneurysm or vascular malformation.  There is moderate atherosclerotic irregularity in the right M1 segment.  Both vertebral arteries are widely patent to the basilar.  No basilar stenosis.  Posterior circulation branch vessels are patent. There is mild atherosclerotic irregularity in the PCA branches.  IMPRESSION: No major vessel occlusion or correctable proximal stenosis.  Medium vessel atherosclerotic irregularity most notable on the right M1 segment and in the PCA branches.   Original Report Authenticated By: Paulina Fusi, M.D.   Mr Mra Head/brain Wo Cm  01/10/2013   *RADIOLOGY REPORT*  Clinical Data:  Vertigo.  Diabetes and hypertension.  MRI HEAD WITHOUT CONTRAST MRA HEAD WITHOUT CONTRAST  Technique:  Multiplanar, multiecho pulse sequences of the brain and surrounding structures were obtained without intravenous contrast. Angiographic images of the head were obtained using MRA technique without contrast.  Comparison:  CT 01/09/2013  MRI HEAD  Findings:  Diffusion  imaging does not show any acute or subacute infarction.  The brainstem and cerebellum are normal.  There is an old infarction in the right basal ganglia and radiating white matter tracts.  There are old infarctions in the left external capsule and radiating white matter tracts. There is an old infarction in the left occipital lobe.  No mass lesion, hemorrhage, hydrocephalus or extra-axial collection.  No pituitary mass.  No inflammatory sinus disease.  IMPRESSION: No acute infarction.  Old left occipital infarction.  Old infarctions in the basal ganglia and radiating white matter tracts.  MRA HEAD  Findings: Both internal carotid arteries are widely patent into the brain.  The anterior and middle cerebral vessels are patent without proximal stenosis, aneurysm or vascular malformation.  There is moderate atherosclerotic irregularity in the right M1 segment.  Both vertebral arteries are widely patent to the basilar.  No basilar stenosis.  Posterior circulation branch vessels are patent. There is mild atherosclerotic irregularity in the PCA branches.  IMPRESSION: No major vessel occlusion or correctable proximal stenosis.  Medium vessel atherosclerotic irregularity most notable on the right M1 segment and in the PCA branches.   Original Report Authenticated By: Paulina Fusi, M.D.    Microbiology: Recent Results (from the past 240 hour(s))  URINE CULTURE     Status: None   Collection Time    01/09/13  7:55 PM      Result Value Range Status   Specimen Description URINE, CLEAN CATCH   Final   Special Requests NONE   Final   Culture  Setup Time 01/10/2013 01:46   Final   Colony Count >=100,000 COLONIES/ML   Final   Culture     Final   Value: Multiple bacterial morphotypes present, none predominant. Suggest appropriate recollection if clinically indicated.   Report Status 01/11/2013 FINAL   Final     Labs: Basic Metabolic Panel:  Recent Labs Lab 01/09/13 1936 01/09/13 1951 01/10/13 0300  01/11/13 0530 01/12/13 0625  NA 140 142  --  142 141  K 3.3* 3.4*  --  3.0* 3.2*  CL 105 109  --  105 105  CO2 23  --   --  27 22  GLUCOSE 185* 185*  --  131* 151*  BUN 22 24*  --  24* 25*  CREATININE 2.38* 2.30* 2.38* 2.37* 2.58*  CALCIUM 9.0  --   --  8.9 9.0   Liver Function Tests:  Recent Labs Lab 01/09/13 1936  AST 9  ALT 6  ALKPHOS 148*  BILITOT 0.4  PROT 6.9  ALBUMIN 3.1*   No results found for this basename: LIPASE, AMYLASE,  in the last 168 hours No results found for this basename: AMMONIA,  in the last 168 hours CBC:  Recent Labs Lab 01/09/13 1936 01/09/13 1951 01/10/13 0300  WBC 11.6*  --  10.5  NEUTROABS 8.1*  --   --   HGB 11.9* 11.9* 10.7*  HCT 35.8* 35.0* 32.7*  MCV 89.7  --  89.6  PLT 275  --  247   Cardiac Enzymes:  Recent Labs Lab 01/09/13 1938  TROPONINI <0.30   BNP: BNP (last 3 results) No results found for this basename: PROBNP,  in the last 8760 hours CBG:  Recent Labs Lab 01/11/13 0756 01/11/13 1145 01/11/13 1629 01/11/13 2113 01/12/13 0735  GLUCAP 142* 122* 93 139* 154*       Signed:  Sundi Slevin, JAI-GURMUKH  [facility] 01/12/2013, 8:49 AM

## 2013-01-24 ENCOUNTER — Encounter (HOSPITAL_COMMUNITY)
Admission: RE | Admit: 2013-01-24 | Discharge: 2013-01-24 | Disposition: A | Discharge status: Home / Self Care | Payer: Medicare Other | Source: Ambulatory Visit | Attending: Nephrology | Admitting: Nephrology

## 2013-01-24 DIAGNOSIS — D509 Iron deficiency anemia, unspecified: Secondary | ICD-10-CM | POA: Insufficient documentation

## 2013-01-24 LAB — IRON AND TIBC
Iron: 50 ug/dL (ref 42–135)
Saturation Ratios: 21 % (ref 20–55)
TIBC: 242 ug/dL — ABNORMAL LOW (ref 250–470)
UIBC: 192 ug/dL (ref 125–400)

## 2013-01-24 MED ORDER — EPOETIN ALFA 20000 UNIT/ML IJ SOLN: 20000.0000 [IU] | INTRAMUSCULAR | Status: DC

## 2013-01-24 MED ORDER — EPOETIN ALFA 20000 UNIT/ML IJ SOLN
INTRAMUSCULAR | Status: AC
  Administered 2013-01-24: 20000 [IU] via SUBCUTANEOUS
  Filled 2013-01-24: qty 1

## 2013-02-20 ENCOUNTER — Other Ambulatory Visit (HOSPITAL_COMMUNITY): Payer: Self-pay | Admitting: *Deleted

## 2013-02-21 ENCOUNTER — Encounter (HOSPITAL_COMMUNITY): Payer: Medicare Other

## 2013-03-05 ENCOUNTER — Ambulatory Visit: Payer: Self-pay | Admitting: Ophthalmology

## 2013-03-07 ENCOUNTER — Encounter (HOSPITAL_COMMUNITY)
Admission: RE | Admit: 2013-03-07 | Discharge: 2013-03-07 | Disposition: A | Discharge status: Home / Self Care | Payer: Medicare Other | Source: Ambulatory Visit | Attending: Nephrology | Admitting: Nephrology

## 2013-03-07 DIAGNOSIS — D631 Anemia in chronic kidney disease: Secondary | ICD-10-CM | POA: Insufficient documentation

## 2013-03-07 DIAGNOSIS — I120 Hypertensive chronic kidney disease with stage 5 chronic kidney disease or end stage renal disease: Secondary | ICD-10-CM | POA: Insufficient documentation

## 2013-03-07 DIAGNOSIS — N186 End stage renal disease: Secondary | ICD-10-CM | POA: Insufficient documentation

## 2013-03-07 LAB — POCT HEMOGLOBIN-HEMACUE: Hemoglobin: 11.5 g/dL — ABNORMAL LOW (ref 12.0–15.0)

## 2013-03-07 LAB — IRON AND TIBC
Iron: 71 ug/dL (ref 42–135)
Saturation Ratios: 32 % (ref 20–55)
TIBC: 225 ug/dL — ABNORMAL LOW (ref 250–470)
UIBC: 154 ug/dL (ref 125–400)

## 2013-03-07 MED ORDER — EPOETIN ALFA 20000 UNIT/ML IJ SOLN
INTRAMUSCULAR | Status: AC
  Administered 2013-03-07: 15000 [IU] via SUBCUTANEOUS
  Filled 2013-03-07: qty 1

## 2013-03-07 MED ORDER — EPOETIN ALFA 20000 UNIT/ML IJ SOLN
15000.0000 [IU] | INTRAMUSCULAR | Status: DC
Start: 2013-03-07 — End: 2013-03-08

## 2013-03-19 ENCOUNTER — Ambulatory Visit: Payer: Self-pay | Admitting: Ophthalmology

## 2013-04-03 ENCOUNTER — Other Ambulatory Visit (HOSPITAL_COMMUNITY): Payer: Self-pay | Admitting: *Deleted

## 2013-04-04 ENCOUNTER — Encounter (HOSPITAL_COMMUNITY)
Admission: RE | Admit: 2013-04-04 | Discharge: 2013-04-04 | Disposition: A | Discharge status: Home / Self Care | Payer: Medicare Other | Source: Ambulatory Visit | Attending: Nephrology | Admitting: Nephrology

## 2013-04-04 DIAGNOSIS — N186 End stage renal disease: Secondary | ICD-10-CM | POA: Insufficient documentation

## 2013-04-04 DIAGNOSIS — I120 Hypertensive chronic kidney disease with stage 5 chronic kidney disease or end stage renal disease: Secondary | ICD-10-CM | POA: Insufficient documentation

## 2013-04-04 DIAGNOSIS — D631 Anemia in chronic kidney disease: Secondary | ICD-10-CM | POA: Insufficient documentation

## 2013-04-04 LAB — IRON AND TIBC
Iron: 63 ug/dL (ref 42–135)
UIBC: 174 ug/dL (ref 125–400)

## 2013-04-04 LAB — FERRITIN: Ferritin: 359 ng/mL — ABNORMAL HIGH (ref 10–291)

## 2013-04-04 MED ORDER — EPOETIN ALFA 20000 UNIT/ML IJ SOLN
INTRAMUSCULAR | Status: AC
  Administered 2013-04-04: 10:00:00 20000 [IU] via SUBCUTANEOUS
  Filled 2013-04-04: qty 1

## 2013-04-04 MED ORDER — EPOETIN ALFA 20000 UNIT/ML IJ SOLN: 20000.0000 [IU] | INTRAMUSCULAR | Status: DC

## 2013-05-02 ENCOUNTER — Encounter (HOSPITAL_COMMUNITY)
Admission: RE | Admit: 2013-05-02 | Discharge: 2013-05-02 | Disposition: A | Discharge status: Home / Self Care | Payer: Medicare Other | Source: Ambulatory Visit | Attending: Nephrology | Admitting: Nephrology

## 2013-05-02 LAB — IRON AND TIBC
Iron: 66 ug/dL (ref 42–135)
Saturation Ratios: 28 % (ref 20–55)
TIBC: 236 ug/dL — ABNORMAL LOW (ref 250–470)
UIBC: 170 ug/dL (ref 125–400)

## 2013-05-02 MED ORDER — EPOETIN ALFA 20000 UNIT/ML IJ SOLN: 20000.0000 [IU] | INTRAMUSCULAR | Status: DC

## 2013-05-16 ENCOUNTER — Encounter (HOSPITAL_COMMUNITY)
Admission: RE | Admit: 2013-05-16 | Discharge: 2013-05-16 | Disposition: A | Discharge status: Home / Self Care | Payer: Medicare Other | Source: Ambulatory Visit | Attending: Nephrology | Admitting: Nephrology

## 2013-05-16 DIAGNOSIS — D509 Iron deficiency anemia, unspecified: Secondary | ICD-10-CM | POA: Insufficient documentation

## 2013-05-16 LAB — FERRITIN: Ferritin: 389 ng/mL — ABNORMAL HIGH (ref 10–291)

## 2013-05-16 MED ORDER — EPOETIN ALFA 20000 UNIT/ML IJ SOLN
INTRAMUSCULAR | Status: AC
  Filled 2013-05-16: qty 1

## 2013-05-16 MED ORDER — EPOETIN ALFA 20000 UNIT/ML IJ SOLN
20000.0000 [IU] | INTRAMUSCULAR | Status: DC
  Administered 2013-05-16: 20000 [IU] via SUBCUTANEOUS

## 2013-05-24 ENCOUNTER — Observation Stay (HOSPITAL_COMMUNITY)
Admission: EM | Admit: 2013-05-24 | Discharge: 2013-05-26 | Disposition: A | Discharge status: Home / Self Care | Payer: Medicare Other | Attending: Internal Medicine | Admitting: Internal Medicine

## 2013-05-24 ENCOUNTER — Emergency Department (HOSPITAL_COMMUNITY): Payer: Medicare Other

## 2013-05-24 ENCOUNTER — Encounter (HOSPITAL_COMMUNITY): Payer: Self-pay | Admitting: Emergency Medicine

## 2013-05-24 DIAGNOSIS — K3189 Other diseases of stomach and duodenum: Secondary | ICD-10-CM

## 2013-05-24 DIAGNOSIS — R1310 Dysphagia, unspecified: Secondary | ICD-10-CM

## 2013-05-24 DIAGNOSIS — I6529 Occlusion and stenosis of unspecified carotid artery: Secondary | ICD-10-CM | POA: Insufficient documentation

## 2013-05-24 DIAGNOSIS — E1121 Type 2 diabetes mellitus with diabetic nephropathy: Secondary | ICD-10-CM

## 2013-05-24 DIAGNOSIS — E876 Hypokalemia: Secondary | ICD-10-CM | POA: Insufficient documentation

## 2013-05-24 DIAGNOSIS — E7800 Pure hypercholesterolemia, unspecified: Secondary | ICD-10-CM

## 2013-05-24 DIAGNOSIS — I129 Hypertensive chronic kidney disease with stage 1 through stage 4 chronic kidney disease, or unspecified chronic kidney disease: Secondary | ICD-10-CM | POA: Insufficient documentation

## 2013-05-24 DIAGNOSIS — D509 Iron deficiency anemia, unspecified: Secondary | ICD-10-CM

## 2013-05-24 DIAGNOSIS — R262 Difficulty in walking, not elsewhere classified: Secondary | ICD-10-CM | POA: Insufficient documentation

## 2013-05-24 DIAGNOSIS — R42 Dizziness and giddiness: Secondary | ICD-10-CM

## 2013-05-24 DIAGNOSIS — M316 Other giant cell arteritis: Secondary | ICD-10-CM

## 2013-05-24 DIAGNOSIS — R269 Unspecified abnormalities of gait and mobility: Secondary | ICD-10-CM | POA: Insufficient documentation

## 2013-05-24 DIAGNOSIS — H5440 Blindness, one eye, unspecified eye: Secondary | ICD-10-CM | POA: Insufficient documentation

## 2013-05-24 DIAGNOSIS — E119 Type 2 diabetes mellitus without complications: Secondary | ICD-10-CM | POA: Insufficient documentation

## 2013-05-24 DIAGNOSIS — J449 Chronic obstructive pulmonary disease, unspecified: Secondary | ICD-10-CM | POA: Diagnosis present

## 2013-05-24 DIAGNOSIS — R1013 Epigastric pain: Secondary | ICD-10-CM

## 2013-05-24 DIAGNOSIS — R531 Weakness: Secondary | ICD-10-CM

## 2013-05-24 DIAGNOSIS — K317 Polyp of stomach and duodenum: Secondary | ICD-10-CM

## 2013-05-24 DIAGNOSIS — I10 Essential (primary) hypertension: Secondary | ICD-10-CM

## 2013-05-24 DIAGNOSIS — M6281 Muscle weakness (generalized): Principal | ICD-10-CM | POA: Insufficient documentation

## 2013-05-24 DIAGNOSIS — F411 Generalized anxiety disorder: Secondary | ICD-10-CM | POA: Insufficient documentation

## 2013-05-24 DIAGNOSIS — N390 Urinary tract infection, site not specified: Secondary | ICD-10-CM

## 2013-05-24 DIAGNOSIS — N189 Chronic kidney disease, unspecified: Secondary | ICD-10-CM

## 2013-05-24 DIAGNOSIS — J4489 Other specified chronic obstructive pulmonary disease: Secondary | ICD-10-CM | POA: Insufficient documentation

## 2013-05-24 DIAGNOSIS — K219 Gastro-esophageal reflux disease without esophagitis: Secondary | ICD-10-CM | POA: Insufficient documentation

## 2013-05-24 DIAGNOSIS — Z8673 Personal history of transient ischemic attack (TIA), and cerebral infarction without residual deficits: Secondary | ICD-10-CM | POA: Insufficient documentation

## 2013-05-24 NOTE — ED Provider Notes (Signed)
CSN: [id number]     Arrival date & time 05/24/13  2226 History   First MD Initiated Contact with Patient 05/24/13 2344     Chief Complaint  Patient presents with  . Extremity Weakness   (Consider location/radiation/quality/duration/timing/severity/associated sxs/prior Treatment) HPI Complains of dizziness for 3 days. Admits to feeling off balance. Denies weakness in one arm or one leg denies difficulty speaking denies visual changes denies other complaint symptoms worse with movement improved with remaining still. No treatment prior to coming here she has fallen though did not injure herself. hno nausea or vomittingPast Medical History  Diagnosis Date  . Diabetes mellitus   . Hypertension   . COPD (chronic obstructive pulmonary disease)   . GERD (gastroesophageal reflux disease)   . Hypercholesteremia   . Anxiety   . Stroke   . Headaches due to old head injury   . Blind one eye   . Angina   . Shortness of breath   . Pneumonia   . Recurrent upper respiratory infection (URI)   . Anemia   . Arthritis   . Complication of anesthesia     difficulty waking up  . Chronic kidney disease   . Cancer     skin cancer right thumb  . Temporal arteritis 11/17/2011  . Gastric polyp     antral  . Stenosis of esophagus    Past Surgical History  Procedure Laterality Date  . Abdominal hysterectomy    . Cholecystectomy    . Heel spur surgery      bilateral  . Brain surgery    . Esophagogastroduodenoscopy  11/18/2011    Procedure: ESOPHAGOGASTRODUODENOSCOPY (EGD);  Surgeon: Iva Boop, MD;  Location: [facility] ENDOSCOPY;  Service: Endoscopy;  Laterality: N/A;   Family History  Problem Relation Age of Onset  . Colon cancer     History  Substance Use Topics  . Smoking status: Never Smoker   . Smokeless tobacco: Never Used  . Alcohol Use: No   OB History   Grav Para Term Preterm Abortions TAB SAB Ect Mult Living                 Review of Systems  Neurological: Positive for dizziness.   All other systems reviewed and are negative.    Allergies  Codeine  Home Medications   Current Outpatient Rx  Name  Route  Sig  Dispense  Refill  . albuterol (PROVENTIL) (2.5 MG/3ML) 0.083% nebulizer solution   Nebulization   Take 2.5 mg by nebulization every 6 (six) hours as needed for wheezing. For wheezing         . aspirin 325 MG tablet   Oral   Take 1 tablet (325 mg total) by mouth daily.         Marland Kitchen atorvastatin (LIPITOR) 40 MG tablet   Oral   Take 40 mg by mouth every morning.          . calcitRIOL (ROCALTROL) 0.25 MCG capsule   Oral   Take 0.25 mcg by mouth every morning.         . citalopram (CELEXA) 20 MG tablet   Oral   Take 20 mg by mouth every morning.         . DULoxetine (CYMBALTA) 30 MG capsule   Oral   Take 30 mg by mouth every morning.         . furosemide (LASIX) 40 MG tablet   Oral   Take 0.5 tablets (20 mg total) by mouth  every morning.   30 tablet      . insulin aspart (NOVOLOG) 100 UNIT/ML injection   Subcutaneous   Inject 13 Units into the skin daily. Takes around 1100-1200 daily         . insulin detemir (LEVEMIR) 100 UNIT/ML injection   Subcutaneous   Inject 30 Units into the skin at bedtime.          Marland Kitchen loratadine (CLARITIN) 10 MG tablet   Oral   Take 10 mg by mouth every morning.          . lubiprostone (AMITIZA) 24 MCG capsule   Oral   Take 24 mcg by mouth daily with breakfast.         . pantoprazole (PROTONIX) 40 MG tablet   Oral   Take 40 mg by mouth every morning.         . potassium chloride (K-DUR) 10 MEQ tablet   Oral   Take 10 mEq by mouth every morning.         . timolol (BETIMOL) 0.5 % ophthalmic solution   Both Eyes   Place 1 drop into both eyes 2 (two) times daily.          BP 143/72  Pulse 74  Resp 18  SpO2 98% Physical Exam  Nursing note and vitals reviewed. Constitutional: She is oriented to person, place, and time. She appears well-developed and well-nourished.  HENT:   Head: Normocephalic and atraumatic.  Eyes: Conjunctivae are normal. Pupils are equal, round, and reactive to light.  Neck: Neck supple. No tracheal deviation present. No thyromegaly present.  Cardiovascular: Normal rate and regular rhythm.   No murmur heard. Pulmonary/Chest: Effort normal and breath sounds normal.  Abdominal: Soft. Bowel sounds are normal. She exhibits no distension. There is no tenderness.  Musculoskeletal: Normal range of motion. She exhibits no edema and no tenderness.  Neurological: She is alert and oriented to person, place, and time. She has normal reflexes. No cranial nerve deficit. Coordination normal.  Gait unsteady and broad-based  Skin: Skin is warm and dry. No rash noted.  Psychiatric: She has a normal mood and affect.    ED Course  Procedures (including critical care time) Labs Review Labs Reviewed - No data to display Imaging Review Ct Head (brain) Wo Contrast  05/24/2013   CLINICAL DATA:  Dizziness and falls  EXAM: CT HEAD WITHOUT CONTRAST  TECHNIQUE: Contiguous axial images were obtained from the base of the skull through the vertex without intravenous contrast.  COMPARISON:  Prior MRI from 01/10/2013.  FINDINGS: Encephalomalacia within the left occipital lobe is unchanged, compatible with remote infarct. Hypodensity within the periventricular white matter is consistent with chronic microvascular ischemic disease, also unchanged.  There is no acute intracranial hemorrhage or infarct. No mass lesion or midline shift. Gray-white matter differentiation is well maintained. Ventricles are normal in size without evidence of hydrocephalus. CSF containing spaces are within normal limits. No extra-axial fluid collection.  The calvarium is intact.  Orbital soft tissues are within normal limits.  The paranasal sinuses and mastoid air cells are well pneumatized and free of fluid.  Scalp soft tissues are unremarkable.  IMPRESSION: 1. No acute intracranial process. 2. Stable  appearance of remote left occipital infarct and chronic microvascular ischemic changes.   Electronically Signed   By: Rise Mu M.D.   On: 05/24/2013 23:37    EKG Interpretation   None      Results for orders placed during the hospital encounter of  05/24/13  ETHANOL      Result Value Range   Alcohol, Ethyl (B) <11  0 - 11 mg/dL  PROTIME-INR      Result Value Range   Prothrombin Time 12.4  11.6 - 15.2 seconds   INR 0.94  0.00 - 1.49  APTT      Result Value Range   aPTT 31  24 - 37 seconds  CBC      Result Value Range   WBC 12.6 (*) 4.0 - 10.5 K/uL   RBC 3.44 (*) 3.87 - 5.11 MIL/uL   Hemoglobin 10.7 (*) 12.0 - 15.0 g/dL   HCT 45.4 (*) 09.8 - 11.9 %   MCV 90.1  78.0 - 100.0 fL   MCH 31.1  26.0 - 34.0 pg   MCHC 34.5  30.0 - 36.0 g/dL   RDW 14.7  82.9 - 56.2 %   Platelets 272  150 - 400 K/uL  DIFFERENTIAL      Result Value Range   Neutrophils Relative % 68  43 - 77 %   Neutro Abs 8.6 (*) 1.7 - 7.7 K/uL   Lymphocytes Relative 24  12 - 46 %   Lymphs Abs 3.0  0.7 - 4.0 K/uL   Monocytes Relative 6  3 - 12 %   Monocytes Absolute 0.7  0.1 - 1.0 K/uL   Eosinophils Relative 2  0 - 5 %   Eosinophils Absolute 0.3  0.0 - 0.7 K/uL   Basophils Relative 0  0 - 1 %   Basophils Absolute 0.0  0.0 - 0.1 K/uL  COMPREHENSIVE METABOLIC PANEL      Result Value Range   Sodium 137  135 - 145 mEq/L   Potassium 2.8 (*) 3.5 - 5.1 mEq/L   Chloride 102  96 - 112 mEq/L   CO2 23  19 - 32 mEq/L   Glucose, Bld 169 (*) 70 - 99 mg/dL   BUN 27 (*) 6 - 23 mg/dL   Creatinine, Ser 1.30 (*) 0.50 - 1.10 mg/dL   Calcium 8.5  8.4 - 86.5 mg/dL   Total Protein 6.6  6.0 - 8.3 g/dL   Albumin 3.1 (*) 3.5 - 5.2 g/dL   AST 8  0 - 37 U/L   ALT <5  0 - 35 U/L   Alkaline Phosphatase 142 (*) 39 - 117 U/L   Total Bilirubin 0.3  0.3 - 1.2 mg/dL   GFR calc non Af Amer 15 (*) >90 mL/min   GFR calc Af Amer 18 (*) >90 mL/min  URINE RAPID DRUG SCREEN (HOSP PERFORMED)      Result Value Range   Opiates NONE  DETECTED  NONE DETECTED   Cocaine NONE DETECTED  NONE DETECTED   Benzodiazepines NONE DETECTED  NONE DETECTED   Amphetamines NONE DETECTED  NONE DETECTED   Tetrahydrocannabinol NONE DETECTED  NONE DETECTED   Barbiturates NONE DETECTED  NONE DETECTED  URINALYSIS, ROUTINE W REFLEX MICROSCOPIC      Result Value Range   Color, Urine YELLOW  YELLOW   APPearance CLOUDY (*) CLEAR   Specific Gravity, Urine 1.013  1.005 - 1.030   pH 5.5  5.0 - 8.0   Glucose, UA 250 (*) NEGATIVE mg/dL   Hgb urine dipstick NEGATIVE  NEGATIVE   Bilirubin Urine NEGATIVE  NEGATIVE   Ketones, ur NEGATIVE  NEGATIVE mg/dL   Protein, ur 30 (*) NEGATIVE mg/dL   Urobilinogen, UA 1.0  0.0 - 1.0 mg/dL   Nitrite NEGATIVE  NEGATIVE  Leukocytes, UA TRACE (*) NEGATIVE  URINE MICROSCOPIC-ADD ON      Result Value Range   Squamous Epithelial / LPF MANY (*) RARE   WBC, UA 3-6  <3 WBC/hpf   RBC / HPF 0-2  <3 RBC/hpf   Bacteria, UA MANY (*) RARE   Casts HYALINE CASTS (*) NEGATIVE  POCT I-STAT TROPONIN I      Result Value Range   Troponin i, poc 0.00  0.00 - 0.08 ng/mL   Comment 3            Ct Head (brain) Wo Contrast  05/24/2013   CLINICAL DATA:  Dizziness and falls  EXAM: CT HEAD WITHOUT CONTRAST  TECHNIQUE: Contiguous axial images were obtained from the base of the skull through the vertex without intravenous contrast.  COMPARISON:  Prior MRI from 01/10/2013.  FINDINGS: Encephalomalacia within the left occipital lobe is unchanged, compatible with remote infarct. Hypodensity within the periventricular white matter is consistent with chronic microvascular ischemic disease, also unchanged.  There is no acute intracranial hemorrhage or infarct. No mass lesion or midline shift. Gray-white matter differentiation is well maintained. Ventricles are normal in size without evidence of hydrocephalus. CSF containing spaces are within normal limits. No extra-axial fluid collection.  The calvarium is intact.  Orbital soft tissues are within  normal limits.  The paranasal sinuses and mastoid air cells are well pneumatized and free of fluid.  Scalp soft tissues are unremarkable.  IMPRESSION: 1. No acute intracranial process. 2. Stable appearance of remote left occipital infarct and chronic microvascular ischemic changes.   Electronically Signed   By: Rise Mu M.D.   On: 05/24/2013 23:37    Date: 05/25/2013  Rate: 80  Rhythm: normal sinus rhythm  QRS Axis: normal  Intervals: normal  ST/T Wave abnormalities: nonspecific T wave changes  Conduction Disutrbances:none  Narrative Interpretation:   Old EKG Reviewed: No significant change from July 10th 2014 interpreted by me  MDM  No diagnosis found. Concern for posterior circulation stroke or central cause of vertigo in light of history of diabetes, history of stroke. Spoke with Dr. Lovell Sheehan Plan 23 hour observation, MRI., Telemetry Diagnosis #1 dizziness #2 renal insufficiency  #3 hypokalemia    Doug Sou, MD 05/25/13 [phone]

## 2013-05-24 NOTE — ED Notes (Addendum)
Pt has had unilateral left sided weakness starting 3 days ago. Left side drift seen in arm, left side drift in leg. Pt able to resist gravity. Last seen normal 3 days ago. Pt able to control head and saliva and airway

## 2013-05-25 ENCOUNTER — Encounter (HOSPITAL_COMMUNITY): Payer: Self-pay | Admitting: *Deleted

## 2013-05-25 ENCOUNTER — Observation Stay (HOSPITAL_COMMUNITY): Payer: Medicare Other

## 2013-05-25 DIAGNOSIS — R42 Dizziness and giddiness: Secondary | ICD-10-CM

## 2013-05-25 DIAGNOSIS — M6281 Muscle weakness (generalized): Secondary | ICD-10-CM

## 2013-05-25 DIAGNOSIS — E119 Type 2 diabetes mellitus without complications: Secondary | ICD-10-CM

## 2013-05-25 DIAGNOSIS — N189 Chronic kidney disease, unspecified: Secondary | ICD-10-CM | POA: Diagnosis present

## 2013-05-25 DIAGNOSIS — J449 Chronic obstructive pulmonary disease, unspecified: Secondary | ICD-10-CM

## 2013-05-25 DIAGNOSIS — E7800 Pure hypercholesterolemia, unspecified: Secondary | ICD-10-CM | POA: Diagnosis present

## 2013-05-25 DIAGNOSIS — R531 Weakness: Secondary | ICD-10-CM | POA: Diagnosis present

## 2013-05-25 DIAGNOSIS — D509 Iron deficiency anemia, unspecified: Secondary | ICD-10-CM

## 2013-05-25 DIAGNOSIS — E876 Hypokalemia: Secondary | ICD-10-CM | POA: Diagnosis present

## 2013-05-25 DIAGNOSIS — I10 Essential (primary) hypertension: Secondary | ICD-10-CM

## 2013-05-25 LAB — RAPID URINE DRUG SCREEN, HOSP PERFORMED
Amphetamines: NOT DETECTED
Barbiturates: NOT DETECTED
Cocaine: NOT DETECTED
Opiates: NOT DETECTED
Tetrahydrocannabinol: NOT DETECTED

## 2013-05-25 LAB — COMPREHENSIVE METABOLIC PANEL
AST: 8 U/L (ref 0–37)
Albumin: 3.1 g/dL — ABNORMAL LOW (ref 3.5–5.2)
Alkaline Phosphatase: 142 U/L — ABNORMAL HIGH (ref 39–117)
BUN: 27 mg/dL — ABNORMAL HIGH (ref 6–23)
Calcium: 8.5 mg/dL (ref 8.4–10.5)
Chloride: 102 mEq/L (ref 96–112)
Creatinine, Ser: 2.92 mg/dL — ABNORMAL HIGH (ref 0.50–1.10)
Potassium: 2.8 mEq/L — ABNORMAL LOW (ref 3.5–5.1)
Total Protein: 6.6 g/dL (ref 6.0–8.3)

## 2013-05-25 LAB — ETHANOL: Alcohol, Ethyl (B): <11 mg/dL (ref 0–11)

## 2013-05-25 LAB — LIPID PANEL
HDL: 24 mg/dL — ABNORMAL LOW (ref >39)
LDL Cholesterol: 65 mg/dL (ref 0–99)
Total CHOL/HDL Ratio: 6.3 RATIO
Triglycerides: 305 mg/dL — ABNORMAL HIGH (ref <150)
VLDL: 61 mg/dL — ABNORMAL HIGH (ref 0–40)

## 2013-05-25 LAB — PROTIME-INR
INR: 0.94 (ref 0.00–1.49)
Prothrombin Time: 12.4 seconds (ref 11.6–15.2)

## 2013-05-25 LAB — DIFFERENTIAL
Basophils Absolute: 0 10*3/uL (ref 0.0–0.1)
Basophils Relative: 0 % (ref 0–1)
Eosinophils Absolute: 0.3 10*3/uL (ref 0.0–0.7)
Eosinophils Relative: 2 % (ref 0–5)
Monocytes Relative: 6 % (ref 3–12)
Neutro Abs: 8.6 10*3/uL — ABNORMAL HIGH (ref 1.7–7.7)
Neutrophils Relative %: 68 % (ref 43–77)

## 2013-05-25 LAB — POCT I-STAT TROPONIN I: Troponin i, poc: 0 ng/mL (ref 0.00–0.08)

## 2013-05-25 LAB — URINALYSIS, ROUTINE W REFLEX MICROSCOPIC
Bilirubin Urine: NEGATIVE
Glucose, UA: 250 mg/dL — AB
Hgb urine dipstick: NEGATIVE
Nitrite: NEGATIVE
Protein, ur: 30 mg/dL — AB
Urobilinogen, UA: 1 mg/dL (ref 0.0–1.0)

## 2013-05-25 LAB — CBC
Hemoglobin: 10.7 g/dL — ABNORMAL LOW (ref 12.0–15.0)
MCH: 31.1 pg (ref 26.0–34.0)
MCHC: 34.5 g/dL (ref 30.0–36.0)
Platelets: 272 10*3/uL (ref 150–400)
RDW: 14 % (ref 11.5–15.5)

## 2013-05-25 LAB — APTT: aPTT: 31 seconds (ref 24–37)

## 2013-05-25 LAB — URINE MICROSCOPIC-ADD ON

## 2013-05-25 LAB — GLUCOSE, CAPILLARY
Glucose-Capillary: 149 mg/dL — ABNORMAL HIGH (ref 70–99)
Glucose-Capillary: 129 mg/dL — ABNORMAL HIGH (ref 70–99)
Glucose-Capillary: 134 mg/dL — ABNORMAL HIGH (ref 70–99)

## 2013-05-25 LAB — HEMOGLOBIN A1C: Hgb A1c MFr Bld: 7.2 % — ABNORMAL HIGH (ref <5.7)

## 2013-05-25 MED ORDER — PANTOPRAZOLE SODIUM 40 MG PO TBEC
40.0000 mg | DELAYED_RELEASE_TABLET | Freq: Every morning | ORAL | Status: DC
  Administered 2013-05-25 – 2013-05-26 (×2): 40 mg via ORAL
  Filled 2013-05-25 (×2): qty 1

## 2013-05-25 MED ORDER — ASPIRIN 300 MG RE SUPP
300.0000 mg | Freq: Every day | RECTAL | Status: DC
  Filled 2013-05-25 (×2): qty 1

## 2013-05-25 MED ORDER — INSULIN DETEMIR 100 UNIT/ML ~~LOC~~ SOLN
30.0000 [IU] | Freq: Every day | SUBCUTANEOUS | Status: DC
  Administered 2013-05-25: 30 [IU] via SUBCUTANEOUS
  Filled 2013-05-25 (×2): qty 0.3

## 2013-05-25 MED ORDER — ATORVASTATIN CALCIUM 40 MG PO TABS
40.0000 mg | ORAL_TABLET | Freq: Every morning | ORAL | Status: DC
  Administered 2013-05-25 – 2013-05-26 (×2): 40 mg via ORAL
  Filled 2013-05-25 (×2): qty 1

## 2013-05-25 MED ORDER — ALBUTEROL SULFATE (5 MG/ML) 0.5% IN NEBU: 2.5000 mg | INHALATION_SOLUTION | Freq: Four times a day (QID) | RESPIRATORY_TRACT | Status: DC | PRN

## 2013-05-25 MED ORDER — ASPIRIN 325 MG PO TABS
325.0000 mg | ORAL_TABLET | Freq: Every day | ORAL | Status: DC
  Administered 2013-05-25 – 2013-05-26 (×2): 325 mg via ORAL
  Filled 2013-05-25 (×2): qty 1

## 2013-05-25 MED ORDER — SENNOSIDES-DOCUSATE SODIUM 8.6-50 MG PO TABS: 1.0000 | ORAL_TABLET | Freq: Every evening | ORAL | Status: DC | PRN

## 2013-05-25 MED ORDER — LOSARTAN POTASSIUM 25 MG PO TABS
25.0000 mg | ORAL_TABLET | Freq: Every day | ORAL | Status: DC
  Administered 2013-05-25 – 2013-05-26 (×2): 25 mg via ORAL
  Filled 2013-05-25 (×2): qty 1

## 2013-05-25 MED ORDER — DULOXETINE HCL 30 MG PO CPEP
30.0000 mg | ORAL_CAPSULE | Freq: Every morning | ORAL | Status: DC
  Administered 2013-05-25 – 2013-05-26 (×2): 30 mg via ORAL
  Filled 2013-05-25 (×2): qty 1

## 2013-05-25 MED ORDER — POTASSIUM CHLORIDE CRYS ER 20 MEQ PO TBCR
40.0000 meq | EXTENDED_RELEASE_TABLET | Freq: Once | ORAL | Status: AC
  Administered 2013-05-25: 40 meq via ORAL
  Filled 2013-05-25: qty 2

## 2013-05-25 MED ORDER — ENOXAPARIN SODIUM 30 MG/0.3ML ~~LOC~~ SOLN
30.0000 mg | SUBCUTANEOUS | Status: DC
  Administered 2013-05-25: 30 mg via SUBCUTANEOUS
  Filled 2013-05-25 (×2): qty 0.3

## 2013-05-25 MED ORDER — POTASSIUM CHLORIDE 10 MEQ/100ML IV SOLN
10.0000 meq | INTRAVENOUS | Status: DC
  Filled 2013-05-25 (×4): qty 100

## 2013-05-25 MED ORDER — LUBIPROSTONE 24 MCG PO CAPS
24.0000 ug | ORAL_CAPSULE | Freq: Every day | ORAL | Status: DC
  Administered 2013-05-25: 24 ug via ORAL
  Filled 2013-05-25 (×3): qty 1

## 2013-05-25 MED ORDER — LORATADINE 10 MG PO TABS
10.0000 mg | ORAL_TABLET | Freq: Every morning | ORAL | Status: DC
  Administered 2013-05-25 – 2013-05-26 (×2): 10 mg via ORAL
  Filled 2013-05-25 (×2): qty 1

## 2013-05-25 MED ORDER — FUROSEMIDE 20 MG PO TABS
20.0000 mg | ORAL_TABLET | Freq: Every day | ORAL | Status: DC
  Filled 2013-05-25: qty 1

## 2013-05-25 MED ORDER — TIMOLOL MALEATE 0.5 % OP SOLN
1.0000 [drp] | Freq: Two times a day (BID) | OPHTHALMIC | Status: DC
  Administered 2013-05-25 – 2013-05-26 (×3): 1 [drp] via OPHTHALMIC
  Filled 2013-05-25: qty 5

## 2013-05-25 MED ORDER — INSULIN ASPART 100 UNIT/ML ~~LOC~~ SOLN
0.0000 [IU] | Freq: Three times a day (TID) | SUBCUTANEOUS | Status: DC
  Administered 2013-05-25: 1 [IU] via SUBCUTANEOUS

## 2013-05-25 MED ORDER — POTASSIUM CHLORIDE ER 10 MEQ PO TBCR
10.0000 meq | EXTENDED_RELEASE_TABLET | Freq: Every morning | ORAL | Status: DC
  Administered 2013-05-26: 10 meq via ORAL
  Filled 2013-05-25 (×2): qty 1

## 2013-05-25 MED ORDER — INSULIN ASPART 100 UNIT/ML ~~LOC~~ SOLN: 0.0000 [IU] | SUBCUTANEOUS | Status: DC

## 2013-05-25 MED ORDER — ASPIRIN 81 MG PO CHEW
324.0000 mg | CHEWABLE_TABLET | Freq: Once | ORAL | Status: AC
  Administered 2013-05-25: 324 mg via ORAL
  Filled 2013-05-25: qty 4

## 2013-05-25 NOTE — H&P (Signed)
[facility] History and Physical  Savannah Lindsey JXB:[id number] DOB: 10/24/1945 DOA: 05/24/2013  Referring physician:  EDP PCP: Arlyss Queen  Specialists:   Chief Complaint:    Dizziness and Left Sided Weakness  HPI: Savannah Lindsey is a 67 y.o. female with a Multiple Medical Problems including a previous CVA, and DM2, and Hyperlipidema who presents to the ED with complaints of dizziness and unsteady gait for the past 3 days along with left sided weakness.  She denies having any headache or chest pain.  A Ct Scan of the Brain was performed in the ED and was found to be negative for acute findings.  She was referred for medical admission.     Review of Systems: The patient denies anorexia, fever, chills, headaches, weight loss,, vision loss, diplopia, dizziness, decreased hearing, rhinitis, hoarseness, chest pain, syncope, dyspnea on exertion, peripheral edema, balance deficits, cough, hemoptysis, abdominal pain, nausea, vomiting, diarrhea, constipation, hematemesis, melena, hematochezia, severe indigestion/heartburn, dysuria, hematuria, incontinence, muscle weakness, suspicious skin lesions, transient blindness, difficulty walking, depression, unusual weight change, abnormal bleeding, enlarged lymph nodes, angioedema, and breast masses.    Past Medical History  Diagnosis Date  . Diabetes mellitus   . Hypertension   . COPD (chronic obstructive pulmonary disease)   . GERD (gastroesophageal reflux disease)   . Hypercholesteremia   . Anxiety   . Stroke   . Headaches due to old head injury   . Blind one eye   . Angina   . Shortness of breath   . Pneumonia   . Recurrent upper respiratory infection (URI)   . Anemia   . Arthritis   . Complication of anesthesia     difficulty waking up  . Chronic kidney disease   . Cancer     skin cancer right thumb  . Temporal arteritis 11/17/2011  . Gastric polyp     antral  . Stenosis of esophagus     Past Surgical History  Procedure  Laterality Date  . Abdominal hysterectomy    . Cholecystectomy    . Heel spur surgery      bilateral  . Brain surgery    . Esophagogastroduodenoscopy  11/18/2011    Procedure: ESOPHAGOGASTRODUODENOSCOPY (EGD);  Surgeon: Iva Boop, MD;  Location: [facility] ENDOSCOPY;  Service: Endoscopy;  Laterality: N/A;    Prior to Admission medications   Medication Sig Start Date End Date Taking? Authorizing Provider  aspirin EC 81 MG tablet Take 81 mg by mouth daily.   Yes Historical Provider, MD  atorvastatin (LIPITOR) 40 MG tablet Take 40 mg by mouth every morning.    Yes Historical Provider, MD  DULoxetine (CYMBALTA) 30 MG capsule Take 30 mg by mouth every morning.   Yes Historical Provider, MD  furosemide (LASIX) 20 MG tablet Take 20 mg by mouth daily.   Yes Historical Provider, MD  insulin aspart (NOVOLOG) 100 UNIT/ML injection Inject 16 Units into the skin 4 (four) times daily -  before meals and at bedtime.   Yes Historical Provider, MD  insulin detemir (LEVEMIR) 100 UNIT/ML injection Inject 30 Units into the skin at bedtime.    Yes Historical Provider, MD  loratadine (CLARITIN) 10 MG tablet Take 10 mg by mouth every morning.    Yes Historical Provider, MD  losartan (COZAAR) 25 MG tablet Take 25 mg by mouth daily.   Yes Historical Provider, MD  lubiprostone (AMITIZA) 24 MCG capsule Take 24 mcg by mouth daily with breakfast.   Yes Historical Provider, MD  pantoprazole (PROTONIX) 40  MG tablet Take 40 mg by mouth every morning.   Yes Historical Provider, MD  potassium chloride (K-DUR) 10 MEQ tablet Take 10 mEq by mouth every morning.   Yes Historical Provider, MD  timolol (BETIMOL) 0.5 % ophthalmic solution Place 1 drop into both eyes 2 (two) times daily.   Yes Historical Provider, MD  albuterol (PROVENTIL) (2.5 MG/3ML) 0.083% nebulizer solution Take 2.5 mg by nebulization every 6 (six) hours as needed for wheezing. For wheezing    Historical Provider, MD    Allergies  Allergen Reactions  . Codeine  Itching and Nausea And Vomiting    Social History:  reports that she has never smoked. She has never used smokeless tobacco. She reports that she does not drink alcohol or use illicit drugs.     Family History  Problem Relation Age of Onset  . Colon cancer         Physical Exam:  GEN:  Pleasant Obese  67 y.o. Caucasian female  examined  and in no acute distress; cooperative with exam Filed Vitals:   05/24/13 2240 05/25/13 0012 05/25/13 0112 05/25/13 0300  BP: 143/72   128/78  Pulse: 74   76  Temp:  97.9 F (36.6 C) 97.9 F (36.6 C)   TempSrc:  Oral    Resp: 18   18  SpO2: 98%   98%   Blood pressure 128/78, pulse 76, temperature 97.9 F (36.6 C), temperature source Oral, resp. rate 18, SpO2 98.00%. PSYCH: She is alert and oriented x4; does not appear anxious does not appear depressed; affect is normal HEENT: Normocephalic and Atraumatic, Mucous membranes pink; PERRLA; EOM intact; Fundi:  Benign;  No scleral icterus, Nares: Patent, Oropharynx: Clear, Edentulous;  Neck:  FROM, no cervical lymphadenopathy nor thyromegaly or carotid bruit; no JVD; Breasts:: Not examined CHEST WALL: No tenderness CHEST: Normal respiration, clear to auscultation bilaterally HEART: Regular rate and rhythm; no murmurs rubs or gallops BACK: No kyphosis or scoliosis; no CVA tenderness ABDOMEN: Positive Bowel Sounds,  Obese, soft non-tender; no masses, no organomegaly. Rectal Exam: Not done EXTREMITIES: No cyanosis, clubbing or edema; no ulcerations. Genitalia: not examined PULSES: 2+ and symmetric SKIN: Normal hydration no rash or ulceration CNS: Cranial nerves 2-12 grossly intact  Except for poor vision of the Right Eye,  Motor and sensory function are intact,  Cerbellar fxn intact, Gait not assessed, DTR 2/4 and Strength of 5/5 Throughout.      Labs on Admission:  Basic Metabolic Panel:  Recent Labs Lab 05/25/13 0055  NA 137  K 2.8*  CL 102  CO2 23  GLUCOSE 169*  BUN 27*  CREATININE  2.92*  CALCIUM 8.5   Liver Function Tests:  Recent Labs Lab 05/25/13 0055  AST 8  ALT <5  ALKPHOS 142*  BILITOT 0.3  PROT 6.6  ALBUMIN 3.1*   No results found for this basename: LIPASE, AMYLASE,  in the last 168 hours No results found for this basename: AMMONIA,  in the last 168 hours CBC:  Recent Labs Lab 05/25/13 0055  WBC 12.6*  NEUTROABS 8.6*  HGB 10.7*  HCT 31.0*  MCV 90.1  PLT 272   Cardiac Enzymes: No results found for this basename: CKTOTAL, CKMB, CKMBINDEX, TROPONINI,  in the last 168 hours  BNP (last 3 results) No results found for this basename: PROBNP,  in the last 8760 hours CBG:  Recent Labs Lab 05/25/13 0503  GLUCAP 149*    Radiological Exams on Admission: Ct Head (brain) Wo Contrast  05/24/2013   CLINICAL DATA:  Dizziness and falls  EXAM: CT HEAD WITHOUT CONTRAST  TECHNIQUE: Contiguous axial images were obtained from the base of the skull through the vertex without intravenous contrast.  COMPARISON:  Prior MRI from 01/10/2013.  FINDINGS: Encephalomalacia within the left occipital lobe is unchanged, compatible with remote infarct. Hypodensity within the periventricular white matter is consistent with chronic microvascular ischemic disease, also unchanged.  There is no acute intracranial hemorrhage or infarct. No mass lesion or midline shift. Gray-white matter differentiation is well maintained. Ventricles are normal in size without evidence of hydrocephalus. CSF containing spaces are within normal limits. No extra-axial fluid collection.  The calvarium is intact.  Orbital soft tissues are within normal limits.  The paranasal sinuses and mastoid air cells are well pneumatized and free of fluid.  Scalp soft tissues are unremarkable.  IMPRESSION: 1. No acute intracranial process. 2. Stable appearance of remote left occipital infarct and chronic microvascular ischemic changes.   Electronically Signed   By: Rise Mu M.D.   On: 05/24/2013 23:37       Assessment/Plan Principal Problem:   Vertigo Active Problems:   Left-sided weakness   HTN (hypertension)   Type II or unspecified type diabetes mellitus without mention of complication, not stated as uncontrolled   Chronic kidney disease   Hypercholesteremia   COPD (chronic obstructive pulmonary disease)    Anemia ( Normocytic)    Hypokalemia     1.   Vertigo and Ataxia -  Rule out posterior Circulation infarction, MRI/MRA of Brain ordered, also Carotid US and 2D ECHO.  Neuro Checks, and ASA Rx.   2.   Left Sided Weakness- Same Imagine as in #1 for further evlauation.    3.   HTN- on Losartan and lasix Rx, both being held due to CKD with AKI.    4.   DM2-  Continue Levermir RX and SSI added PRN elevated Glucose levels, and check HbA1C.    5.  Hyperlipidemia-   Continue Atorvastatin Rx and check lipid pamel.     6.   COPD- stable - continue Albuterol Nebs PRN.      7.  Anemia-  Hx of Iron Deficiency , had been receiving Epogen injections,  Indices are Normocytic at this time.    8.  Hypokalemia-   Due to Lasix Rx,  Replete K+ and Check Magnesium.     9.  DVT Prophylaxis with Lovenox.      Code Status:   FULL CODE Family Communication:  No Family Present   Disposition Plan:   Observation    Time spent:    85 Minutes  Ron Parker [facility] Pager [phone]  If 7PM-7AM, please contact night-coverage [email or web address] Password [facility] 05/25/2013, 6:39 AM

## 2013-05-25 NOTE — Progress Notes (Signed)
VASCULAR LAB PRELIMINARY  PRELIMINARY  PRELIMINARY  PRELIMINARY  Carotid Dopplers completed.    Preliminary report:  There is  1-39% ICA stenosis.  Vertebral artery flow is antegrade.  Sammi Stolarz, RVT 05/25/2013, 4:25 PM

## 2013-05-25 NOTE — Progress Notes (Signed)
Patient seen and examined today. Agree with current plan. CT scan of the brain was found to be negative for acute changes. They're currently pending workup for a CVA. Patient's dizziness as well as ataxia somewhat subsided subsided. Will have physical therapy work with the patient.  1. Vertigo and Ataxia - Rule out posterior Circulation infarction, MRI/MRA of Brain ordered, also Carotid US and 2D ECHO. Neuro Checks, and ASA Rx.  2. Left Sided Weakness- Same Imagine as in #1 for further evlauation.  3. HTN- on Losartan and lasix Rx, both being held due to CKD with AKI.  4. DM2- Continue Levermir RX and SSI added PRN elevated Glucose levels, and check HbA1C.  5. Hyperlipidemia- Continue Atorvastatin Rx and check lipid pamel.  6. COPD- stable - continue Albuterol Nebs PRN.  7. Anemia- Hx of Iron Deficiency , had been receiving Epogen injections, Indices are Normocytic at this time.  8. Hypokalemia- Due to Lasix Rx, Replete K+ and Check Magnesium.  9. DVT Prophylaxis with Lovenox.

## 2013-05-26 DIAGNOSIS — I519 Heart disease, unspecified: Secondary | ICD-10-CM

## 2013-05-26 LAB — BASIC METABOLIC PANEL
BUN: 30 mg/dL — ABNORMAL HIGH (ref 6–23)
CO2: 21 mEq/L (ref 19–32)
Chloride: 109 mEq/L (ref 96–112)
GFR calc Af Amer: 18 mL/min — ABNORMAL LOW (ref >90)
Potassium: 4.4 mEq/L (ref 3.5–5.1)
Sodium: 140 mEq/L (ref 135–145)

## 2013-05-26 LAB — URINE CULTURE: Colony Count: 25000

## 2013-05-26 LAB — CBC
HCT: 32.2 % — ABNORMAL LOW (ref 36.0–46.0)
MCHC: 32.6 g/dL (ref 30.0–36.0)
Platelets: 274 10*3/uL (ref 150–400)
RBC: 3.45 MIL/uL — ABNORMAL LOW (ref 3.87–5.11)
RDW: 13.9 % (ref 11.5–15.5)
WBC: 12.3 10*3/uL — ABNORMAL HIGH (ref 4.0–10.5)

## 2013-05-26 LAB — GLUCOSE, CAPILLARY
Glucose-Capillary: 110 mg/dL — ABNORMAL HIGH (ref 70–99)
Glucose-Capillary: 101 mg/dL — ABNORMAL HIGH (ref 70–99)

## 2013-05-26 NOTE — Progress Notes (Signed)
  Echocardiogram 2D Echocardiogram has been performed.  Savannah Lindsey Savannah Lindsey 05/26/2013, 11:06 AM

## 2013-05-26 NOTE — Evaluation (Signed)
SLP Cancellation Note  Patient Details Name: Savannah Lindsey MRN: [id number] DOB: December 27, 1945   Cancelled treatment:       Reason Eval/Treat Not Completed: Patient at procedure or test/unavailable   Pt having echo, RN reports pt with intact speech and language.  Will re-attempt eval at another time. If pt to dc and needs SLP, outpt evaluation may be ordered.    Donavan Burnet, MS [facility] SLP [phone]

## 2013-05-26 NOTE — Discharge Summary (Signed)
Physician Discharge Summary  Savannah Lindsey ZOX:[id number] DOB: [birth year] DOA: 05/24/2013  PCP: Savannah Lindsey  Admit date: 05/24/2013 Discharge date: 05/26/2013  Time spent: 45 minutes  Recommendations for Outpatient Follow-up:  Patient with up with her primary care physician within one week of discharge. She should also continue her medications as prescribed. Patient should also seek physical therapy as an outpatient. Echocardiogram is still pending. Those results should also be followed up on.  Discharge Diagnoses:  Principal Problem:   Vertigo Active Problems:   HTN (hypertension)   Type II or unspecified type diabetes mellitus without mention of complication, not stated as uncontrolled   Left-sided weakness   Chronic kidney disease   Hypercholesteremia   COPD (chronic obstructive pulmonary disease)   Hypokalemia   Discharge Condition: Stable  Diet recommendation: Carb modified heart healthy  Filed Weights   05/25/13 1500  Weight: 83.915 kg (185 lb)    History of present illness:  Savannah Lindsey is a 67 y.o. female with a Multiple Medical Problems including a previous CVA, and DM2, and Hyperlipidema who presents to the ED with complaints of dizziness and unsteady gait for the past 3 days along with left sided weakness. She denies having any headache or chest pain. A Ct Scan of the Brain was performed in the ED and was found to be negative for acute findings. She was referred for medical admission.   Hospital Course:  This is a 67 year old with previous CVA, hypertension, hyperlipidemia, diabetes mellitus type 2 that presented to the emergency room and unsteady gait as well as dizziness for approximately 3 days before coming in. Patient also had some left-sided weakness. Upon admission patient did have CT scan of the brain which was negative for acute findings. Patient was admitted physical therapy was also consulted, and recommended outpatient follow physical therapy.   MRI was also conducted showing no evidence of acute ischemia. MRA of the head also showed no large vessel occlusion or hemodynamically significant stenosis. Carotid Dopplers also conducted showing a 1-39% ICA stenosis vertebral artery flow antegrade.  2-D echocardiogram was also conducted, results are still pending. Patient was also noted to be hypokalemic upon admission. This was repleted, her potassium on day of discharge is 4.4.  Patient does have hypertension however her medications were held due to to acute kidney injury. Her creatinine was noted to be 2.9. This actually may be a new baseline for the patient. Her baseline has been approximately 2.3-2.5. She was continued on atorvastatin for hyperlipidemia. Patient was continued on Levemir, her with insulin sliding scale for her diabetes mellitus type 2. She has a history of iron deficiency anemia which remained stable at this time. Patient will need follow up with her primary care physician within one week of discharge. She should continue taking her medications as prescribed. This was discussed with the patient she does understand agree. Patient should not drive until seen by her primary care physician. Patient should not drive until seen by her primary care physician.  Procedures: Carotid Dopplers also conducted showing a 1-39% ICA stenosis vertebral artery flow antegrade.   Echocardiogram Pending read  Consultations: None  Discharge Exam: Filed Vitals:   05/26/13 0617  BP: 126/73  Pulse: 78  Temp: 98.1 F (36.7 C)  Resp: 18     General: Well developed, well nourished, NAD, appears stated age  HEENT: NCAT, PERRLA, EOMI, Anicteic Sclera, mucous membranes moist. No pharyngeal erythema or exudates.  Scab noted on forehead.  Neck: Supple, no JVD, no masses  Cardiovascular: S1 S2 auscultated, no rubs, murmurs or gallops. Regular rate and rhythm.  Respiratory: Clear to auscultation bilaterally with equal chest rise  Abdomen: Soft,  nontender, nondistended, + bowel sounds  Extremities: warm dry without cyanosis clubbing or edema  Neuro: AAOx3, cranial nerves grossly intact. Strength 5/5 in patient's upper and lower extremities bilaterally. Gait noted to be normal.  Skin: Without rashes exudates or nodules  Psych: Normal affect and demeanor with intact judgement and insight  Discharge Instructions   Future Appointments Provider Department Dept Phone   06/13/2013 9:00 AM [facility] Injection [facility] [phone]       Medication List         albuterol (2.5 MG/3ML) 0.083% nebulizer solution  Commonly known as:  PROVENTIL  Take 2.5 mg by nebulization every 6 (six) hours as needed for wheezing. For wheezing     aspirin EC 81 MG tablet  Take 81 mg by mouth daily.     atorvastatin 40 MG tablet  Commonly known as:  LIPITOR  Take 40 mg by mouth every morning.     DULoxetine 30 MG capsule  Commonly known as:  CYMBALTA  Take 30 mg by mouth every morning.     furosemide 20 MG tablet  Commonly known as:  LASIX  Take 20 mg by mouth daily.     insulin aspart 100 UNIT/ML injection  Commonly known as:  novoLOG  Inject 16 Units into the skin 4 (four) times daily -  before meals and at bedtime.     insulin detemir 100 UNIT/ML injection  Commonly known as:  LEVEMIR  Inject 30 Units into the skin at bedtime.     loratadine 10 MG tablet  Commonly known as:  CLARITIN  Take 10 mg by mouth every morning.     losartan 25 MG tablet  Commonly known as:  COZAAR  Take 25 mg by mouth daily.     lubiprostone 24 MCG capsule  Commonly known as:  AMITIZA  Take 24 mcg by mouth daily with breakfast.     pantoprazole 40 MG tablet  Commonly known as:  PROTONIX  Take 40 mg by mouth every morning.     potassium chloride 10 MEQ tablet  Commonly known as:  K-DUR  Take 10 mEq by mouth every morning.     timolol 0.5 % ophthalmic solution  Commonly known as:  BETIMOL  Place 1 drop  into both eyes 2 (two) times daily.       Allergies  Allergen Reactions  . Codeine Itching and Nausea And Vomiting       Follow-up Information   Follow up with CONROY,NATHAN, PA-C. Schedule an appointment as soon as possible for a visit in 1 week.   Specialty:  Physician Assistant   Contact information:   [facility] [address] [place] [address] [phone]        The results of significant diagnostics from this hospitalization (including imaging, microbiology, ancillary and laboratory) are listed below for reference.    Significant Diagnostic Studies: Dg Chest 2 View  05/25/2013   CLINICAL DATA:  Stroke.  COPD.  Diabetes and hypertension  EXAM: CHEST  2 VIEW  COMPARISON:  01/09/2013  FINDINGS: The heart size and mediastinal contours are within normal limits. Both lungs are clear. The visualized skeletal structures are unremarkable.  IMPRESSION: No active cardiopulmonary disease.   Electronically Signed   By: Myles Rosenthal M.D.   On: 05/25/2013 19:42  Ct Head (brain) Wo Contrast  05/24/2013   CLINICAL DATA:  Dizziness and falls  EXAM: CT HEAD WITHOUT CONTRAST  TECHNIQUE: Contiguous axial images were obtained from the base of the skull through the vertex without intravenous contrast.  COMPARISON:  Prior MRI from 01/10/2013.  FINDINGS: Encephalomalacia within the left occipital lobe is unchanged, compatible with remote infarct. Hypodensity within the periventricular white matter is consistent with chronic microvascular ischemic disease, also unchanged.  There is no acute intracranial hemorrhage or infarct. No mass lesion or midline shift. Gray-white matter differentiation is well maintained. Ventricles are normal in size without evidence of hydrocephalus. CSF containing spaces are within normal limits. No extra-axial fluid collection.  The calvarium is intact.  Orbital soft tissues are within normal limits.  The paranasal sinuses and mastoid air cells are well  pneumatized and free of fluid.  Scalp soft tissues are unremarkable.  IMPRESSION: 1. No acute intracranial process. 2. Stable appearance of remote left occipital infarct and chronic microvascular ischemic changes.   Electronically Signed   By: Rise Mu M.D.   On: 05/24/2013 23:37   Mr Brain Wo Contrast  05/26/2013   CLINICAL DATA:  Dizziness and left-sided weakness. History of stroke and diabetes.  EXAM: MRI HEAD WITHOUT CONTRAST  MRA HEAD WITHOUT CONTRAST  TECHNIQUE: Multiplanar, multiecho pulse sequences of the brain and surrounding structures were obtained without intravenous contrast. Angiographic images of the head were obtained using MRA technique without contrast.  COMPARISON:  CT of the head May 24, 2013. MRI of the brain May 13, 2013  FINDINGS: MRI HEAD FINDINGS  No reduced diffusion to suggest acute ischemia. Similar micro hemorrhage in the right occipital lobe, multiple micro hemorrhages in the right basal ganglia, micro hemorrhage in the right frontal lobe, all of which are relatively stable from prior examination.  Remote bilateral basal ganglia cystic lacunar infarcts, with subcentimeter remote right thalamic lacunar infarct. Left occipital lobe cortical to subcortical encephalomalacia. Mild ex vacuo dilatation of left occipital horn, no hydrocephalus. No midline shift or mass effect.  No abnormal extra-axial fluid collections. Dual echo ectatic appearance of the intracranial vessels suggesting chronic hypertension.  Status post bilateral ocular lens implants. Mild atretic right maxillary sinus suggest chronic sinusitis without acute component. No paranasal sinus air-fluid levels. Patient appears edentulous. Mild temporomandibular osteoarthrosis. Craniocervical junction maintained. No abnormal sellar expansion.  MRA HEAD FINDINGS  Anterior circulation: Bilateral included cervical, the petrous, cavernous and supra clinoid internal carotid artery flow voids are patent. Left A1  segment is dominant, with patent right A1 segment, patent anterior communicating artery and normal flow related enhancement the anterior and middle cerebral arteries as well as more distal segments.  Posterior circulation: Codominant vertebral arteries with normal flow early enhancement of the basilar artery main branch vessels. Robust right communicating artery with compensatory diminutive right P1 segment, normal enhancement of the posterior cerebral arteries.  No large vessel occlusion within the anterior nor posterior circulation, no hemodynamically significant stenosis or aneurysm. Moderate intracranial vessel irregularity suggests atherosclerosis most apparent in the right M1 segment as previously described.  IMPRESSION: MRI of the head:  No evidence of acute ischemia.  Remote bilateral basal ganglia and left posterior cerebral artery territory infarcts. Scattered micro hemorrhages suggest sequela of chronic hypertension, as corroborated by dolicoectatic appearance of the intracranial vessels.  MRA of the head: No large vessel occlusion or hemodynamically significant stenosis. Moderate intracranial atherosclerosis.   Electronically Signed   By: Awilda Metro   On: 05/26/2013 01:59  Mr Maxine Glenn Head/brain Wo Cm  05/26/2013   CLINICAL DATA:  Dizziness and left-sided weakness. History of stroke and diabetes.  EXAM: MRI HEAD WITHOUT CONTRAST  MRA HEAD WITHOUT CONTRAST  TECHNIQUE: Multiplanar, multiecho pulse sequences of the brain and surrounding structures were obtained without intravenous contrast. Angiographic images of the head were obtained using MRA technique without contrast.  COMPARISON:  CT of the head May 24, 2013. MRI of the brain May 13, 2013  FINDINGS: MRI HEAD FINDINGS  No reduced diffusion to suggest acute ischemia. Similar micro hemorrhage in the right occipital lobe, multiple micro hemorrhages in the right basal ganglia, micro hemorrhage in the right frontal lobe, all of which are  relatively stable from prior examination.  Remote bilateral basal ganglia cystic lacunar infarcts, with subcentimeter remote right thalamic lacunar infarct. Left occipital lobe cortical to subcortical encephalomalacia. Mild ex vacuo dilatation of left occipital horn, no hydrocephalus. No midline shift or mass effect.  No abnormal extra-axial fluid collections. Dual echo ectatic appearance of the intracranial vessels suggesting chronic hypertension.  Status post bilateral ocular lens implants. Mild atretic right maxillary sinus suggest chronic sinusitis without acute component. No paranasal sinus air-fluid levels. Patient appears edentulous. Mild temporomandibular osteoarthrosis. Craniocervical junction maintained. No abnormal sellar expansion.  MRA HEAD FINDINGS  Anterior circulation: Bilateral included cervical, the petrous, cavernous and supra clinoid internal carotid artery flow voids are patent. Left A1 segment is dominant, with patent right A1 segment, patent anterior communicating artery and normal flow related enhancement the anterior and middle cerebral arteries as well as more distal segments.  Posterior circulation: Codominant vertebral arteries with normal flow early enhancement of the basilar artery main branch vessels. Robust right communicating artery with compensatory diminutive right P1 segment, normal enhancement of the posterior cerebral arteries.  No large vessel occlusion within the anterior nor posterior circulation, no hemodynamically significant stenosis or aneurysm. Moderate intracranial vessel irregularity suggests atherosclerosis most apparent in the right M1 segment as previously described.  IMPRESSION: MRI of the head:  No evidence of acute ischemia.  Remote bilateral basal ganglia and left posterior cerebral artery territory infarcts. Scattered micro hemorrhages suggest sequela of chronic hypertension, as corroborated by dolicoectatic appearance of the intracranial vessels.  MRA of the  head: No large vessel occlusion or hemodynamically significant stenosis. Moderate intracranial atherosclerosis.   Electronically Signed   By: Awilda Metro   On: 05/26/2013 01:59    Microbiology: No results found for this or any previous visit (from the past 240 hour(s)).   Labs: Basic Metabolic Panel:  Recent Labs Lab 05/25/13 0055  NA 137  K 2.8*  CL 102  CO2 23  GLUCOSE 169*  BUN 27*  CREATININE 2.92*  CALCIUM 8.5  MG 2.2   Liver Function Tests:  Recent Labs Lab 05/25/13 0055  AST 8  ALT <5  ALKPHOS 142*  BILITOT 0.3  PROT 6.6  ALBUMIN 3.1*   No results found for this basename: LIPASE, AMYLASE,  in the last 168 hours No results found for this basename: AMMONIA,  in the last 168 hours CBC:  Recent Labs Lab 05/25/13 0055  WBC 12.6*  NEUTROABS 8.6*  HGB 10.7*  HCT 31.0*  MCV 90.1  PLT 272   Cardiac Enzymes: No results found for this basename: CKTOTAL, CKMB, CKMBINDEX, TROPONINI,  in the last 168 hours BNP: BNP (last 3 results) No results found for this basename: PROBNP,  in the last 8760 hours CBG:  Recent Labs Lab 05/25/13 1159 05/25/13 1639 05/25/13 2127 05/26/13  0726 05/26/13 1127  GLUCAP 129* 134* 176* 110* 101*       Signed:  Edsel Petrin  [facility] 05/26/2013, 12:31 PM

## 2013-05-26 NOTE — Evaluation (Signed)
Physical Therapy Evaluation Patient Details Name: Savannah Lindsey MRN: [id number] DOB: May 17, 1946 Today's Date: 05/26/2013 Time: 4098-1191 PT Time Calculation (min): 16 min  PT Assessment / Plan / Recommendation History of Present Illness  pt presents with episode of dizziness and hx of old CVA.  MRI showing remote Bil Basal Ganglia and L Posterior Cerebral Territory Infarcts.    Clinical Impression  Pt appears to be at her baseline and pt indicates she feels back to normal.  Pt does still have mild balance deficits and would benefit from f/u with OPPT.  No further acute care needs.  Will sign off.      PT Assessment  All further PT needs can be met in the next venue of care    Follow Up Recommendations  Outpatient PT;Supervision - Intermittent    Does the patient have the potential to tolerate intense rehabilitation      Barriers to Discharge        Equipment Recommendations  None recommended by PT    Recommendations for Other Services     Frequency      Precautions / Restrictions Precautions Precautions: Fall Restrictions Weight Bearing Restrictions: No   Pertinent Vitals/Pain L hip "always sore" from bursitis per pt.        Mobility  Bed Mobility Bed Mobility: Supine to Sit;Sitting - Scoot to Edge of Bed Supine to Sit: 6: Modified independent (Device/Increase time);With rails Sitting - Scoot to Edge of Bed: 6: Modified independent (Device/Increase time) Details for Bed Mobility Assistance: pt moves slowly and used rial, but performed without difficulty.   Transfers Transfers: Sit to Stand;Stand to Sit Sit to Stand: 5: Supervision;With upper extremity assist;From bed Stand to Sit: 5: Supervision;With upper extremity assist;To chair/3-in-1 Details for Transfer Assistance: Needs use of UEs to complete.   Ambulation/Gait Ambulation/Gait Assistance: 4: Min guard Ambulation Distance (Feet): 300 Feet Assistive device: None Ambulation/Gait Assistance Details: pt  antalgic on L LE and mildly unsteady.  pt indicates this is baseline.   Gait Pattern: Step-through pattern;Decreased stride length;Antalgic Stairs: Yes Stairs Assistance: 4: Min guard Stair Management Technique: One rail Right;Forwards Number of Stairs: 2 Naval architect Mobility: No Modified Rankin (Stroke Patients Only) Pre-Morbid Rankin Score: Slight disability Modified Rankin: Moderate disability    Exercises     PT Diagnosis: Difficulty walking  PT Problem List: Decreased strength;Decreased balance;Decreased mobility PT Treatment Interventions:       PT Goals(Current goals can be found in the care plan section)    Visit Information  Last PT Received On: 05/26/13 Assistance Needed: +1 History of Present Illness: pt presents with episode of dizziness and hx of old CVA.  MRI showing remote Bil Basal Ganglia and L Posterior Cerebral Territory Infarcts.         Prior Functioning  Home Living Family/patient expects to be discharged to:: Private residence Living Arrangements: Children Available Help at Discharge: Family;Available 24 hours/day Type of Home: Mobile home Home Access: Stairs to enter Entrance Stairs-Number of Steps: 6 Entrance Stairs-Rails: Right;Left;Can reach both Home Layout: One level Home Equipment: Walker - 2 wheels;Bedside commode;Cane - single point Prior Function Level of Independence: Independent Comments: pt with hx of falls and states family tries to do more for her, but she doesn't want them to.   Communication Communication: No difficulties Dominant Hand: Right    Cognition  Cognition Arousal/Alertness: Awake/alert Behavior During Therapy: WFL for tasks assessed/performed Overall Cognitive Status: Within Functional Limits for tasks assessed    Extremity/Trunk Assessment Upper Extremity Assessment  Upper Extremity Assessment: Defer to OT evaluation Lower Extremity Assessment Lower Extremity Assessment: Generalized  weakness;LLE deficits/detail LLE Deficits / Details: pt indicates hx of L hip bursitis and demos decreased strength 2/2 pain in hip.     Balance Balance Balance Assessed: No  End of Session PT - End of Session Equipment Utilized During Treatment: Gait belt Activity Tolerance: Patient tolerated treatment well Patient left: in chair;with call bell/phone within reach Nurse Communication: Mobility status  GP Functional Assessment Tool Used: Clinical Judgement Functional Limitation: Mobility: Walking and moving around Mobility: Walking and Moving Around Current Status ([id number]): At least 1 percent but less than 20 percent impaired, limited or restricted Mobility: Walking and Moving Around Goal Status [phone]): At least 1 percent but less than 20 percent impaired, limited or restricted Mobility: Walking and Moving Around Discharge Status [phone]): At least 1 percent but less than 20 percent impaired, limited or restricted   Sunny Schlein, [place] [phone] 05/26/2013, 9:17 AM

## 2013-05-26 NOTE — Progress Notes (Signed)
OT Discharge Note  Patient Details Name: Savannah Lindsey MRN: [id number] DOB: [birth year]   Cancelled Treatment:    Reason Eval/Treat Not Completed: OT screened, no needs identified, will sign off. Pt with baseline visual deficits, spoke with PT Aundra Millet and MD. NO acute needs at this time.   Harolyn Rutherford Pager: [phone]  05/26/2013, 8:53 AM

## 2013-06-13 ENCOUNTER — Encounter (HOSPITAL_COMMUNITY)
Admission: RE | Admit: 2013-06-13 | Discharge: 2013-06-13 | Disposition: A | Discharge status: Home / Self Care | Payer: Medicare Other | Source: Ambulatory Visit | Attending: Nephrology | Admitting: Nephrology

## 2013-06-13 DIAGNOSIS — I120 Hypertensive chronic kidney disease with stage 5 chronic kidney disease or end stage renal disease: Secondary | ICD-10-CM | POA: Insufficient documentation

## 2013-06-13 DIAGNOSIS — N186 End stage renal disease: Secondary | ICD-10-CM | POA: Insufficient documentation

## 2013-06-13 DIAGNOSIS — D631 Anemia in chronic kidney disease: Secondary | ICD-10-CM | POA: Insufficient documentation

## 2013-06-13 LAB — IRON AND TIBC
Iron: 71 ug/dL (ref 42–135)
TIBC: 239 ug/dL — ABNORMAL LOW (ref 250–470)
UIBC: 168 ug/dL (ref 125–400)

## 2013-06-13 LAB — FERRITIN: Ferritin: 296 ng/mL — ABNORMAL HIGH (ref 10–291)

## 2013-06-13 MED ORDER — EPOETIN ALFA 20000 UNIT/ML IJ SOLN
20000.0000 [IU] | INTRAMUSCULAR | Status: DC
  Administered 2013-06-13: 10:00:00 20000 [IU] via SUBCUTANEOUS

## 2013-06-13 MED ORDER — EPOETIN ALFA 20000 UNIT/ML IJ SOLN
INTRAMUSCULAR | Status: AC
  Filled 2013-06-13: qty 1

## 2013-07-11 ENCOUNTER — Encounter (HOSPITAL_COMMUNITY)
Admission: RE | Admit: 2013-07-11 | Discharge: 2013-07-11 | Disposition: A | Discharge status: Home / Self Care | Payer: Medicare Other | Source: Ambulatory Visit | Attending: Nephrology | Admitting: Nephrology

## 2013-07-11 DIAGNOSIS — N186 End stage renal disease: Secondary | ICD-10-CM | POA: Insufficient documentation

## 2013-07-11 DIAGNOSIS — D631 Anemia in chronic kidney disease: Secondary | ICD-10-CM | POA: Insufficient documentation

## 2013-07-11 DIAGNOSIS — N039 Chronic nephritic syndrome with unspecified morphologic changes: Principal | ICD-10-CM

## 2013-07-11 DIAGNOSIS — I120 Hypertensive chronic kidney disease with stage 5 chronic kidney disease or end stage renal disease: Secondary | ICD-10-CM | POA: Insufficient documentation

## 2013-07-11 LAB — IRON AND TIBC
IRON: 55 ug/dL (ref 42–135)
SATURATION RATIOS: 24 % (ref 20–55)
TIBC: 228 ug/dL — ABNORMAL LOW (ref 250–470)
UIBC: 173 ug/dL (ref 125–400)

## 2013-07-11 LAB — FERRITIN: Ferritin: 263 ng/mL (ref 10–291)

## 2013-07-11 MED ORDER — EPOETIN ALFA 20000 UNIT/ML IJ SOLN: 20000.0000 [IU] | INTRAMUSCULAR | Status: DC

## 2013-07-11 MED ORDER — EPOETIN ALFA 20000 UNIT/ML IJ SOLN
INTRAMUSCULAR | Status: AC
  Administered 2013-07-11: 09:00:00 20000 [IU] via SUBCUTANEOUS
  Filled 2013-07-11: qty 1

## 2013-07-14 LAB — POCT HEMOGLOBIN-HEMACUE: Hemoglobin: 11.4 g/dL — ABNORMAL LOW (ref 12.0–15.0)

## 2013-07-25 ENCOUNTER — Other Ambulatory Visit: Payer: Self-pay | Admitting: *Deleted

## 2013-07-25 ENCOUNTER — Encounter: Payer: Self-pay | Admitting: Surgery

## 2013-07-25 DIAGNOSIS — N184 Chronic kidney disease, stage 4 (severe): Secondary | ICD-10-CM

## 2013-07-25 DIAGNOSIS — Z01810 Encounter for preprocedural cardiovascular examination: Secondary | ICD-10-CM

## 2013-08-07 ENCOUNTER — Other Ambulatory Visit (HOSPITAL_COMMUNITY): Payer: Self-pay | Admitting: *Deleted

## 2013-08-08 ENCOUNTER — Encounter (HOSPITAL_COMMUNITY)
Admission: RE | Admit: 2013-08-08 | Discharge: 2013-08-08 | Disposition: A | Discharge status: Home / Self Care | Payer: Medicare Other | Source: Ambulatory Visit | Attending: Nephrology | Admitting: Nephrology

## 2013-08-08 ENCOUNTER — Encounter: Payer: Self-pay | Admitting: Surgery

## 2013-08-08 DIAGNOSIS — N186 End stage renal disease: Secondary | ICD-10-CM | POA: Insufficient documentation

## 2013-08-08 DIAGNOSIS — N039 Chronic nephritic syndrome with unspecified morphologic changes: Principal | ICD-10-CM

## 2013-08-08 DIAGNOSIS — I120 Hypertensive chronic kidney disease with stage 5 chronic kidney disease or end stage renal disease: Secondary | ICD-10-CM | POA: Insufficient documentation

## 2013-08-08 DIAGNOSIS — D631 Anemia in chronic kidney disease: Secondary | ICD-10-CM | POA: Insufficient documentation

## 2013-08-08 LAB — IRON AND TIBC
Iron: 61 ug/dL (ref 42–135)
Saturation Ratios: 25 % (ref 20–55)
TIBC: 244 ug/dL — AB (ref 250–470)
UIBC: 183 ug/dL (ref 125–400)

## 2013-08-08 LAB — POCT HEMOGLOBIN-HEMACUE: Hemoglobin: 11.2 g/dL — ABNORMAL LOW (ref 12.0–15.0)

## 2013-08-08 MED ORDER — SODIUM CHLORIDE 0.9 % IV SOLN
INTRAVENOUS | Status: DC
  Administered 2013-08-08: 10:00:00 via INTRAVENOUS

## 2013-08-08 MED ORDER — EPOETIN ALFA 20000 UNIT/ML IJ SOLN
20000.0000 [IU] | INTRAMUSCULAR | Status: DC
  Administered 2013-08-08: 20000 [IU] via SUBCUTANEOUS

## 2013-08-08 MED ORDER — FERUMOXYTOL INJECTION 510 MG/17 ML
510.0000 mg | Freq: Once | INTRAVENOUS | Status: AC
  Administered 2013-08-08: 510 mg via INTRAVENOUS

## 2013-08-08 MED ORDER — EPOETIN ALFA 20000 UNIT/ML IJ SOLN
INTRAMUSCULAR | Status: AC
  Filled 2013-08-08: qty 1

## 2013-08-08 MED ORDER — FERUMOXYTOL INJECTION 510 MG/17 ML
INTRAVENOUS | Status: AC
  Administered 2013-08-08: 10:00:00 510 mg via INTRAVENOUS
  Filled 2013-08-08: qty 17

## 2013-08-11 ENCOUNTER — Ambulatory Visit (INDEPENDENT_AMBULATORY_CARE_PROVIDER_SITE_OTHER)
Admission: RE | Admit: 2013-08-11 | Discharge: 2013-08-11 | Disposition: A | Discharge status: Home / Self Care | Payer: Medicare Other | Source: Ambulatory Visit | Attending: Surgery | Admitting: Surgery

## 2013-08-11 ENCOUNTER — Ambulatory Visit (HOSPITAL_COMMUNITY)
Admission: RE | Admit: 2013-08-11 | Discharge: 2013-08-11 | Disposition: A | Discharge status: Home / Self Care | Payer: Medicare Other | Source: Ambulatory Visit | Attending: Surgery | Admitting: Surgery

## 2013-08-11 ENCOUNTER — Encounter: Payer: Self-pay | Admitting: Surgery

## 2013-08-11 ENCOUNTER — Ambulatory Visit (INDEPENDENT_AMBULATORY_CARE_PROVIDER_SITE_OTHER): Payer: Medicare Other | Admitting: Surgery

## 2013-08-11 VITALS — BP 152/84 | HR 77 | Ht 60.0 in | Wt 185.9 lb

## 2013-08-11 DIAGNOSIS — Z01810 Encounter for preprocedural cardiovascular examination: Secondary | ICD-10-CM

## 2013-08-11 DIAGNOSIS — N184 Chronic kidney disease, stage 4 (severe): Secondary | ICD-10-CM | POA: Insufficient documentation

## 2013-08-11 DIAGNOSIS — N186 End stage renal disease: Secondary | ICD-10-CM

## 2013-08-11 NOTE — Progress Notes (Signed)
Patient name: Savannah Lindsey MRN: [id number] DOB: [birth year] Sex: female   Referred by: Dr. Detterding  Reason for referral:  Chief Complaint  Patient presents with  . New Evaluation    access placement     HISTORY OF PRESENT ILLNESS: This is a 68 year old female who is referred for evaluation for dialysis access.  The patient is right-handed.  Her renal failure is secondary to hypertension and diabetes.  Complicating features includes secondary hyperparathyroidism.  She is on an ARB for hypertension.  Her hypercholesterolemia is managed with a statin.  She is on insulin for her diabetes.  Her biggest limitation is her COPD.  She is a nonsmoker.  Past Medical History  Diagnosis Date  . Diabetes mellitus   . Hypertension   . COPD (chronic obstructive pulmonary disease)   . GERD (gastroesophageal reflux disease)   . Hypercholesteremia   . Anxiety   . Stroke   . Headaches due to old head injury   . Blind one eye   . Angina   . Shortness of breath   . Pneumonia   . Recurrent upper respiratory infection (URI)   . Anemia   . Arthritis   . Complication of anesthesia     difficulty waking up  . Chronic kidney disease   . Cancer     skin cancer right thumb  . Temporal arteritis 11/17/2011  . Gastric polyp     antral  . Stenosis of esophagus     Past Surgical History  Procedure Laterality Date  . Abdominal hysterectomy    . Cholecystectomy    . Heel spur surgery      bilateral  . Brain surgery    . Esophagogastroduodenoscopy  11/18/2011    Procedure: ESOPHAGOGASTRODUODENOSCOPY (EGD);  Surgeon: Gatha Mayer, MD;  Location: [facility] ENDOSCOPY;  Service: Endoscopy;  Laterality: N/A;    History   Social History  . Marital Status: Married    Spouse Name: N/A    Number of Children: N/A  . Years of Education: N/A   Occupational History  . Not on file.   Social History Main Topics  . Smoking status: Never Smoker   . Smokeless tobacco: Never Used  . Alcohol Use: No    . Drug Use: No  . Sexual Activity: No   Other Topics Concern  . Not on file   Social History Narrative  . No narrative on file    Family History  Problem Relation Age of Onset  . Colon cancer      Allergies as of 08/11/2013 - Review Complete 08/08/2013  Allergen Reaction Noted  . Codeine Itching and Nausea And Vomiting 11/16/2011    Current Outpatient Prescriptions on File Prior to Visit  Medication Sig Dispense Refill  . albuterol (PROVENTIL) (2.5 MG/3ML) 0.083% nebulizer solution Take 2.5 mg by nebulization every 6 (six) hours as needed for wheezing. For wheezing      . aspirin EC 81 MG tablet Take 81 mg by mouth daily.      Marland Kitchen atorvastatin (LIPITOR) 40 MG tablet Take 40 mg by mouth every morning.       . DULoxetine (CYMBALTA) 30 MG capsule Take 30 mg by mouth every morning.      . furosemide (LASIX) 20 MG tablet Take 20 mg by mouth daily.      . insulin aspart (NOVOLOG) 100 UNIT/ML injection Inject 16 Units into the skin 4 (four) times daily -  before meals and at bedtime.      Marland Kitchen  insulin detemir (LEVEMIR) 100 UNIT/ML injection Inject 30 Units into the skin at bedtime.       . loratadine (CLARITIN) 10 MG tablet Take 10 mg by mouth every morning.       . losartan (COZAAR) 25 MG tablet Take 25 mg by mouth daily.      . lubiprostone (AMITIZA) 24 MCG capsule Take 24 mcg by mouth daily with breakfast.      . pantoprazole (PROTONIX) 40 MG tablet Take 40 mg by mouth every morning.      . potassium chloride (K-DUR) 10 MEQ tablet Take 10 mEq by mouth every morning.      . timolol (BETIMOL) 0.5 % ophthalmic solution Place 1 drop into both eyes 2 (two) times daily.       No current facility-administered medications on file prior to visit.     REVIEW OF SYSTEMS: Cardiovascular: No chest pain, chest pressure, palpitations, orthopnea, or dyspnea on exertion. No claudication or rest pain,  No history of DVT or phlebitis. Pulmonary: Positive for asthma. Neurologic: No weakness,  paresthesias, aphasia, or amaurosis. No dizziness.  Poor vision Hematologic: No bleeding problems or clotting disorders. Musculoskeletal: No joint pain or joint swelling. Gastrointestinal: No blood in stool or hematemesis Genitourinary: No dysuria or hematuria. Psychiatric:: No history of major depression. Integumentary: No rashes or ulcers. Constitutional: No fever or chills.  PHYSICAL EXAMINATION: General: The patient appears their stated age.  Vital signs are BP 152/84  Pulse 77  Ht 5' (1.524 m)  Wt 185 lb 14.4 oz (84.324 kg)  BMI 36.31 kg/m2  SpO2 100% HEENT:  No gross abnormalities Pulmonary: Respirations are non-labored Abdomen: Soft and non-tender  Musculoskeletal: There are no major deformities.   Neurologic: No focal weakness or paresthesias are detected, Skin: There are no ulcer or rashes noted. Psychiatric: The patient has normal affect. Cardiovascular: There is a regular rate and rhythm without significant murmur appreciated.  Palpable radial and brachial pulse on the right  Diagnostic Studies: Vein mapping has been ordered and reviewed.  She has marginal veins on the right side.  Her left side veins are not usable.   Assessment:  Stage IV renal insufficiency Plan: I had a lengthy discussion with the patient and her son regarding dialysis access.  She is not yet on dialysis and I think all attempts to utilize her native vein should be performed.  The veins on her left arm by ultrasound are not adequate.  On the right side she has a marginal cephalic vein.  The basilic vein is slightly larger.  The cephalic vein on the right is a fusiform diameter measuring 0.2.  The basilic vein is slightly larger at 0.3 cm.  Initially I have decided to proceed with a right arm cephalic vein fistula either starting at the wrist or antecubital crease, depending on evaluation of the vein during the operation.  At the cephalic vein appears to be inadequate, I would consider doing a right  basilic vein transposition.  The patient's questions were answered today her operation is been scheduled for this Thursday, February 12     V. Wells Kerstin Crusoe IV, M.D. [facility] [place] Office: [phone] Pager:  [phone]   

## 2013-08-12 ENCOUNTER — Other Ambulatory Visit: Payer: Self-pay

## 2013-08-12 ENCOUNTER — Encounter (HOSPITAL_COMMUNITY): Payer: Self-pay | Admitting: Pharmacy Technician

## 2013-08-12 NOTE — Pre-Procedure Instructions (Signed)
Savannah Lindsey  08/12/2013   Your procedure is scheduled on: Thursday, Feb. 12th   Report to [place]  2 * 3 at  5:30 AM.  Call this number if you have problems the morning of surgery: [phone]   Remember:   Do not eat food or drink liquids after midnight Wednesday.   Take these medicines the morning of surgery with A SIP OF WATER: Cymbalta, Protonix, Albuterol nebulizer treatment.                            DO NOT take any diabetes medication that morning!!!   Do not wear jewelry, make-up or nail polish.  Do not wear lotions, powders, or perfumes. You may NOT wear deodorant.  Do not shave 48 hours prior to surgery.   Do not bring valuables to the hospital.  [facility] is not responsible for any belongings or valuables.                Name and phone number of your driver:                   Special Instructions: [place] - Preparing for Surgery  Before surgery, you can play an important role.  Because skin is not sterile, your skin needs to be as free of germs as possible.  You can reduce the number of germs on you skin by washing with CHG (chlorahexidine gluconate) soap before surgery.  CHG is an antiseptic cleaner which kills germs and bonds with the skin to continue killing germs even after washing.  Please DO NOT use if you have an allergy to CHG or antibacterial soaps.  If your skin becomes reddened/irritated stop using the CHG and inform your nurse when you arrive at Short Stay.  Do not shave (including legs and underarms) for at least 48 hours prior to the first CHG shower.  You may shave your face.  Please follow these instructions carefully:   1.  Shower with CHG Soap the night before surgery and the morning of Surgery.  2.  If you choose to wash your hair, wash your hair first as usual with your normal shampoo.  3.  After you shampoo, rinse your hair and body thoroughly to remove the Shampoo.   4.  Use CHG as you would any other  liquid soap.  You can apply chg directly to the skin and wash gently with scrungie or a clean washcloth.  5.  Apply the CHG Soap to your body ONLY FROM THE NECK DOWN.  Do not use on open wounds or open sores.  Avoid contact with your eyes,        ears, mouth and genitals (private parts).  Wash genitals (private parts) with your normal soap.   6.  Wash thoroughly, paying special attention to the area where your surgery will be performed.  7.  Thoroughly rinse your body with warm water from the neck down.  8.  DO NOT shower/wash with your normal soap after using and rinsing off the CHG Soap.  9.  Pat yourself dry with a clean towel.            10.  Wear clean pajamas.            11.  Place clean sheets on your bed the night of your first shower and do not sleep with pets.     Day of Surgery  Do not apply any lotions/deoderants the morning of surgery.  Please wear clean clothes to the hospital/surgery center.     Please read over the following fact sheets that you were given: Pain Booklet and Surgical Site Infection Prevention

## 2013-08-13 ENCOUNTER — Encounter (HOSPITAL_COMMUNITY)
Admission: RE | Admit: 2013-08-13 | Discharge: 2013-08-13 | Disposition: A | Discharge status: Home / Self Care | Payer: Medicare Other | Source: Ambulatory Visit | Attending: Surgery | Admitting: Surgery

## 2013-08-13 ENCOUNTER — Encounter (HOSPITAL_COMMUNITY): Payer: Self-pay

## 2013-08-13 MED ORDER — DEXTROSE 5 % IV SOLN
1.5000 g | INTRAVENOUS | Status: AC
  Administered 2013-08-14: 1.5 g via INTRAVENOUS
  Filled 2013-08-13: qty 1.5

## 2013-08-13 MED ORDER — SODIUM CHLORIDE 0.9 % IV SOLN
INTRAVENOUS | Status: DC
  Administered 2013-08-14: 07:00:00 via INTRAVENOUS

## 2013-08-14 ENCOUNTER — Ambulatory Visit (HOSPITAL_COMMUNITY)
Admission: RE | Admit: 2013-08-14 | Discharge: 2013-08-14 | Disposition: A | Discharge status: Home / Self Care | Payer: Medicare Other | Source: Ambulatory Visit | Attending: Surgery | Admitting: Surgery

## 2013-08-14 ENCOUNTER — Ambulatory Visit (HOSPITAL_COMMUNITY): Payer: Medicare Other | Admitting: Anesthesiology

## 2013-08-14 ENCOUNTER — Encounter (HOSPITAL_COMMUNITY): Payer: Medicare Other | Admitting: Anesthesiology

## 2013-08-14 ENCOUNTER — Encounter (HOSPITAL_COMMUNITY)
Admission: RE | Disposition: A | Discharge status: Home / Self Care | Payer: Self-pay | Source: Ambulatory Visit | Attending: Surgery

## 2013-08-14 ENCOUNTER — Other Ambulatory Visit: Payer: Self-pay

## 2013-08-14 ENCOUNTER — Other Ambulatory Visit: Payer: Self-pay | Admitting: *Deleted

## 2013-08-14 ENCOUNTER — Encounter (HOSPITAL_COMMUNITY): Payer: Self-pay | Admitting: *Deleted

## 2013-08-14 DIAGNOSIS — T82898A Other specified complication of vascular prosthetic devices, implants and grafts, initial encounter: Secondary | ICD-10-CM

## 2013-08-14 DIAGNOSIS — I69998 Other sequelae following unspecified cerebrovascular disease: Secondary | ICD-10-CM | POA: Insufficient documentation

## 2013-08-14 DIAGNOSIS — Z7982 Long term (current) use of aspirin: Secondary | ICD-10-CM | POA: Insufficient documentation

## 2013-08-14 DIAGNOSIS — J4489 Other specified chronic obstructive pulmonary disease: Secondary | ICD-10-CM | POA: Insufficient documentation

## 2013-08-14 DIAGNOSIS — K219 Gastro-esophageal reflux disease without esophagitis: Secondary | ICD-10-CM | POA: Insufficient documentation

## 2013-08-14 DIAGNOSIS — H5440 Blindness, one eye, unspecified eye: Secondary | ICD-10-CM | POA: Insufficient documentation

## 2013-08-14 DIAGNOSIS — K222 Esophageal obstruction: Secondary | ICD-10-CM | POA: Insufficient documentation

## 2013-08-14 DIAGNOSIS — D649 Anemia, unspecified: Secondary | ICD-10-CM | POA: Insufficient documentation

## 2013-08-14 DIAGNOSIS — F411 Generalized anxiety disorder: Secondary | ICD-10-CM | POA: Insufficient documentation

## 2013-08-14 DIAGNOSIS — R29898 Other symptoms and signs involving the musculoskeletal system: Secondary | ICD-10-CM | POA: Insufficient documentation

## 2013-08-14 DIAGNOSIS — Z48812 Encounter for surgical aftercare following surgery on the circulatory system: Secondary | ICD-10-CM

## 2013-08-14 DIAGNOSIS — E7800 Pure hypercholesterolemia, unspecified: Secondary | ICD-10-CM | POA: Insufficient documentation

## 2013-08-14 DIAGNOSIS — I120 Hypertensive chronic kidney disease with stage 5 chronic kidney disease or end stage renal disease: Secondary | ICD-10-CM | POA: Insufficient documentation

## 2013-08-14 DIAGNOSIS — E1129 Type 2 diabetes mellitus with other diabetic kidney complication: Secondary | ICD-10-CM | POA: Insufficient documentation

## 2013-08-14 DIAGNOSIS — Z9981 Dependence on supplemental oxygen: Secondary | ICD-10-CM | POA: Insufficient documentation

## 2013-08-14 DIAGNOSIS — Z794 Long term (current) use of insulin: Secondary | ICD-10-CM | POA: Insufficient documentation

## 2013-08-14 DIAGNOSIS — N186 End stage renal disease: Secondary | ICD-10-CM | POA: Insufficient documentation

## 2013-08-14 DIAGNOSIS — I739 Peripheral vascular disease, unspecified: Secondary | ICD-10-CM | POA: Insufficient documentation

## 2013-08-14 DIAGNOSIS — J449 Chronic obstructive pulmonary disease, unspecified: Secondary | ICD-10-CM | POA: Insufficient documentation

## 2013-08-14 DIAGNOSIS — Y832 Surgical operation with anastomosis, bypass or graft as the cause of abnormal reaction of the patient, or of later complication, without mention of misadventure at the time of the procedure: Secondary | ICD-10-CM | POA: Insufficient documentation

## 2013-08-14 DIAGNOSIS — Z4931 Encounter for adequacy testing for hemodialysis: Secondary | ICD-10-CM

## 2013-08-14 DIAGNOSIS — Z992 Dependence on renal dialysis: Secondary | ICD-10-CM | POA: Insufficient documentation

## 2013-08-14 DIAGNOSIS — N184 Chronic kidney disease, stage 4 (severe): Secondary | ICD-10-CM

## 2013-08-14 DIAGNOSIS — Z6836 Body mass index (BMI) 36.0-36.9, adult: Secondary | ICD-10-CM | POA: Insufficient documentation

## 2013-08-14 HISTORY — PX: AV FISTULA PLACEMENT: SHX1204

## 2013-08-14 LAB — POCT I-STAT 4, (NA,K, GLUC, HGB,HCT)
Glucose, Bld: 198 mg/dL — ABNORMAL HIGH (ref 70–99)
HCT: 31 % — ABNORMAL LOW (ref 36.0–46.0)
HEMOGLOBIN: 10.5 g/dL — AB (ref 12.0–15.0)
Potassium: 3.4 mEq/L — ABNORMAL LOW (ref 3.7–5.3)
Sodium: 145 mEq/L (ref 137–147)

## 2013-08-14 LAB — GLUCOSE, CAPILLARY
Glucose-Capillary: 195 mg/dL — ABNORMAL HIGH (ref 70–99)
Glucose-Capillary: 150 mg/dL — ABNORMAL HIGH (ref 70–99)

## 2013-08-14 SURGERY — ARTERIOVENOUS (AV) FISTULA CREATION
Anesthesia: Monitor Anesthesia Care | Site: Arm Lower | Laterality: Right

## 2013-08-14 SURGERY — ARTERIOVENOUS (AV) FISTULA CREATION
Anesthesia: Monitor Anesthesia Care | Site: Arm Upper | Laterality: Right

## 2013-08-14 MED ORDER — MIDAZOLAM HCL 5 MG/5ML IJ SOLN
INTRAMUSCULAR | Status: DC | PRN
  Administered 2013-08-14: 1 mg via INTRAVENOUS

## 2013-08-14 MED ORDER — LIDOCAINE HCL (CARDIAC) 20 MG/ML IV SOLN
INTRAVENOUS | Status: AC
  Filled 2013-08-14: qty 5

## 2013-08-14 MED ORDER — LIDOCAINE HCL (CARDIAC) 20 MG/ML IV SOLN
INTRAVENOUS | Status: DC | PRN
  Administered 2013-08-14: 20 mg via INTRAVENOUS
  Administered 2013-08-14: 50 mg via INTRAVENOUS

## 2013-08-14 MED ORDER — SUCCINYLCHOLINE CHLORIDE 20 MG/ML IJ SOLN
INTRAMUSCULAR | Status: AC
  Filled 2013-08-14: qty 1

## 2013-08-14 MED ORDER — PROTAMINE SULFATE 10 MG/ML IV SOLN
INTRAVENOUS | Status: DC | PRN
  Administered 2013-08-14: 15 mg via INTRAVENOUS
  Administered 2013-08-14: 10 mg via INTRAVENOUS

## 2013-08-14 MED ORDER — SODIUM CHLORIDE 0.9 % IJ SOLN
INTRAMUSCULAR | Status: AC
  Filled 2013-08-14: qty 10

## 2013-08-14 MED ORDER — CEFAZOLIN SODIUM 1-5 GM-% IV SOLN
INTRAVENOUS | Status: AC
  Filled 2013-08-14: qty 50

## 2013-08-14 MED ORDER — HEPARIN SODIUM (PORCINE) 1000 UNIT/ML IJ SOLN
INTRAMUSCULAR | Status: DC | PRN
  Administered 2013-08-14: 3000 [IU] via INTRAVENOUS

## 2013-08-14 MED ORDER — EPHEDRINE SULFATE 50 MG/ML IJ SOLN
INTRAMUSCULAR | Status: AC
  Filled 2013-08-14: qty 1

## 2013-08-14 MED ORDER — OXYCODONE-ACETAMINOPHEN 5-325 MG PO TABS: 1.0000 | ORAL_TABLET | Freq: Four times a day (QID) | ORAL | Status: DC | PRN

## 2013-08-14 MED ORDER — 0.9 % SODIUM CHLORIDE (POUR BTL) OPTIME
TOPICAL | Status: DC | PRN
  Administered 2013-08-14: 1000 mL

## 2013-08-14 MED ORDER — LIDOCAINE HCL (CARDIAC) 20 MG/ML IV SOLN
INTRAVENOUS | Status: DC | PRN
  Administered 2013-08-14: 20 mg via INTRAVENOUS

## 2013-08-14 MED ORDER — CEFAZOLIN SODIUM-DEXTROSE 2-3 GM-% IV SOLR
2.0000 g | Freq: Once | INTRAVENOUS | Status: AC
  Administered 2013-08-14: 1 g via INTRAVENOUS

## 2013-08-14 MED ORDER — PHENYLEPHRINE 40 MCG/ML (10ML) SYRINGE FOR IV PUSH (FOR BLOOD PRESSURE SUPPORT)
PREFILLED_SYRINGE | INTRAVENOUS | Status: AC
  Filled 2013-08-14: qty 10

## 2013-08-14 MED ORDER — FENTANYL CITRATE 0.05 MG/ML IJ SOLN
INTRAMUSCULAR | Status: AC
  Filled 2013-08-14: qty 5

## 2013-08-14 MED ORDER — HEPARIN SODIUM (PORCINE) 1000 UNIT/ML IJ SOLN
INTRAMUSCULAR | Status: AC
  Filled 2013-08-14: qty 1

## 2013-08-14 MED ORDER — FENTANYL CITRATE 0.05 MG/ML IJ SOLN
INTRAMUSCULAR | Status: DC | PRN
  Administered 2013-08-14 (×2): 50 ug via INTRAVENOUS

## 2013-08-14 MED ORDER — LIDOCAINE-EPINEPHRINE (PF) 1 %-1:200000 IJ SOLN
INTRAMUSCULAR | Status: DC | PRN
  Administered 2013-08-14: 10 mL

## 2013-08-14 MED ORDER — SODIUM CHLORIDE 0.9 % IR SOLN
Status: DC | PRN
  Administered 2013-08-14: 08:00:00

## 2013-08-14 MED ORDER — MIDAZOLAM HCL 2 MG/2ML IJ SOLN
INTRAMUSCULAR | Status: AC
  Filled 2013-08-14: qty 2

## 2013-08-14 MED ORDER — PROPOFOL INFUSION 10 MG/ML OPTIME
INTRAVENOUS | Status: DC | PRN
  Administered 2013-08-14: 50 ug/kg/min via INTRAVENOUS

## 2013-08-14 MED ORDER — PROPOFOL INFUSION 10 MG/ML OPTIME
INTRAVENOUS | Status: DC | PRN
  Administered 2013-08-14: 25 ug/kg/min via INTRAVENOUS

## 2013-08-14 MED ORDER — PROPOFOL 10 MG/ML IV BOLUS
INTRAVENOUS | Status: AC
  Filled 2013-08-14: qty 20

## 2013-08-14 MED ORDER — SODIUM CHLORIDE 0.9 % IR SOLN
Status: DC | PRN
  Administered 2013-08-14: 11:00:00

## 2013-08-14 SURGICAL SUPPLY — 40 items
ADH SKN CLS APL DERMABOND .7 (GAUZE/BANDAGES/DRESSINGS) ×1
ARMBAND PINK RESTRICT EXTREMIT (MISCELLANEOUS) ×3 IMPLANT
CANISTER SUCTION 2500CC (MISCELLANEOUS) ×3 IMPLANT
CLIP TI MEDIUM 6 (CLIP) ×3 IMPLANT
CLIP TI WIDE RED SMALL 6 (CLIP) ×3 IMPLANT
COVER PROBE W GEL 5X96 (DRAPES) ×3 IMPLANT
COVER SURGICAL LIGHT HANDLE (MISCELLANEOUS) ×3 IMPLANT
DERMABOND ADVANCED (GAUZE/BANDAGES/DRESSINGS) ×2
DERMABOND ADVANCED .7 DNX12 (GAUZE/BANDAGES/DRESSINGS) ×1 IMPLANT
ELECT REM PT RETURN 9FT ADLT (ELECTROSURGICAL) ×3
ELECTRODE REM PT RTRN 9FT ADLT (ELECTROSURGICAL) ×1 IMPLANT
GLOVE BIOGEL PI IND STRL 7.0 (GLOVE) IMPLANT
GLOVE BIOGEL PI IND STRL 7.5 (GLOVE) ×1 IMPLANT
GLOVE BIOGEL PI INDICATOR 7.0 (GLOVE) ×2
GLOVE BIOGEL PI INDICATOR 7.5 (GLOVE) ×4
GLOVE SS BIOGEL STRL SZ 7 (GLOVE) IMPLANT
GLOVE SUPERSENSE BIOGEL SZ 7 (GLOVE) ×2
GLOVE SURG SS PI 6.5 STRL IVOR (GLOVE) ×4 IMPLANT
GLOVE SURG SS PI 7.5 STRL IVOR (GLOVE) ×3 IMPLANT
GOWN STRL REUS W/ TWL LRG LVL3 (GOWN DISPOSABLE) ×2 IMPLANT
GOWN STRL REUS W/ TWL XL LVL3 (GOWN DISPOSABLE) ×1 IMPLANT
GOWN STRL REUS W/TWL LRG LVL3 (GOWN DISPOSABLE) ×6
GOWN STRL REUS W/TWL XL LVL3 (GOWN DISPOSABLE) ×3
HEMOSTAT SNOW SURGICEL 2X4 (HEMOSTASIS) IMPLANT
KIT BASIN OR (CUSTOM PROCEDURE TRAY) ×3 IMPLANT
KIT ROOM TURNOVER OR (KITS) ×3 IMPLANT
NDL HYPO 25GX1X1/2 BEV (NEEDLE) ×1 IMPLANT
NEEDLE HYPO 25GX1X1/2 BEV (NEEDLE) ×3 IMPLANT
NS IRRIG 1000ML POUR BTL (IV SOLUTION) ×3 IMPLANT
PACK CV ACCESS (CUSTOM PROCEDURE TRAY) ×3 IMPLANT
PAD ARMBOARD 7.5X6 YLW CONV (MISCELLANEOUS) ×6 IMPLANT
SUT PROLENE 6 0 CC (SUTURE) ×3 IMPLANT
SUT PROLENE 7 0 BV 1 (SUTURE) ×2 IMPLANT
SUT VIC AB 3-0 SH 27 (SUTURE) ×3
SUT VIC AB 3-0 SH 27X BRD (SUTURE) ×1 IMPLANT
SUT VICRYL 4-0 PS2 18IN ABS (SUTURE) IMPLANT
TOWEL OR 17X24 6PK STRL BLUE (TOWEL DISPOSABLE) ×3 IMPLANT
TOWEL OR 17X26 10 PK STRL BLUE (TOWEL DISPOSABLE) ×3 IMPLANT
UNDERPAD 30X30 INCONTINENT (UNDERPADS AND DIAPERS) ×3 IMPLANT
WATER STERILE IRR 1000ML POUR (IV SOLUTION) ×3 IMPLANT

## 2013-08-14 SURGICAL SUPPLY — 37 items
ADH SKN CLS APL DERMABOND .7 (GAUZE/BANDAGES/DRESSINGS) ×1
ARMBAND PINK RESTRICT EXTREMIT (MISCELLANEOUS) ×3 IMPLANT
CANISTER SUCTION 2500CC (MISCELLANEOUS) ×3 IMPLANT
CLIP TI MEDIUM 6 (CLIP) ×3 IMPLANT
CLIP TI WIDE RED SMALL 6 (CLIP) ×3 IMPLANT
COVER PROBE W GEL 5X96 (DRAPES) ×3 IMPLANT
COVER SURGICAL LIGHT HANDLE (MISCELLANEOUS) ×3 IMPLANT
DERMABOND ADVANCED (GAUZE/BANDAGES/DRESSINGS) ×2
DERMABOND ADVANCED .7 DNX12 (GAUZE/BANDAGES/DRESSINGS) ×1 IMPLANT
ELECT REM PT RETURN 9FT ADLT (ELECTROSURGICAL) ×3
ELECTRODE REM PT RTRN 9FT ADLT (ELECTROSURGICAL) ×1 IMPLANT
GLOVE BIOGEL PI IND STRL 6.5 (GLOVE) IMPLANT
GLOVE BIOGEL PI IND STRL 7.5 (GLOVE) ×1 IMPLANT
GLOVE BIOGEL PI INDICATOR 6.5 (GLOVE) ×6
GLOVE BIOGEL PI INDICATOR 7.5 (GLOVE) ×2
GLOVE SURG SS PI 6.5 STRL IVOR (GLOVE) ×6 IMPLANT
GLOVE SURG SS PI 7.5 STRL IVOR (GLOVE) ×3 IMPLANT
GOWN STRL REUS W/ TWL LRG LVL3 (GOWN DISPOSABLE) ×2 IMPLANT
GOWN STRL REUS W/ TWL XL LVL3 (GOWN DISPOSABLE) ×1 IMPLANT
GOWN STRL REUS W/TWL LRG LVL3 (GOWN DISPOSABLE) ×6
GOWN STRL REUS W/TWL XL LVL3 (GOWN DISPOSABLE) ×3
HEMOSTAT SNOW SURGICEL 2X4 (HEMOSTASIS) IMPLANT
KIT BASIN OR (CUSTOM PROCEDURE TRAY) ×3 IMPLANT
KIT ROOM TURNOVER OR (KITS) ×3 IMPLANT
NDL HYPO 25GX1X1/2 BEV (NEEDLE) ×1 IMPLANT
NEEDLE HYPO 25GX1X1/2 BEV (NEEDLE) ×3 IMPLANT
NS IRRIG 1000ML POUR BTL (IV SOLUTION) ×3 IMPLANT
PACK CV ACCESS (CUSTOM PROCEDURE TRAY) ×3 IMPLANT
PAD ARMBOARD 7.5X6 YLW CONV (MISCELLANEOUS) ×6 IMPLANT
SUT PROLENE 6 0 CC (SUTURE) ×3 IMPLANT
SUT VIC AB 3-0 SH 27 (SUTURE) ×3
SUT VIC AB 3-0 SH 27X BRD (SUTURE) ×1 IMPLANT
SUT VICRYL 4-0 PS2 18IN ABS (SUTURE) IMPLANT
TOWEL OR 17X24 6PK STRL BLUE (TOWEL DISPOSABLE) ×3 IMPLANT
TOWEL OR 17X26 10 PK STRL BLUE (TOWEL DISPOSABLE) ×3 IMPLANT
UNDERPAD 30X30 INCONTINENT (UNDERPADS AND DIAPERS) ×3 IMPLANT
WATER STERILE IRR 1000ML POUR (IV SOLUTION) ×3 IMPLANT

## 2013-08-14 NOTE — Preoperative (Signed)
Beta Blockers   Reason not to administer Beta Blockers:Not Applicable 

## 2013-08-14 NOTE — H&P (View-Only) (Signed)
The patient right radiocephalic fistula which was created this morning has occluded in the PACU.  I discussed with the patient, that the best course of action is to get back to the operating room and proceed with a more proximal fistula.  The patient and her son are in agreement with this. 

## 2013-08-14 NOTE — H&P (View-Only) (Signed)
Patient name: Savannah Lindsey MRN: [id number] DOB: [birth year] Sex: female   Referred by: Dr. Detterding  Reason for referral:  Chief Complaint  Patient presents with  . New Evaluation    access placement     HISTORY OF PRESENT ILLNESS: This is a 68 year old female who is referred for evaluation for dialysis access.  The patient is right-handed.  Her renal failure is secondary to hypertension and diabetes.  Complicating features includes secondary hyperparathyroidism.  She is on an ARB for hypertension.  Her hypercholesterolemia is managed with a statin.  She is on insulin for her diabetes.  Her biggest limitation is her COPD.  She is a nonsmoker.  Past Medical History  Diagnosis Date  . Diabetes mellitus   . Hypertension   . COPD (chronic obstructive pulmonary disease)   . GERD (gastroesophageal reflux disease)   . Hypercholesteremia   . Anxiety   . Stroke   . Headaches due to old head injury   . Blind one eye   . Angina   . Shortness of breath   . Pneumonia   . Recurrent upper respiratory infection (URI)   . Anemia   . Arthritis   . Complication of anesthesia     difficulty waking up  . Chronic kidney disease   . Cancer     skin cancer right thumb  . Temporal arteritis 11/17/2011  . Gastric polyp     antral  . Stenosis of esophagus     Past Surgical History  Procedure Laterality Date  . Abdominal hysterectomy    . Cholecystectomy    . Heel spur surgery      bilateral  . Brain surgery    . Esophagogastroduodenoscopy  11/18/2011    Procedure: ESOPHAGOGASTRODUODENOSCOPY (EGD);  Surgeon: Gatha Mayer, MD;  Location: [facility] ENDOSCOPY;  Service: Endoscopy;  Laterality: N/A;    History   Social History  . Marital Status: Married    Spouse Name: N/A    Number of Children: N/A  . Years of Education: N/A   Occupational History  . Not on file.   Social History Main Topics  . Smoking status: Never Smoker   . Smokeless tobacco: Never Used  . Alcohol Use: No    . Drug Use: No  . Sexual Activity: No   Other Topics Concern  . Not on file   Social History Narrative  . No narrative on file    Family History  Problem Relation Age of Onset  . Colon cancer      Allergies as of 08/11/2013 - Review Complete 08/08/2013  Allergen Reaction Noted  . Codeine Itching and Nausea And Vomiting 11/16/2011    Current Outpatient Prescriptions on File Prior to Visit  Medication Sig Dispense Refill  . albuterol (PROVENTIL) (2.5 MG/3ML) 0.083% nebulizer solution Take 2.5 mg by nebulization every 6 (six) hours as needed for wheezing. For wheezing      . aspirin EC 81 MG tablet Take 81 mg by mouth daily.      Marland Kitchen atorvastatin (LIPITOR) 40 MG tablet Take 40 mg by mouth every morning.       . DULoxetine (CYMBALTA) 30 MG capsule Take 30 mg by mouth every morning.      . furosemide (LASIX) 20 MG tablet Take 20 mg by mouth daily.      . insulin aspart (NOVOLOG) 100 UNIT/ML injection Inject 16 Units into the skin 4 (four) times daily -  before meals and at bedtime.      Marland Kitchen  insulin detemir (LEVEMIR) 100 UNIT/ML injection Inject 30 Units into the skin at bedtime.       Marland Kitchen loratadine (CLARITIN) 10 MG tablet Take 10 mg by mouth every morning.       Marland Kitchen losartan (COZAAR) 25 MG tablet Take 25 mg by mouth daily.      Marland Kitchen lubiprostone (AMITIZA) 24 MCG capsule Take 24 mcg by mouth daily with breakfast.      . pantoprazole (PROTONIX) 40 MG tablet Take 40 mg by mouth every morning.      . potassium chloride (K-DUR) 10 MEQ tablet Take 10 mEq by mouth every morning.      . timolol (BETIMOL) 0.5 % ophthalmic solution Place 1 drop into both eyes 2 (two) times daily.       No current facility-administered medications on file prior to visit.     REVIEW OF SYSTEMS: Cardiovascular: No chest pain, chest pressure, palpitations, orthopnea, or dyspnea on exertion. No claudication or rest pain,  No history of DVT or phlebitis. Pulmonary: Positive for asthma. Neurologic: No weakness,  paresthesias, aphasia, or amaurosis. No dizziness.  Poor vision Hematologic: No bleeding problems or clotting disorders. Musculoskeletal: No joint pain or joint swelling. Gastrointestinal: No blood in stool or hematemesis Genitourinary: No dysuria or hematuria. Psychiatric:: No history of major depression. Integumentary: No rashes or ulcers. Constitutional: No fever or chills.  PHYSICAL EXAMINATION: General: The patient appears their stated age.  Vital signs are BP 152/84  Pulse 77  Ht 5' (1.524 m)  Wt 185 lb 14.4 oz (84.324 kg)  BMI 36.31 kg/m2  SpO2 100% HEENT:  No gross abnormalities Pulmonary: Respirations are non-labored Abdomen: Soft and non-tender  Musculoskeletal: There are no major deformities.   Neurologic: No focal weakness or paresthesias are detected, Skin: There are no ulcer or rashes noted. Psychiatric: The patient has normal affect. Cardiovascular: There is a regular rate and rhythm without significant murmur appreciated.  Palpable radial and brachial pulse on the right  Diagnostic Studies: Vein mapping has been ordered and reviewed.  She has marginal veins on the right side.  Her left side veins are not usable.   Assessment:  Stage IV renal insufficiency Plan: I had a lengthy discussion with the patient and her son regarding dialysis access.  She is not yet on dialysis and I think all attempts to utilize her native vein should be performed.  The veins on her left arm by ultrasound are not adequate.  On the right side she has a marginal cephalic vein.  The basilic vein is slightly larger.  The cephalic vein on the right is a fusiform diameter measuring 0.2.  The basilic vein is slightly larger at 0.3 cm.  Initially I have decided to proceed with a right arm cephalic vein fistula either starting at the wrist or antecubital crease, depending on evaluation of the vein during the operation.  At the cephalic vein appears to be inadequate, I would consider doing a right  basilic vein transposition.  The patient's questions were answered today her operation is been scheduled for this Thursday, February 12     V. Leia Alf, M.D. [facility] [place] Office: [phone] Pager:  [phone]

## 2013-08-14 NOTE — Op Note (Signed)
    Patient name: Savannah Lindsey MRN: [id number] DOB: [birth year] Sex: female  08/14/2013 Pre-operative Diagnosis: ESRD  Post-operative diagnosis:  Same Surgeon:  Eldridge Abrahams Assistants:  Lennie Muckle  Procedure:   Right radio-cephalic AV fistula Anesthesia:  MAC Blood Loss:  See anesthesia record Specimens:  None  Findings:  Very small artery and vein.  The vein was approximately 2 mm the artery was approximately 1.5 mm  Indications:  The patient comes in for dialysis access.  She is not yet on dialysis.  Procedure:  The patient was identified in the holding area and taken to [place] [facility]  The patient was then placed supine on the table. MAC anesthesia was administered.  The patient was prepped and draped in the usual sterile fashion.  A time out was called and antibiotics were administered.  Ultrasound was used to map the course of the cephalic vein in the upper arm.  The vein was of uniform caliber approximately 2 mm throughout the arm.  Because of this I elected to start at the wrist.  1% lidocaine was used for local anesthesia.  A longitudinal incision was made 2 fingerbreadths proximal to the wrist.  I dissected out the cephalic vein.  This was on the small side measuring 1.5-2 mm.  It was fully mobilized.  Side branches were ligated between silk ties.  Next I dissected out the radial artery.  It had a good pulse but was very small approximately 1.5 mm.  The vein was ligated distally.  I dilated the vein up to a #3 dilator.  The artery was then occluded.  I did not given a heparin.  An arteriotomy was made with a #11 blade which was extended longitudinally with Potts scissors.  A running anastomosis was created with 7-0 Prolene.  Prior to completion, the appropriate flushing maneuvers were performed.  The anastomosis was completed.  There was a palpable thrill within the fistula.  The vein and dilated to approximately 2.5 mm.  The patient had a palpable distal radial pulse.  I did not get  a good Doppler signal distal to the anastomosis.  I felt that the hand had adequate blood flow given the palpable pulse in the distal radial artery distal to the anastomosis.  I felt if I took down the anastomosis that I would not be able to recreate the fistula given the size of the vein.  Therefore, I elected to leave it alone.  Once hemostasis was satisfactory, the incision was closed with 2 layers of 3-0 Vicryl and Dermabond applied.   Disposition:  To PACU in stable condition.   Theotis Burrow, M.D. [facility] Office: [phone] Pager:  [phone]

## 2013-08-14 NOTE — Anesthesia Postprocedure Evaluation (Signed)
  Anesthesia Post-op Note  Patient: Savannah Lindsey  Procedure(s) Performed: Procedure(s):  CREATION BRACHIOCEPHALIC ARTERIOVENOUS (AV) FISTULA (Right)  Patient Location: PACU  Anesthesia Type:MAC  Level of Consciousness: awake, alert  and oriented  Airway and Oxygen Therapy: Patient Spontanous Breathing and Patient connected to nasal cannula oxygen  Post-op Pain: mild  Post-op Assessment: Post-op Vital signs reviewed, Patient's Cardiovascular Status Stable, Respiratory Function Stable, Patent Airway, No signs of Nausea or vomiting and Pain level controlled  Post-op Vital Signs: Reviewed and stable  Complications: No apparent anesthesia complications

## 2013-08-14 NOTE — Progress Notes (Signed)
Pt on hold waiting to go back to sx

## 2013-08-14 NOTE — Op Note (Signed)
    Patient name: Savannah Lindsey MRN: [id number] DOB: [birth year] Sex: female  08/14/2013 Pre-operative Diagnosis:  End-stage renal disease Post-operative diagnosis:  Same Surgeon:  Eldridge Abrahams Assistants:  Lennie Muckle Procedure:   Right brachiocephalic fistula Anesthesia:  MAC Blood Loss:  See anesthesia record Specimens:  None  Findings:  3.5 mm artery, 2.5 mm vein  Indications:  Earlier today the patient underwent radiocephalic fistula placement.  This occluded in the recovery room.  She comes back for a more proximal fistula.  Procedure:  The patient was identified in the holding area and taken to [place] 11  The patient was then placed supine on the table. MAC anesthesia was administered.  The patient was prepped and draped in the usual sterile fashion.  A time out was called and antibiotics were administered.  Ultrasound identified an adequate cephalic vein in the antecubital crease.  One percent lidocaine was used for local anesthesia.  A transverse incision was made antecubital crease.  I dissected out the vein first.  This is approximately a 2.5 mm vein.  I then dissected out of the artery which was disease-free approximately 3.5 mm.  3000 units of heparin were given.  The vein was marked with a pen and ligated distally.  It was instilled with heparin saline and distended nicely.  It accepted a 3.5 dilator.  The artery was occluded with vascular clamps a #11 blade was used to make an arteriotomy which was extended longitudinally with Potts scissors.  The vein was spatulated to fit the size of the arteriotomy.  A running anastomosis was created with 6-0 Prolene.  Prior to completion, the appropriate flushing maneuvers were performed and the anastomosis was completed.  The vein was inspected throughout it's course and was without kinks.  There was a Doppler thrill up to the shoulder.  The patient had a multiphasic ulnar signal and a monophasic radial signal.  Once hemostasis was  satisfactory with 25 mg of protamine, the incision was closed with 2 layers of 3-0 Vicryl.  Dermabond was applied.   Disposition:  To PACU in stable condition.   Theotis Burrow, M.D. [facility] Office: [phone] Pager:  [phone]

## 2013-08-14 NOTE — Anesthesia Postprocedure Evaluation (Signed)
  Anesthesia Post-op Note  Patient: Savannah Lindsey  Procedure(s) Performed: Procedure(s): ARTERIOVENOUS (AV) FISTULA CREATION (Right)  Patient Location: PACU  Anesthesia Type:MAC  Level of Consciousness: awake, alert , oriented and patient cooperative  Airway and Oxygen Therapy: Patient Spontanous Breathing  Post-op Pain: none  Post-op Assessment: Post-op Vital signs reviewed, Patient's Cardiovascular Status Stable, Respiratory Function Stable, Patent Airway, No signs of Nausea or vomiting and Pain level controlled  Post-op Vital Signs: Reviewed and stable  Complications: No apparent anesthesia complications

## 2013-08-14 NOTE — Anesthesia Preprocedure Evaluation (Addendum)
Anesthesia Evaluation  Patient identified by MRN, date of birth, ID band Patient awake    Reviewed: Allergy & Precautions, H&P , NPO status , Patient's Chart, lab work & pertinent test results  History of Anesthesia Complications Negative for: history of anesthetic complications  Airway Mallampati: II TM Distance: >3 FB Neck ROM: Full    Dental  (+) Teeth Intact   Pulmonary shortness of breath, COPD (O2 at night) COPD inhaler and oxygen dependent,  breath sounds clear to auscultation        Cardiovascular hypertension, Pt. on medications + Peripheral Vascular Disease Rhythm:Regular Rate:Normal  7/14 ECHO: EF 60-65%, valves OK   Neuro/Psych  Headaches, CVA (L weakness), Residual Symptoms    GI/Hepatic Neg liver ROS, GERD-  Medicated and Controlled,  Endo/Other  diabetes (glu 195), Insulin Dependent  Renal/GU CRFRenal disease (no dialysis yet)     Musculoskeletal   Abdominal (+) + obese,   Peds  Hematology  (+) Blood dyscrasia (Hb 10.5), anemia ,   Anesthesia Other Findings   Reproductive/Obstetrics                          Anesthesia Physical Anesthesia Plan  ASA: III  Anesthesia Plan: MAC   Post-op Pain Management:    Induction: Intravenous  Airway Management Planned: Simple Face Mask and Natural Airway  Additional Equipment:   Intra-op Plan:   Post-operative Plan:   Informed Consent: I have reviewed the patients History and Physical, chart, labs and discussed the procedure including the risks, benefits and alternatives for the proposed anesthesia with the patient or authorized representative who has indicated his/her understanding and acceptance.     Plan Discussed with: CRNA and Surgeon  Anesthesia Plan Comments: (Plan routine monitors, MAC)        Anesthesia Quick Evaluation

## 2013-08-14 NOTE — Progress Notes (Signed)
Spoke with Dr Trula Kai Railsback Re fistula, absence of thrill and bruit.  He states this is ok, and pt may cont to be discharged home.

## 2013-08-14 NOTE — Interval H&P Note (Signed)
History and Physical Interval Note:  08/14/2013 7:11 AM  Savannah Lindsey  has presented today for surgery, with the diagnosis of ESRD  The various methods of treatment have been discussed with the patient and family. After consideration of risks, benefits and other options for treatment, the patient has consented to  Procedure(s): ARTERIOVENOUS (AV) FISTULA CREATION (Right) as a surgical intervention .  The patient's history has been reviewed, patient examined, no change in status, stable for surgery.  I have reviewed the patient's chart and labs.  Questions were answered to the patient's satisfaction.     Savannah Lindsey IV, V. WELLS

## 2013-08-14 NOTE — Progress Notes (Signed)
Dr Trula Shaleka Brines at bedside.  States pt will need to go back to sx today, and to obtain consent for this second procedure

## 2013-08-14 NOTE — Discharge Instructions (Signed)

## 2013-08-14 NOTE — Progress Notes (Signed)
The patient right radiocephalic fistula which was created this morning has occluded in the PACU.  I discussed with the patient, that the best course of action is to get back to the operating room and proceed with a more proximal fistula.  The patient and her son are in agreement with this.

## 2013-08-14 NOTE — Transfer of Care (Signed)
Immediate Anesthesia Transfer of Care Note  Patient: Savannah Lindsey  Procedure(s) Performed: Procedure(s):  CREATION BRACHIOCEPHALIC ARTERIOVENOUS (AV) FISTULA (Right)  Patient Location: PACU  Anesthesia Type:MAC  Level of Consciousness: awake, alert  and oriented  Airway & Oxygen Therapy: Patient Spontanous Breathing and Patient connected to nasal cannula oxygen  Post-op Assessment: Report given to PACU RN and Post -op Vital signs reviewed and stable  Post vital signs: Reviewed and stable  Complications: No apparent anesthesia complications

## 2013-08-14 NOTE — Progress Notes (Signed)
Dr Brabham at bedside. 

## 2013-08-14 NOTE — Transfer of Care (Signed)
Immediate Anesthesia Transfer of Care Note  Patient: Savannah Lindsey  Procedure(s) Performed: Procedure(s): ARTERIOVENOUS (AV) FISTULA CREATION (Right)  Patient Location: PACU  Anesthesia Type:MAC  Level of Consciousness: awake, alert  and oriented  Airway & Oxygen Therapy: Patient Spontanous Breathing and Patient connected to nasal cannula oxygen  Post-op Assessment: Report given to PACU RN and Post -op Vital signs reviewed and stable  Post vital signs: Reviewed and stable  Complications: No apparent anesthesia complications

## 2013-08-14 NOTE — Anesthesia Preprocedure Evaluation (Addendum)
Anesthesia Evaluation  Patient identified by MRN, date of birth, ID band Patient awake    Reviewed: Allergy & Precautions, H&P , NPO status , Patient's Chart, lab work & pertinent test results  Airway Mallampati: II      Dental  (+) Edentulous Upper, Edentulous Lower   Pulmonary shortness of breath, COPD (nocturnal O2) COPD inhaler and oxygen dependent,          Cardiovascular hypertension, Pt. on medications - angina+ Peripheral Vascular Disease  7/14 ECHO: EF 60-65%, valves OK   Neuro/Psych  Headaches, CVA ( weakness), Residual Symptoms    GI/Hepatic Neg liver ROS, GERD-  Medicated and Controlled,  Endo/Other  diabetes (glu 150), Insulin DependentMorbid obesity  Renal/GU CRFRenal disease (no dialysis yet)     Musculoskeletal   Abdominal   Peds  Hematology  (+) Blood dyscrasia (Hb 10.5), anemia ,   Anesthesia Other Findings   Reproductive/Obstetrics                         Anesthesia Physical Anesthesia Plan  ASA: III  Anesthesia Plan: MAC   Post-op Pain Management:    Induction: Intravenous  Airway Management Planned: Mask  Additional Equipment:   Intra-op Plan:   Post-operative Plan:   Informed Consent: I have reviewed the patients History and Physical, chart, labs and discussed the procedure including the risks, benefits and alternatives for the proposed anesthesia with the patient or authorized representative who has indicated his/her understanding and acceptance.     Plan Discussed with: Anesthesiologist, CRNA and Surgeon  Anesthesia Plan Comments:         Anesthesia Quick Evaluation

## 2013-08-14 NOTE — Interval H&P Note (Signed)
History and Physical Interval Note:  08/14/2013 11:17 AM  Savannah Lindsey  has presented today for surgery, with the diagnosis of * No pre-op diagnosis entered *  The various methods of treatment have been discussed with the patient and family. After consideration of risks, benefits and other options for treatment, the patient has consented to  Procedure(s): ARTERIOVENOUS (AV) FISTULA CREATION (Right) as a surgical intervention .  The patient's history has been reviewed, patient examined, no change in status, stable for surgery.  I have reviewed the patient's chart and labs.  Questions were answered to the patient's satisfaction.     Rosiland Sen IV, V. WELLS

## 2013-08-15 ENCOUNTER — Encounter (HOSPITAL_COMMUNITY): Payer: Self-pay | Admitting: Surgery

## 2013-08-18 ENCOUNTER — Other Ambulatory Visit: Payer: Self-pay | Admitting: *Deleted

## 2013-08-18 ENCOUNTER — Telehealth: Payer: Self-pay | Admitting: Surgery

## 2013-08-18 DIAGNOSIS — Z4931 Encounter for adequacy testing for hemodialysis: Secondary | ICD-10-CM

## 2013-08-18 DIAGNOSIS — N186 End stage renal disease: Secondary | ICD-10-CM

## 2013-08-18 NOTE — Telephone Encounter (Addendum)
Message copied by Gena Fray on Mon Aug 18, 2013  3:18 PM ------      Message from: Denman George      Created: Thu Aug 14, 2013 12:55 PM      Regarding: Zigmund Daniel log                   ----- Message -----         From: Serafina Mitchell, MD         Sent: 08/14/2013  12:38 PM           To: [facility] Charge Pool            08/14/2013:            The patient's right radiocephalic fistula occluded in the recovery room.  She went back for the following:            Surgeon:  Eldridge Abrahams      Assistants:  Lennie Muckle      Procedure:   Right brachiocephalic fistula            Have her followup in 6 weeks with a duplex ------  08/18/13: lm for pt, dpm

## 2013-08-26 ENCOUNTER — Telehealth: Payer: Self-pay

## 2013-08-26 DIAGNOSIS — G8918 Other acute postprocedural pain: Secondary | ICD-10-CM

## 2013-08-26 MED ORDER — OXYCODONE-ACETAMINOPHEN 5-325 MG PO TABS: ORAL_TABLET | ORAL | Status: DC

## 2013-08-26 NOTE — Telephone Encounter (Signed)
Pt. called to request refill on pain medication.  Reports has pain in the right forearm surgical site, and noted a "slight redness" in the forearm incisional area  Reports the right upper arm (at B-C AVF site)  is not painful.  Denies any incisional drainage or opening of incisions.  Denies fever.  Reports she has tried Tylenol but it doesn't ease the pain.  Reports pain level at 8/10.  Discussed with Dr. Kellie Simmering; approved new Rx for Oxycodone/Acetaminophen 5/325 mg 1 tab q 4-6 hrs/ prn/ #30/ no refills.  Notified pt. of Rx ready to be picked up at office.  Advised to call if symptoms worsen; ie:  Increased redness, tenderness, drainage, or fever.  Verb. Understanding.

## 2013-09-05 ENCOUNTER — Encounter (HOSPITAL_COMMUNITY)
Admission: RE | Admit: 2013-09-05 | Discharge: 2013-09-05 | Disposition: A | Discharge status: Home / Self Care | Payer: Medicare Other | Source: Ambulatory Visit | Attending: Nephrology | Admitting: Nephrology

## 2013-09-05 DIAGNOSIS — N186 End stage renal disease: Secondary | ICD-10-CM | POA: Insufficient documentation

## 2013-09-05 DIAGNOSIS — D631 Anemia in chronic kidney disease: Secondary | ICD-10-CM | POA: Insufficient documentation

## 2013-09-05 DIAGNOSIS — N039 Chronic nephritic syndrome with unspecified morphologic changes: Principal | ICD-10-CM

## 2013-09-05 DIAGNOSIS — I120 Hypertensive chronic kidney disease with stage 5 chronic kidney disease or end stage renal disease: Secondary | ICD-10-CM | POA: Insufficient documentation

## 2013-09-05 LAB — IRON AND TIBC
Iron: 70 ug/dL (ref 42–135)
Saturation Ratios: 29 % (ref 20–55)
TIBC: 239 ug/dL — ABNORMAL LOW (ref 250–470)
UIBC: 169 ug/dL (ref 125–400)

## 2013-09-05 MED ORDER — EPOETIN ALFA 20000 UNIT/ML IJ SOLN
INTRAMUSCULAR | Status: AC
  Administered 2013-09-05: 20000 [IU] via SUBCUTANEOUS
  Filled 2013-09-05: qty 1

## 2013-09-05 MED ORDER — EPOETIN ALFA 20000 UNIT/ML IJ SOLN: 20000.0000 [IU] | INTRAMUSCULAR | Status: DC

## 2013-09-08 LAB — POCT HEMOGLOBIN-HEMACUE: Hemoglobin: 11.3 g/dL — ABNORMAL LOW (ref 12.0–15.0)

## 2013-10-02 ENCOUNTER — Encounter: Payer: Self-pay | Admitting: Surgery

## 2013-10-03 ENCOUNTER — Encounter (HOSPITAL_COMMUNITY): Payer: Medicare Other

## 2013-10-06 ENCOUNTER — Ambulatory Visit (INDEPENDENT_AMBULATORY_CARE_PROVIDER_SITE_OTHER): Payer: Medicare Other | Admitting: Surgery

## 2013-10-06 ENCOUNTER — Encounter: Payer: Self-pay | Admitting: Surgery

## 2013-10-06 ENCOUNTER — Ambulatory Visit (HOSPITAL_COMMUNITY)
Admission: RE | Admit: 2013-10-06 | Discharge: 2013-10-06 | Disposition: A | Discharge status: Home / Self Care | Payer: Medicare Other | Source: Ambulatory Visit | Attending: Surgery | Admitting: Surgery

## 2013-10-06 VITALS — BP 134/79 | HR 75 | Resp 18 | Ht 61.0 in | Wt 189.2 lb

## 2013-10-06 DIAGNOSIS — Z48812 Encounter for surgical aftercare following surgery on the circulatory system: Secondary | ICD-10-CM | POA: Insufficient documentation

## 2013-10-06 DIAGNOSIS — N186 End stage renal disease: Secondary | ICD-10-CM

## 2013-10-06 DIAGNOSIS — Z4931 Encounter for adequacy testing for hemodialysis: Secondary | ICD-10-CM | POA: Insufficient documentation

## 2013-10-06 NOTE — Progress Notes (Signed)
The patient is status post right radiocephalic fistula on 78/67/6720.  This occluded in the PACU and therefore she was taken back for a right brachiocephalic fistula.  She denies symptoms of steal syndrome.  She does complain of pain over the top of the cephalic vein in the right forearm.  There is a good thrill within the cephalic vein on the right.  I reviewed her fistula study.  This shows a marginal diameter vein ranging from 0.43-0.57 cm.  There are several areas of elevated velocities, particularly in the distal upper arm vein.  Patient also appears to have a calcified valve leaflets.  There are 2 branches each measuring 0.28 cm  Because the patient has not yet on dialysis, and there are several issues regarding her fistula based on the ultrasound, I am electing to observe this for another 6 weeks, and bring her back for a repeat fistula duplex.  Based on the results of that study, and she will most likely need some form of intervention.  I will probably start with a fistulogram to evaluate the stenosis and branches.  I feel eventually if this fistula becomes usable, it will need to be elevated.  I discussed this with the patient and family today.  I'll see him back in 6 weeks

## 2013-10-06 NOTE — Addendum Note (Signed)
Addended by: Mena Goes on: 10/06/2013 04:58 PM   Modules accepted: Orders

## 2013-10-10 ENCOUNTER — Encounter (HOSPITAL_COMMUNITY)
Admission: RE | Admit: 2013-10-10 | Discharge: 2013-10-10 | Disposition: A | Discharge status: Home / Self Care | Payer: Medicare Other | Source: Ambulatory Visit | Attending: Nephrology | Admitting: Nephrology

## 2013-10-10 DIAGNOSIS — N186 End stage renal disease: Secondary | ICD-10-CM | POA: Insufficient documentation

## 2013-10-10 DIAGNOSIS — I120 Hypertensive chronic kidney disease with stage 5 chronic kidney disease or end stage renal disease: Secondary | ICD-10-CM | POA: Insufficient documentation

## 2013-10-10 DIAGNOSIS — N039 Chronic nephritic syndrome with unspecified morphologic changes: Principal | ICD-10-CM

## 2013-10-10 DIAGNOSIS — D631 Anemia in chronic kidney disease: Secondary | ICD-10-CM | POA: Insufficient documentation

## 2013-10-10 LAB — POCT HEMOGLOBIN-HEMACUE: HEMOGLOBIN: 10.8 g/dL — AB (ref 12.0–15.0)

## 2013-10-10 LAB — IRON AND TIBC
Iron: 45 ug/dL (ref 42–135)
SATURATION RATIOS: 21 % (ref 20–55)
TIBC: 218 ug/dL — AB (ref 250–470)
UIBC: 173 ug/dL (ref 125–400)

## 2013-10-10 LAB — FERRITIN: FERRITIN: 269 ng/mL (ref 10–291)

## 2013-10-10 MED ORDER — EPOETIN ALFA 20000 UNIT/ML IJ SOLN
INTRAMUSCULAR | Status: AC
  Filled 2013-10-10: qty 1

## 2013-10-10 MED ORDER — EPOETIN ALFA 20000 UNIT/ML IJ SOLN
20000.0000 [IU] | INTRAMUSCULAR | Status: DC
  Administered 2013-10-10: 20000 [IU] via SUBCUTANEOUS

## 2013-10-14 ENCOUNTER — Telehealth: Payer: Self-pay

## 2013-10-14 DIAGNOSIS — R200 Anesthesia of skin: Secondary | ICD-10-CM

## 2013-10-14 NOTE — Telephone Encounter (Signed)
Phone call from pt. stated she has cramping in her right hand, left arm and left leg.  Questioned more in detail re: right hand symptoms, due to s/p right arm AVF creation 08/14/13; stated the "tips of my fingers gets numb, at times."  Also, stated the right forearm "from the elbow to the fingers gets cold at times."  Reported able to grip with right hand, but stated if she holds something in her (R) hand very long, it will cramp-up.  Re: left arm/ left leg symptoms, pt. Denied any swelling of the left upper and lower extremities.  Advised she should call her PCP re: the cramping of her left extremities.  Discussed right hand/ forearm symptoms with Dr. Kellie Simmering.  Per Dr. Kellie Simmering, schedule a steal study and OV with NP to rule out steal syndrome.

## 2013-10-17 ENCOUNTER — Encounter: Payer: Self-pay | Admitting: Family

## 2013-10-20 ENCOUNTER — Other Ambulatory Visit: Payer: Self-pay | Admitting: Vascular Surgery

## 2013-10-20 ENCOUNTER — Encounter: Payer: Self-pay | Admitting: Family

## 2013-10-20 ENCOUNTER — Ambulatory Visit (INDEPENDENT_AMBULATORY_CARE_PROVIDER_SITE_OTHER): Payer: Medicare Other | Admitting: Family

## 2013-10-20 ENCOUNTER — Ambulatory Visit (HOSPITAL_COMMUNITY)
Admission: RE | Admit: 2013-10-20 | Discharge: 2013-10-20 | Disposition: A | Discharge status: Home / Self Care | Payer: Medicare Other | Source: Ambulatory Visit | Attending: Family | Admitting: Family

## 2013-10-20 VITALS — BP 118/70 | HR 84 | Resp 16 | Ht 61.0 in | Wt 188.0 lb

## 2013-10-20 DIAGNOSIS — G8918 Other acute postprocedural pain: Secondary | ICD-10-CM

## 2013-10-20 DIAGNOSIS — R209 Unspecified disturbances of skin sensation: Secondary | ICD-10-CM

## 2013-10-20 DIAGNOSIS — R200 Anesthesia of skin: Secondary | ICD-10-CM

## 2013-10-20 DIAGNOSIS — M79609 Pain in unspecified limb: Secondary | ICD-10-CM

## 2013-10-20 NOTE — Progress Notes (Signed)
Established Dialysis Access  History of Present Illness  Savannah Lindsey is a 68 y.o. (1945/10/23) female patient of Dr. Trula Slade. The patient is status post right radiocephalic fistula on 62/83/6629. This occluded in the PACU and therefore she was taken back for a right brachiocephalic fistula. She denies symptoms of steal syndrome. She does complain of pain over the top of the cephalic vein in the right forearm.  There was a good thrill within the cephalic vein on the right.   10/06/2013 progress note by Dr. Trula Slade: I reviewed her fistula study. This shows a marginal diameter vein ranging from 0.43-0.57 cm. There are several areas of elevated velocities, particularly in the distal upper arm vein. Patient also appears to have a calcified valve leaflets. There are 2 branches each measuring 0.28 cm  Because the patient has not yet on dialysis, and there are several issues regarding her fistula based on the ultrasound, I am electing to observe this for another 6 weeks, and bring her back for a repeat fistula duplex. Based on the results of that study, and she will most likely need some form of intervention. I will probably start with a fistulogram to evaluate the stenosis and branches. I feel eventually if this fistula becomes usable, it will need to be elevated. I discussed this with the patient and family today. I'll see him back in 6 weeks   She returns for 2 weeks history of right hand intermittent numbness at tips of fingers, cold sensation in forearm; these symptoms have been improving over the last couple of days. She has not yet started dialysis.   The patient is right hand dominant.   Last eGFR on file was 15 in November, 2014, son states decreased to 10.3 most recently. The right upper arm access has a week palpable thrill.  The patient's PMH, PSH, SH, FamHx, Med, and Allergies are unchanged from 08/11/2013.  REVIEW OF SYSTEMS: Cardiovascular: No chest pain, chest pressure,  palpitations, orthopnea, or dyspnea on exertion. No claudication or rest pain,  No history of DVT or phlebitis. Pulmonary: No productive cough, asthma or wheezing. Neurologic: No weakness, aphasia, or amaurosis. No dizziness. Hematologic: No bleeding problems or clotting disorders, has anemia of CRF. Musculoskeletal: No joint pain or joint swelling. Gastrointestinal: No blood in stool or hematemesis, c/o LLQ and RLQ mild abdominal pain since straining at stoools. Genitourinary: No dysuria or hematuria. Psychiatric:: No history of major depression. Integumentary: No rashes or ulcers. Constitutional: No fevers, gets cold easily, tires easily.   Physical Examination  PHYSICAL EXAMINATION: General: The patient appears their stated age.   HEENT:  No gross abnormalities Pulmonary: Respirations are non-labored Abdomen: Soft and non-tender. Musculoskeletal: There are no major deformities.   Neurologic: No focal weakness or paresthesias are detected, Skin: There are no ulcer or rashes noted. Psychiatric: The patient has normal affect. Cardiovascular: There is a regular rate and rhythm without significant murmur appreciated. Right upper arm AVF has a weak palpable thrill.  Non-Invasive Vascular Imaging (10/20/2013):  Upper arterial:  The bilateral forearm-brachial and digital-brachial indices are 0.90 or above. Triphasic Doppler waveforms noted in the bilateral forearm arteries. The right radial artery pressure went from 115 mm/Mg at rest to 138 mm/Mg with fistula compression.   Medical Decision Making  Savannah Lindsey is a 68 y.o. female who presents status post right radiocephalic fistula on 47/65/4650. This occluded in the PACU and therefore she was taken back for a right brachiocephalic fistula.  She returns for 2 weeks history  of right hand intermittent numbness at tips of fingers, cold sensation in forearm; these symptoms have been improving over the last couple of days. She has not  yet started dialysis. Based on today's exam, and after discussing with Dr. Trula Slade, patient was advised to return for scheduled fistulogram and follow up with Dr. Trula Slade in May.   Savannah Leyden Nickel, RN, MSN, FNP-C [facility] Office: [phone]  10/20/2013, 12:16 PM  Clinic MD:  Trula Slade

## 2013-10-27 ENCOUNTER — Other Ambulatory Visit (HOSPITAL_COMMUNITY): Payer: Medicare Other

## 2013-10-27 ENCOUNTER — Ambulatory Visit: Payer: Medicare Other | Admitting: Family

## 2013-11-07 ENCOUNTER — Encounter (HOSPITAL_COMMUNITY)
Admission: RE | Admit: 2013-11-07 | Discharge: 2013-11-07 | Disposition: A | Discharge status: Home / Self Care | Payer: Medicare Other | Source: Ambulatory Visit | Attending: Nephrology | Admitting: Nephrology

## 2013-11-07 DIAGNOSIS — N186 End stage renal disease: Secondary | ICD-10-CM | POA: Insufficient documentation

## 2013-11-07 DIAGNOSIS — I120 Hypertensive chronic kidney disease with stage 5 chronic kidney disease or end stage renal disease: Secondary | ICD-10-CM | POA: Insufficient documentation

## 2013-11-07 DIAGNOSIS — N039 Chronic nephritic syndrome with unspecified morphologic changes: Principal | ICD-10-CM

## 2013-11-07 DIAGNOSIS — D631 Anemia in chronic kidney disease: Secondary | ICD-10-CM | POA: Insufficient documentation

## 2013-11-07 LAB — IRON AND TIBC
IRON: 21 ug/dL — AB (ref 42–135)
Saturation Ratios: 10 % — ABNORMAL LOW (ref 20–55)
TIBC: 210 ug/dL — ABNORMAL LOW (ref 250–470)
UIBC: 189 ug/dL (ref 125–400)

## 2013-11-07 LAB — FERRITIN: FERRITIN: 319 ng/mL — AB (ref 10–291)

## 2013-11-07 LAB — POCT HEMOGLOBIN-HEMACUE: Hemoglobin: 9.8 g/dL — ABNORMAL LOW (ref 12.0–15.0)

## 2013-11-07 MED ORDER — EPOETIN ALFA 20000 UNIT/ML IJ SOLN
INTRAMUSCULAR | Status: AC
  Filled 2013-11-07: qty 1

## 2013-11-07 MED ORDER — EPOETIN ALFA 20000 UNIT/ML IJ SOLN
20000.0000 [IU] | INTRAMUSCULAR | Status: DC
  Administered 2013-11-07: 20000 [IU] via SUBCUTANEOUS

## 2013-11-12 ENCOUNTER — Other Ambulatory Visit (HOSPITAL_COMMUNITY): Payer: Self-pay

## 2013-11-13 ENCOUNTER — Inpatient Hospital Stay (HOSPITAL_COMMUNITY): Admission: RE | Admit: 2013-11-13 | Payer: Medicare Other | Source: Ambulatory Visit

## 2013-11-14 ENCOUNTER — Encounter: Payer: Self-pay | Admitting: Surgery

## 2013-11-17 ENCOUNTER — Encounter (HOSPITAL_COMMUNITY): Payer: Self-pay | Admitting: Emergency Medicine

## 2013-11-17 ENCOUNTER — Ambulatory Visit (HOSPITAL_COMMUNITY)
Admission: RE | Admit: 2013-11-17 | Discharge: 2013-11-17 | Disposition: A | Discharge status: Home / Self Care | Payer: Medicare Other | Source: Ambulatory Visit | Attending: Surgery | Admitting: Surgery

## 2013-11-17 ENCOUNTER — Ambulatory Visit (INDEPENDENT_AMBULATORY_CARE_PROVIDER_SITE_OTHER): Payer: Medicare Other | Admitting: Surgery

## 2013-11-17 ENCOUNTER — Encounter: Payer: Self-pay | Admitting: Surgery

## 2013-11-17 ENCOUNTER — Other Ambulatory Visit: Payer: Self-pay

## 2013-11-17 VITALS — BP 138/67 | HR 62 | Resp 16 | Ht 60.0 in | Wt 189.2 lb

## 2013-11-17 DIAGNOSIS — J4489 Other specified chronic obstructive pulmonary disease: Secondary | ICD-10-CM | POA: Insufficient documentation

## 2013-11-17 DIAGNOSIS — Z7982 Long term (current) use of aspirin: Secondary | ICD-10-CM | POA: Insufficient documentation

## 2013-11-17 DIAGNOSIS — Z87828 Personal history of other (healed) physical injury and trauma: Secondary | ICD-10-CM | POA: Insufficient documentation

## 2013-11-17 DIAGNOSIS — F411 Generalized anxiety disorder: Secondary | ICD-10-CM | POA: Insufficient documentation

## 2013-11-17 DIAGNOSIS — Z794 Long term (current) use of insulin: Secondary | ICD-10-CM | POA: Insufficient documentation

## 2013-11-17 DIAGNOSIS — Z85828 Personal history of other malignant neoplasm of skin: Secondary | ICD-10-CM | POA: Insufficient documentation

## 2013-11-17 DIAGNOSIS — Z8669 Personal history of other diseases of the nervous system and sense organs: Secondary | ICD-10-CM | POA: Insufficient documentation

## 2013-11-17 DIAGNOSIS — Z862 Personal history of diseases of the blood and blood-forming organs and certain disorders involving the immune mechanism: Secondary | ICD-10-CM | POA: Insufficient documentation

## 2013-11-17 DIAGNOSIS — Z8701 Personal history of pneumonia (recurrent): Secondary | ICD-10-CM | POA: Insufficient documentation

## 2013-11-17 DIAGNOSIS — T82898A Other specified complication of vascular prosthetic devices, implants and grafts, initial encounter: Secondary | ICD-10-CM | POA: Insufficient documentation

## 2013-11-17 DIAGNOSIS — K5732 Diverticulitis of large intestine without perforation or abscess without bleeding: Secondary | ICD-10-CM | POA: Insufficient documentation

## 2013-11-17 DIAGNOSIS — E7800 Pure hypercholesterolemia, unspecified: Secondary | ICD-10-CM | POA: Insufficient documentation

## 2013-11-17 DIAGNOSIS — Z48812 Encounter for surgical aftercare following surgery on the circulatory system: Secondary | ICD-10-CM

## 2013-11-17 DIAGNOSIS — H5440 Blindness, one eye, unspecified eye: Secondary | ICD-10-CM | POA: Insufficient documentation

## 2013-11-17 DIAGNOSIS — Y849 Medical procedure, unspecified as the cause of abnormal reaction of the patient, or of later complication, without mention of misadventure at the time of the procedure: Secondary | ICD-10-CM | POA: Insufficient documentation

## 2013-11-17 DIAGNOSIS — N189 Chronic kidney disease, unspecified: Secondary | ICD-10-CM | POA: Insufficient documentation

## 2013-11-17 DIAGNOSIS — N186 End stage renal disease: Secondary | ICD-10-CM

## 2013-11-17 DIAGNOSIS — R748 Abnormal levels of other serum enzymes: Secondary | ICD-10-CM | POA: Insufficient documentation

## 2013-11-17 DIAGNOSIS — Z9889 Other specified postprocedural states: Secondary | ICD-10-CM | POA: Insufficient documentation

## 2013-11-17 DIAGNOSIS — Z9089 Acquired absence of other organs: Secondary | ICD-10-CM | POA: Insufficient documentation

## 2013-11-17 DIAGNOSIS — I129 Hypertensive chronic kidney disease with stage 1 through stage 4 chronic kidney disease, or unspecified chronic kidney disease: Secondary | ICD-10-CM | POA: Insufficient documentation

## 2013-11-17 DIAGNOSIS — Z79899 Other long term (current) drug therapy: Secondary | ICD-10-CM | POA: Insufficient documentation

## 2013-11-17 DIAGNOSIS — E119 Type 2 diabetes mellitus without complications: Secondary | ICD-10-CM | POA: Insufficient documentation

## 2013-11-17 DIAGNOSIS — J449 Chronic obstructive pulmonary disease, unspecified: Secondary | ICD-10-CM | POA: Insufficient documentation

## 2013-11-17 DIAGNOSIS — K219 Gastro-esophageal reflux disease without esophagitis: Secondary | ICD-10-CM | POA: Insufficient documentation

## 2013-11-17 DIAGNOSIS — Z90710 Acquired absence of both cervix and uterus: Secondary | ICD-10-CM | POA: Insufficient documentation

## 2013-11-17 DIAGNOSIS — Z8673 Personal history of transient ischemic attack (TIA), and cerebral infarction without residual deficits: Secondary | ICD-10-CM | POA: Insufficient documentation

## 2013-11-17 DIAGNOSIS — M129 Arthropathy, unspecified: Secondary | ICD-10-CM | POA: Insufficient documentation

## 2013-11-17 LAB — COMPREHENSIVE METABOLIC PANEL
ALBUMIN: 3.3 g/dL — AB (ref 3.5–5.2)
ALT: 6 U/L (ref 0–35)
AST: 12 U/L (ref 0–37)
Alkaline Phosphatase: 133 U/L — ABNORMAL HIGH (ref 39–117)
BUN: 32 mg/dL — AB (ref 6–23)
CO2: 20 mEq/L (ref 19–32)
Calcium: 8.8 mg/dL (ref 8.4–10.5)
Chloride: 110 mEq/L (ref 96–112)
Creatinine, Ser: 3.73 mg/dL — ABNORMAL HIGH (ref 0.50–1.10)
GFR calc Af Amer: 13 mL/min — ABNORMAL LOW (ref >90)
GFR calc non Af Amer: 11 mL/min — ABNORMAL LOW (ref >90)
Glucose, Bld: 134 mg/dL — ABNORMAL HIGH (ref 70–99)
POTASSIUM: 4.3 meq/L (ref 3.7–5.3)
Sodium: 144 mEq/L (ref 137–147)
TOTAL PROTEIN: 7.1 g/dL (ref 6.0–8.3)
Total Bilirubin: 0.3 mg/dL (ref 0.3–1.2)

## 2013-11-17 LAB — LIPASE, BLOOD: Lipase: 48 U/L (ref 11–59)

## 2013-11-17 NOTE — ED Notes (Addendum)
PT reports LLQ pain x 4 days with N/V. Pt also reports dysuria x 4 days, but denies hematuria. Denies diarrhea. Denies fever/chills. States she initially thought it was constipation, but no relief with laxatives. Hx: COPD. NAD.

## 2013-11-17 NOTE — Progress Notes (Signed)
Patient name: Savannah Lindsey MRN: [id number] DOB: [birth year] Sex: female     Chief Complaint  Patient presents with  . Follow-up    s/p right brachiocephalic fistula placement  08-14-2013       HISTORY OF PRESENT ILLNESS: The patient is back today for dialysis access evaluation.  On 08/14/2013 she underwent right brachiocephalic fistula.  She reports no symptoms of steal.  Past Medical History  Diagnosis Date  . Hypertension   . COPD (chronic obstructive pulmonary disease)   . GERD (gastroesophageal reflux disease)   . Hypercholesteremia   . Anxiety   . Stroke   . Headaches due to old head injury   . Blind one eye   . Angina   . Shortness of breath   . Pneumonia   . Recurrent upper respiratory infection (URI)   . Anemia   . Arthritis   . Complication of anesthesia     difficulty waking up  . Chronic kidney disease   . Cancer     skin cancer right thumb  . Temporal arteritis 11/17/2011  . Gastric polyp     antral  . Stenosis of esophagus   . Diabetes mellitus     TYpe 2 IDDM x 11 years    Past Surgical History  Procedure Laterality Date  . Abdominal hysterectomy    . Cholecystectomy    . Heel spur surgery      bilateral  . Esophagogastroduodenoscopy  11/18/2011    Procedure: ESOPHAGOGASTRODUODENOSCOPY (EGD);  Surgeon: Gatha Mayer, MD;  [facility];  Service: Endoscopy;  Laterality: N/A;  . Eye surgery      cataracts/IOL  . Brain surgery      temporal arteritis  . Av fistula placement Right 08/14/2013    Procedure: ARTERIOVENOUS (AV) FISTULA CREATION;  Surgeon: Serafina Mitchell, MD;  Location: [place];  Service: Vascular;  Laterality: Right;  . Av fistula placement Right 08/14/2013    Procedure:  CREATION BRACHIOCEPHALIC ARTERIOVENOUS (AV) FISTULA;  Surgeon: Serafina Mitchell, MD;  Location: [place];  Service: Vascular;  Laterality: Right;    History   Social History  . Marital Status: Married    Spouse Name: N/A    Number of Children: N/A  .  Years of Education: N/A   Occupational History  . Not on file.   Social History Main Topics  . Smoking status: Never Smoker   . Smokeless tobacco: Never Used  . Alcohol Use: No  . Drug Use: No  . Sexual Activity: No   Other Topics Concern  . Not on file   Social History Narrative  . No narrative on file    Family History  Problem Relation Age of Onset  . Colon cancer    . Diabetes Brother   . Hypertension Mother   . COPD Father   . Diabetes Father     Allergies as of 11/17/2013 - Review Complete 11/17/2013  Allergen Reaction Noted  . Codeine Itching and Nausea And Vomiting 11/16/2011    Current Outpatient Prescriptions on File Prior to Visit  Medication Sig Dispense Refill  . albuterol (PROVENTIL) (2.5 MG/3ML) 0.083% nebulizer solution Take 2.5 mg by nebulization every 6 (six) hours as needed for wheezing. For wheezing      . amitriptyline (ELAVIL) 10 MG tablet Take 10 mg by mouth daily.      Marland Kitchen aspirin 325 MG tablet Take 325 mg by mouth daily.      Marland Kitchen atorvastatin (LIPITOR) 40  MG tablet Take 40 mg by mouth every morning.       . DULoxetine (CYMBALTA) 60 MG capsule Take 60 mg by mouth daily.      . furosemide (LASIX) 40 MG tablet Take 40 mg by mouth daily.      . insulin aspart (NOVOLOG) 100 UNIT/ML injection Inject 13-16 Units into the skin 4 (four) times daily -  before meals and at bedtime.       . insulin detemir (LEVEMIR) 100 UNIT/ML injection Inject 30-36 Units into the skin at bedtime.       Marland Kitchen loratadine (CLARITIN) 10 MG tablet Take 10 mg by mouth every morning.       Marland Kitchen losartan (COZAAR) 25 MG tablet Take 25 mg by mouth daily.      Marland Kitchen lubiprostone (AMITIZA) 24 MCG capsule Take 24 mcg by mouth daily with breakfast.      . oxyCODONE-acetaminophen (PERCOCET/ROXICET) 5-325 MG per tablet Take one tab every 4-6 hrs/ prn/ pain  30 tablet  0  . pantoprazole (PROTONIX) 40 MG tablet Take 40 mg by mouth every morning.      . potassium chloride (K-DUR) 10 MEQ tablet Take 10  mEq by mouth every morning.       No current facility-administered medications on file prior to visit.     REVIEW OF SYSTEMS: No changes from prior visit  PHYSICAL EXAMINATION:   Vital signs are BP 138/67  Pulse 62  Resp 16  Ht 5' (1.524 m)  Wt 189 lb 3.2 oz (85.821 kg)  BMI 36.95 kg/m2 General: The patient appears their stated age. HEENT:  No gross abnormalities Pulmonary:  Non labored breathing Musculoskeletal: There are no major deformities. Neurologic: No focal weakness or paresthesias are detected, Skin: There are no ulcer or rashes noted. Psychiatric: The patient has normal affect. Cardiovascular: Palpable thrill within the fistula.  Incisions are healed   Diagnostic Studies I ordered and reviewed her ultrasound.  This shows that the vein measures 0.25 near the anastomosis.  Dilate 0.71.  Depth is greater than 0.66 cm.  Assessment: Non-maturing right brachiocephalic fistula Plan: I discussed with the patient in the first step is going to be to dilate the narrowed section near the arteriovenous anastomosis.  This will require cannulation of the fistula in the mid upper arm.  She is not yet on dialysis for this will need to be done with minimal contrast.  This is been scheduled for this Wednesday.  I discussed the patient will likely need to have elevation of the fistula, however I would like to make sure that matures before doing so.  Eldridge Abrahams, M.D. [facility] Office: [phone] Pager:  [phone]

## 2013-11-18 ENCOUNTER — Emergency Department (HOSPITAL_COMMUNITY): Payer: Medicare Other

## 2013-11-18 ENCOUNTER — Emergency Department (HOSPITAL_COMMUNITY)
Admission: EM | Admit: 2013-11-18 | Discharge: 2013-11-18 | Disposition: A | Discharge status: Home / Self Care | Payer: Medicare Other | Attending: Emergency Medicine | Admitting: Emergency Medicine

## 2013-11-18 DIAGNOSIS — R748 Abnormal levels of other serum enzymes: Secondary | ICD-10-CM

## 2013-11-18 DIAGNOSIS — K5792 Diverticulitis of intestine, part unspecified, without perforation or abscess without bleeding: Secondary | ICD-10-CM

## 2013-11-18 LAB — CBC WITH DIFFERENTIAL/PLATELET
BASOS ABS: 0 10*3/uL (ref 0.0–0.1)
BASOS PCT: 0 % (ref 0–1)
EOS ABS: 0.3 10*3/uL (ref 0.0–0.7)
Eosinophils Relative: 2 % (ref 0–5)
HCT: 33.6 % — ABNORMAL LOW (ref 36.0–46.0)
Hemoglobin: 10.5 g/dL — ABNORMAL LOW (ref 12.0–15.0)
Lymphocytes Relative: 25 % (ref 12–46)
Lymphs Abs: 2.9 10*3/uL (ref 0.7–4.0)
MCH: 29.2 pg (ref 26.0–34.0)
MCHC: 31.3 g/dL (ref 30.0–36.0)
MCV: 93.6 fL (ref 78.0–100.0)
Monocytes Absolute: 0.7 10*3/uL (ref 0.1–1.0)
Monocytes Relative: 6 % (ref 3–12)
Neutro Abs: 7.9 10*3/uL — ABNORMAL HIGH (ref 1.7–7.7)
Neutrophils Relative %: 67 % (ref 43–77)
PLATELETS: 306 10*3/uL (ref 150–400)
RBC: 3.59 MIL/uL — ABNORMAL LOW (ref 3.87–5.11)
RDW: 14.6 % (ref 11.5–15.5)
WBC: 11.8 10*3/uL — ABNORMAL HIGH (ref 4.0–10.5)

## 2013-11-18 LAB — URINALYSIS, ROUTINE W REFLEX MICROSCOPIC
Bilirubin Urine: NEGATIVE
Glucose, UA: 100 mg/dL — AB
Ketones, ur: NEGATIVE mg/dL
NITRITE: NEGATIVE
PH: 5.5 (ref 5.0–8.0)
Protein, ur: 30 mg/dL — AB
SPECIFIC GRAVITY, URINE: 1.014 (ref 1.005–1.030)
Urobilinogen, UA: 0.2 mg/dL (ref 0.0–1.0)

## 2013-11-18 LAB — URINE MICROSCOPIC-ADD ON

## 2013-11-18 LAB — CG4 I-STAT (LACTIC ACID): LACTIC ACID, VENOUS: 0.47 mmol/L — AB (ref 0.5–2.2)

## 2013-11-18 MED ORDER — OXYCODONE-ACETAMINOPHEN 5-325 MG PO TABS
1.0000 | ORAL_TABLET | Freq: Once | ORAL | Status: AC
Start: 2013-11-18 — End: 2013-11-18
  Administered 2013-11-18: 1 via ORAL
  Filled 2013-11-18: qty 1

## 2013-11-18 MED ORDER — CIPROFLOXACIN HCL 500 MG PO TABS
500.0000 mg | ORAL_TABLET | Freq: Once | ORAL | Status: AC
  Administered 2013-11-18: 500 mg via ORAL
  Filled 2013-11-18: qty 1

## 2013-11-18 MED ORDER — OXYCODONE-ACETAMINOPHEN 5-325 MG PO TABS: 1.0000 | ORAL_TABLET | Freq: Four times a day (QID) | ORAL | Status: DC | PRN

## 2013-11-18 MED ORDER — CIPROFLOXACIN HCL 500 MG PO TABS: 500.0000 mg | ORAL_TABLET | Freq: Two times a day (BID) | ORAL | Status: DC

## 2013-11-18 MED ORDER — METRONIDAZOLE 500 MG PO TABS
500.0000 mg | ORAL_TABLET | Freq: Once | ORAL | Status: AC
  Administered 2013-11-18: 500 mg via ORAL
  Filled 2013-11-18: qty 1

## 2013-11-18 MED ORDER — MORPHINE SULFATE 4 MG/ML IJ SOLN
4.0000 mg | Freq: Once | INTRAMUSCULAR | Status: AC
  Administered 2013-11-18: 4 mg via INTRAVENOUS
  Filled 2013-11-18: qty 1

## 2013-11-18 MED ORDER — SODIUM CHLORIDE 0.9 % IV SOLN: INTRAVENOUS | Status: DC

## 2013-11-18 MED ORDER — IOHEXOL 300 MG/ML  SOLN
20.0000 mL | INTRAMUSCULAR | Status: AC
  Administered 2013-11-18 (×2): 20 mL via ORAL

## 2013-11-18 MED ORDER — ONDANSETRON HCL 4 MG/2ML IJ SOLN
4.0000 mg | Freq: Once | INTRAMUSCULAR | Status: AC
  Administered 2013-11-18: 4 mg via INTRAVENOUS
  Filled 2013-11-18: qty 2

## 2013-11-18 MED ORDER — METRONIDAZOLE 500 MG PO TABS: 500.0000 mg | ORAL_TABLET | Freq: Three times a day (TID) | ORAL | Status: DC

## 2013-11-18 NOTE — ED Notes (Signed)
Pt still drinking oral contrast 

## 2013-11-18 NOTE — ED Notes (Signed)
MD at bedside. 

## 2013-11-18 NOTE — ED Notes (Signed)
Discharge instructions reviewed with pt and pt's son Herbie Baltimore. No further questions at this time.

## 2013-11-18 NOTE — ED Notes (Signed)
Pt given ginger ale per MD orders for PO challenge.

## 2013-11-18 NOTE — ED Provider Notes (Signed)
CSN: [id number]     Arrival date & time 11/17/13  2256 History   First MD Initiated Contact with Patient 11/18/13 0012     Chief Complaint  Patient presents with  . Abdominal Pain     (Consider location/radiation/quality/duration/timing/severity/associated sxs/prior Treatment) HPI  This is a 68 yo female with history of hypertension, COPD, diabetes who presents with abdominal pain. Patient reports a 3 to four-day history of worsening left lower quadrant pain. She states it is sharp and nonradiating. She also reports left flank pain. She thought it was for constipation and took a laxative yesterday. She reports a runny bowel movement after the laxative. She's not had any bowel movements today. Currently she rates her pain a 10 out of 10. She's also had nonbilious, nonbloody emesis. History of hysterectomy and cholecystectomy. No known fevers.  Past Medical History  Diagnosis Date  . Hypertension   . COPD (chronic obstructive pulmonary disease)   . GERD (gastroesophageal reflux disease)   . Hypercholesteremia   . Anxiety   . Stroke   . Headaches due to old head injury   . Blind one eye   . Angina   . Shortness of breath   . Pneumonia   . Recurrent upper respiratory infection (URI)   . Anemia   . Arthritis   . Complication of anesthesia     difficulty waking up  . Chronic kidney disease   . Cancer     skin cancer right thumb  . Temporal arteritis 11/17/2011  . Gastric polyp     antral  . Stenosis of esophagus   . Diabetes mellitus     TYpe 2 IDDM x 11 years   Past Surgical History  Procedure Laterality Date  . Abdominal hysterectomy    . Cholecystectomy    . Heel spur surgery      bilateral  . Esophagogastroduodenoscopy  11/18/2011    Procedure: ESOPHAGOGASTRODUODENOSCOPY (EGD);  Surgeon: Gatha Mayer, MD;  Location: [facility];  Service: Endoscopy;  Laterality: N/A;  . Eye surgery      cataracts/IOL  . Brain surgery      temporal arteritis  . Av fistula placement  Right 08/14/2013    Procedure: ARTERIOVENOUS (AV) FISTULA CREATION;  Surgeon: Serafina Mitchell, MD;  Location: [place];  Service: Vascular;  Laterality: Right;  . Av fistula placement Right 08/14/2013    Procedure:  CREATION BRACHIOCEPHALIC ARTERIOVENOUS (AV) FISTULA;  Surgeon: Serafina Mitchell, MD;  Location: [facility];  Service: Vascular;  Laterality: Right;   Family History  Problem Relation Age of Onset  . Colon cancer    . Diabetes Brother   . Hypertension Mother   . COPD Father   . Diabetes Father    History  Substance Use Topics  . Smoking status: Never Smoker   . Smokeless tobacco: Never Used  . Alcohol Use: No   OB History   Grav Para Term Preterm Abortions TAB SAB Ect Mult Living                 Review of Systems  Constitutional: Negative for fever.  Respiratory: Negative for cough, chest tightness and shortness of breath.   Cardiovascular: Negative for chest pain.  Gastrointestinal: Positive for nausea, vomiting, abdominal pain and constipation. Negative for diarrhea and blood in stool.  Genitourinary: Positive for flank pain. Negative for dysuria and hematuria.  Musculoskeletal: Negative for back pain.  Skin: Negative for rash.  Neurological: Negative for headaches.  Psychiatric/Behavioral: Negative for confusion.  All other systems reviewed and are negative.     Allergies  Codeine  Home Medications   Prior to Admission medications   Medication Sig Start Date End Date Taking? Authorizing Provider  albuterol (PROVENTIL) (2.5 MG/3ML) 0.083% nebulizer solution Take 2.5 mg by nebulization every 6 (six) hours as needed for wheezing. For wheezing   Yes Historical Provider, MD  amitriptyline (ELAVIL) 10 MG tablet Take 10 mg by mouth daily.   Yes Historical Provider, MD  aspirin 325 MG tablet Take 325 mg by mouth daily.   Yes Historical Provider, MD  atorvastatin (LIPITOR) 40 MG tablet Take 40 mg by mouth every morning.    Yes Historical Provider, MD  DULoxetine (CYMBALTA)  60 MG capsule Take 60 mg by mouth daily.   Yes Historical Provider, MD  furosemide (LASIX) 40 MG tablet Take 40 mg by mouth daily.   Yes Historical Provider, MD  insulin aspart (NOVOLOG) 100 UNIT/ML injection Inject 13-16 Units into the skin 4 (four) times daily -  before meals and at bedtime.    Yes Historical Provider, MD  insulin detemir (LEVEMIR) 100 UNIT/ML injection Inject 30-36 Units into the skin at bedtime.    Yes Historical Provider, MD  loratadine (CLARITIN) 10 MG tablet Take 10 mg by mouth every morning.    Yes Historical Provider, MD  losartan (COZAAR) 25 MG tablet Take 25 mg by mouth daily.   Yes Historical Provider, MD  lubiprostone (AMITIZA) 24 MCG capsule Take 24 mcg by mouth daily with breakfast.   Yes Historical Provider, MD  oxyCODONE-acetaminophen (PERCOCET/ROXICET) 5-325 MG per tablet Take one tab every 4-6 hrs/ prn/ pain 08/26/13  Yes Mal Misty, MD  pantoprazole (PROTONIX) 40 MG tablet Take 40 mg by mouth every morning.   Yes Historical Provider, MD  potassium chloride (K-DUR) 10 MEQ tablet Take 10 mEq by mouth every morning.   Yes Historical Provider, MD   BP 142/85  Pulse 73  Temp(Src) 98.1 F (36.7 C) (Oral)  Resp 17  Ht 5' (1.524 m)  Wt 189 lb (85.73 kg)  BMI 36.91 kg/m2  SpO2 91% Physical Exam  Nursing note and vitals reviewed. Constitutional: She is oriented to person, place, and time. She appears well-developed and well-nourished. No distress.  HENT:  Head: Normocephalic.  Ovoid scar over forehead  Eyes: Pupils are equal, round, and reactive to light.  Cardiovascular: Normal rate, regular rhythm and normal heart sounds.   No murmur heard. Pulmonary/Chest: Effort normal and breath sounds normal. No respiratory distress. She has no wheezes.  Abdominal: Soft. Bowel sounds are normal. She exhibits no distension. There is tenderness. There is no rebound and no guarding.  Left lower quadrant tenderness to palpation without rebound or guarding   Musculoskeletal: She exhibits no edema.  Neurological: She is alert and oriented to person, place, and time.  Skin: Skin is warm and dry.  Psychiatric: She has a normal mood and affect.    ED Course  Procedures (including critical care time)  Angiocath insertion Performed by: Merryl Hacker  Consent: Verbal consent obtained. Risks and benefits: risks, benefits and alternatives were discussed Time out: Immediately prior to procedure a "time out" was called to verify the correct patient, procedure, equipment, support staff and site/side marked as required.  Preparation: Patient was prepped and draped in the usual sterile fashion.  Vein Location: left brachial  Ultrasound Guided  Gauge: 20  Normal blood return and flush without difficulty Patient tolerance: Patient tolerated the procedure well with no  immediate complications.     Labs Review Labs Reviewed  CBC WITH DIFFERENTIAL - Abnormal; Notable for the following:    WBC 11.8 (*)    RBC 3.59 (*)    Hemoglobin 10.5 (*)    HCT 33.6 (*)    Neutro Abs 7.9 (*)    All other components within normal limits  COMPREHENSIVE METABOLIC PANEL - Abnormal; Notable for the following:    Glucose, Bld 134 (*)    BUN 32 (*)    Creatinine, Ser 3.73 (*)    Albumin 3.3 (*)    Alkaline Phosphatase 133 (*)    GFR calc non Af Amer 11 (*)    GFR calc Af Amer 13 (*)    All other components within normal limits  URINALYSIS, ROUTINE W REFLEX MICROSCOPIC - Abnormal; Notable for the following:    Glucose, UA 100 (*)    Hgb urine dipstick TRACE (*)    Protein, ur 30 (*)    Leukocytes, UA TRACE (*)    All other components within normal limits  URINE MICROSCOPIC-ADD ON - Abnormal; Notable for the following:    Squamous Epithelial / LPF FEW (*)    Bacteria, UA FEW (*)    Casts HYALINE CASTS (*)    All other components within normal limits  LIPASE, BLOOD  I-STAT CG4 LACTIC ACID, ED    Imaging Review Ct Abdomen Pelvis Wo  Contrast  11/18/2013   CLINICAL DATA:  Left lower quadrant pain for the past 4 days. Nausea and vomiting.  EXAM: CT ABDOMEN AND PELVIS WITHOUT CONTRAST  TECHNIQUE: Multidetector CT imaging of the abdomen and pelvis was performed following the standard protocol without IV contrast.  COMPARISON:  CT of the abdomen and pelvis 11/17/2011.  FINDINGS: Lung Bases: Mitral annular calcifications. Calcifications of the aortic valve. Mild scarring in the lung bases bilaterally.  Abdomen/Pelvis: Numerous colonic diverticuli are noted, most pronounced in the region of the sigmoid colon. In the proximal the mid sigmoid colon there is also extensive colonic wall thickening with extensive inflammatory changes in the associated sigmoid mesocolon, compatible with acute diverticulitis. No definite diverticular abscess or signs to suggest frank perforation are noted at this time.  No abnormal calcifications are identified within the collecting system of either kidney, along the course of either ureter, or within the lumen of the urinary bladder. No hydroureteronephrosis or perinephric stranding to suggest urinary tract obstruction at this time. Mild left renal atrophy. The appearance of the kidneys is otherwise unremarkable bilaterally.  Status post cholecystectomy. The unenhanced appearance of the liver, pancreas, spleen and bilateral adrenal glands is unremarkable. Atherosclerosis throughout the abdominal and pelvic vasculature, without evidence of aneurysm. No significant volume of ascites. No pneumoperitoneum. No pathologic distention of small bowel. No definite lymphadenopathy identified within the abdomen or pelvis on today's non contrast CT examination. Normal appendix. Status post hysterectomy. Ovaries are not confidently identified may be surgically absent or atrophic. Urinary bladder is unremarkable in appearance.  Musculoskeletal: There are no aggressive appearing lytic or blastic lesions noted in the visualized portions of  the skeleton.  IMPRESSION: 1. Acute diverticulitis of the proximal the mid sigmoid colon. No definite diverticular abscess or signs of frank perforation are identified at this time. 2. Normal appendix. 3. No urinary tract calculi or findings of urinary tract obstruction at this time. 4. Status post cholecystectomy. 5. Atherosclerosis.   Electronically Signed   By: Vinnie Langton M.D.   On: 11/18/2013 03:37     EKG Interpretation None  MDM   Final diagnoses:  Diverticulitis  Increased creatine kinase level    The patient presents with left lower quadrant pain. Patient does have tenderness to palpation. Vital signs are reassuring. Patient was given pain medications and fluids. Labwork notable for elevated creatinine to 3.73. Baseline around 2.8.  Patient is making urine is not currently on dialysis. CT scan obtained to evaluate for diverticulitis. Diverticulosis noted on CT scan. Patient continues to be afebrile. Discussed with patient admission for antibiotics and fluid resuscitation and repeat kidney function in the morning. Patient prefers trialing outpatient management. I feel this is reasonable given patient's reassuring lab work and exam. Patient was able to tolerate by mouth. Patient was given one dose of Cipro and Flagyl prior to discharge. She is to followup with her primary care physician for repeat creatinine in 1-2 days and was given strict return precautions.  After history, exam, and medical workup I feel the patient has been appropriately medically screened and is safe for discharge home. Pertinent diagnoses were discussed with the patient. Patient was given return precautions.     Merryl Hacker, MD 11/18/13 [phone]

## 2013-11-18 NOTE — ED Notes (Signed)
Lactic acid results did not populate into EPIC, Lactic Acid result: 0.47

## 2013-11-18 NOTE — Discharge Instructions (Signed)
Diverticulitis °A diverticulum is a small pouch or sac on the colon. Diverticulosis is the presence of these diverticula on the colon. Diverticulitis is the irritation (inflammation) or infection of diverticula. °CAUSES  °The colon and its diverticula contain bacteria. If food particles block the tiny opening to a diverticulum, the bacteria inside can grow and cause an increase in pressure. This leads to infection and inflammation and is called diverticulitis. °SYMPTOMS  °· Abdominal pain and tenderness. Usually, the pain is located on the left side of your abdomen. However, it could be located elsewhere. °· Fever. °· Bloating. °· Feeling sick to your stomach (nausea). °· Throwing up (vomiting). °· Abnormal stools. °DIAGNOSIS  °Your caregiver will take a history and perform a physical exam. Since many things can cause abdominal pain, other tests may be necessary. Tests may include: °· Blood tests. °· Urine tests. °· X-ray of the abdomen. °· CT scan of the abdomen. °Sometimes, surgery is needed to determine if diverticulitis or other conditions are causing your symptoms. °TREATMENT  °Most of the time, you can be treated without surgery. Treatment includes: °· Resting the bowels by only having liquids for a few days. As you improve, you will need to eat a low-fiber diet. °· Intravenous (IV) fluids if you are losing body fluids (dehydrated). °· Antibiotic medicines that treat infections may be given. °· Pain and nausea medicine, if needed. °· Surgery if the inflamed diverticulum has burst. °HOME CARE INSTRUCTIONS  °· Try a clear liquid diet (broth, tea, or water for as long as directed by your caregiver). You may then gradually begin a low-fiber diet as tolerated.  °A low-fiber diet is a diet with less than 10 grams of fiber. Choose the foods below to reduce fiber in the diet: °· White breads, cereals, rice, and pasta. °· Cooked fruits and vegetables or soft fresh fruits and vegetables without the skin. °· Ground or  well-cooked tender beef, ham, veal, lamb, pork, or poultry. °· Eggs and seafood. °· After your diverticulitis symptoms have improved, your caregiver may put you on a high-fiber diet. A high-fiber diet includes 14 grams of fiber for every 1000 calories consumed. For a standard 2000 calorie diet, you would need 28 grams of fiber. Follow these diet guidelines to help you increase the fiber in your diet. It is important to slowly increase the amount fiber in your diet to avoid gas, constipation, and bloating. °· Choose whole-grain breads, cereals, pasta, and brown rice. °· Choose fresh fruits and vegetables with the skin on. Do not overcook vegetables because the more vegetables are cooked, the more fiber is lost. °· Choose more nuts, seeds, legumes, dried peas, beans, and lentils. °· Look for food products that have greater than 3 grams of fiber per serving on the Nutrition Facts label. °· Take all medicine as directed by your caregiver. °· If your caregiver has given you a follow-up appointment, it is very important that you go. Not going could result in lasting (chronic) or permanent injury, pain, and disability. If there is any problem keeping the appointment, call to reschedule. °SEEK MEDICAL CARE IF:  °· Your pain does not improve. °· You have a hard time advancing your diet beyond clear liquids. °· Your bowel movements do not return to normal. °SEEK IMMEDIATE MEDICAL CARE IF:  °· Your pain becomes worse. °· You have an oral temperature above 102° F (38.9° C), not controlled by medicine. °· You have repeated vomiting. °· You have bloody or black, tarry stools. °·   Symptoms that brought you to your caregiver become worse or are not getting better. °MAKE SURE YOU:  °· Understand these instructions. °· Will watch your condition. °· Will get help right away if you are not doing well or get worse. °Document Released: 03/29/2005 Document Revised: 09/11/2011 Document Reviewed: 07/25/2010 °[employer]® Patient Information  ©2014 [employer]. ° °

## 2013-11-19 ENCOUNTER — Other Ambulatory Visit: Payer: Self-pay | Admitting: *Deleted

## 2013-11-19 ENCOUNTER — Telehealth: Payer: Self-pay | Admitting: Surgery

## 2013-11-19 ENCOUNTER — Ambulatory Visit (HOSPITAL_COMMUNITY)
Admission: RE | Admit: 2013-11-19 | Discharge: 2013-11-19 | Disposition: A | Discharge status: Home / Self Care | Payer: Medicare Other | Source: Ambulatory Visit | Attending: Surgery | Admitting: Surgery

## 2013-11-19 ENCOUNTER — Encounter (HOSPITAL_COMMUNITY)
Admission: RE | Disposition: A | Discharge status: Home / Self Care | Payer: Self-pay | Source: Ambulatory Visit | Attending: Surgery

## 2013-11-19 DIAGNOSIS — T82898A Other specified complication of vascular prosthetic devices, implants and grafts, initial encounter: Secondary | ICD-10-CM

## 2013-11-19 DIAGNOSIS — J4489 Other specified chronic obstructive pulmonary disease: Secondary | ICD-10-CM | POA: Insufficient documentation

## 2013-11-19 DIAGNOSIS — I871 Compression of vein: Secondary | ICD-10-CM | POA: Insufficient documentation

## 2013-11-19 DIAGNOSIS — F411 Generalized anxiety disorder: Secondary | ICD-10-CM | POA: Insufficient documentation

## 2013-11-19 DIAGNOSIS — N189 Chronic kidney disease, unspecified: Secondary | ICD-10-CM | POA: Insufficient documentation

## 2013-11-19 DIAGNOSIS — E7800 Pure hypercholesterolemia, unspecified: Secondary | ICD-10-CM | POA: Insufficient documentation

## 2013-11-19 DIAGNOSIS — Y832 Surgical operation with anastomosis, bypass or graft as the cause of abnormal reaction of the patient, or of later complication, without mention of misadventure at the time of the procedure: Secondary | ICD-10-CM | POA: Insufficient documentation

## 2013-11-19 DIAGNOSIS — Z7982 Long term (current) use of aspirin: Secondary | ICD-10-CM | POA: Insufficient documentation

## 2013-11-19 DIAGNOSIS — I129 Hypertensive chronic kidney disease with stage 1 through stage 4 chronic kidney disease, or unspecified chronic kidney disease: Secondary | ICD-10-CM | POA: Insufficient documentation

## 2013-11-19 DIAGNOSIS — E119 Type 2 diabetes mellitus without complications: Secondary | ICD-10-CM | POA: Insufficient documentation

## 2013-11-19 DIAGNOSIS — Z4931 Encounter for adequacy testing for hemodialysis: Secondary | ICD-10-CM

## 2013-11-19 DIAGNOSIS — N186 End stage renal disease: Secondary | ICD-10-CM

## 2013-11-19 DIAGNOSIS — Z8673 Personal history of transient ischemic attack (TIA), and cerebral infarction without residual deficits: Secondary | ICD-10-CM | POA: Insufficient documentation

## 2013-11-19 DIAGNOSIS — J449 Chronic obstructive pulmonary disease, unspecified: Secondary | ICD-10-CM | POA: Insufficient documentation

## 2013-11-19 DIAGNOSIS — Z794 Long term (current) use of insulin: Secondary | ICD-10-CM | POA: Insufficient documentation

## 2013-11-19 DIAGNOSIS — K219 Gastro-esophageal reflux disease without esophagitis: Secondary | ICD-10-CM | POA: Insufficient documentation

## 2013-11-19 HISTORY — PX: SHUNTOGRAM: SHX5491

## 2013-11-19 LAB — POCT I-STAT, CHEM 8
BUN: 35 mg/dL — ABNORMAL HIGH (ref 6–23)
CALCIUM ION: 1.22 mmol/L (ref 1.13–1.30)
Chloride: 106 mEq/L (ref 96–112)
Creatinine, Ser: 3.8 mg/dL — ABNORMAL HIGH (ref 0.50–1.10)
Glucose, Bld: 130 mg/dL — ABNORMAL HIGH (ref 70–99)
HEMATOCRIT: 35 % — AB (ref 36.0–46.0)
HEMOGLOBIN: 11.9 g/dL — AB (ref 12.0–15.0)
POTASSIUM: 4.2 meq/L (ref 3.7–5.3)
Sodium: 143 mEq/L (ref 137–147)
TCO2: 22 mmol/L (ref 0–100)

## 2013-11-19 LAB — POCT ACTIVATED CLOTTING TIME: Activated Clotting Time: 160 seconds

## 2013-11-19 LAB — GLUCOSE, CAPILLARY
GLUCOSE-CAPILLARY: 119 mg/dL — AB (ref 70–99)
Glucose-Capillary: 118 mg/dL — ABNORMAL HIGH (ref 70–99)

## 2013-11-19 SURGERY — ASSESSMENT, SHUNT FUNCTION, WITH CONTRAST RADIOGRAPHIC STUDY
Anesthesia: LOCAL | Laterality: Right

## 2013-11-19 MED ORDER — PHENOL 1.4 % MT LIQD: 1.0000 | OROMUCOSAL | Status: DC | PRN

## 2013-11-19 MED ORDER — LIDOCAINE HCL (PF) 1 % IJ SOLN
INTRAMUSCULAR | Status: AC
  Filled 2013-11-19: qty 30

## 2013-11-19 MED ORDER — GUAIFENESIN-DM 100-10 MG/5ML PO SYRP: 15.0000 mL | ORAL_SOLUTION | ORAL | Status: DC | PRN

## 2013-11-19 MED ORDER — ONDANSETRON HCL 4 MG/2ML IJ SOLN: 4.0000 mg | Freq: Four times a day (QID) | INTRAMUSCULAR | Status: DC | PRN

## 2013-11-19 MED ORDER — FENTANYL CITRATE 0.05 MG/ML IJ SOLN
INTRAMUSCULAR | Status: AC
  Filled 2013-11-19: qty 2

## 2013-11-19 MED ORDER — ALUM & MAG HYDROXIDE-SIMETH 200-200-20 MG/5ML PO SUSP: 15.0000 mL | ORAL | Status: DC | PRN

## 2013-11-19 MED ORDER — METOPROLOL TARTRATE 1 MG/ML IV SOLN: 2.0000 mg | INTRAVENOUS | Status: DC | PRN

## 2013-11-19 MED ORDER — HEPARIN (PORCINE) IN NACL 2-0.9 UNIT/ML-% IJ SOLN
INTRAMUSCULAR | Status: AC
  Filled 2013-11-19: qty 500

## 2013-11-19 MED ORDER — LABETALOL HCL 5 MG/ML IV SOLN: 10.0000 mg | INTRAVENOUS | Status: DC | PRN

## 2013-11-19 MED ORDER — MIDAZOLAM HCL 2 MG/2ML IJ SOLN
INTRAMUSCULAR | Status: AC
  Filled 2013-11-19: qty 2

## 2013-11-19 MED ORDER — HEPARIN SODIUM (PORCINE) 1000 UNIT/ML IJ SOLN
INTRAMUSCULAR | Status: AC
  Filled 2013-11-19: qty 1

## 2013-11-19 MED ORDER — HYDRALAZINE HCL 20 MG/ML IJ SOLN: 10.0000 mg | INTRAMUSCULAR | Status: DC | PRN

## 2013-11-19 NOTE — Op Note (Signed)
    Patient name: Savannah Lindsey MRN: [id number] DOB: [birth year] Sex: female  11/19/2013 Pre-operative Diagnosis: Non-maturing right upper arm fistula Post-operative diagnosis:  Same Surgeon:  Serafina Mitchell Procedure Performed:  1.  ultrasound-guided access, right cephalic vein  2.  fistulogram  3.  angioplasty, right cephalic vein   Indications:  The patient is status post right upper arm fistula.  Postoperative ultrasound revealed a narrowing at the arteriovenous anastomosis.  She comes in today for further evaluation and possible treatment.  She is not yet on dialysis.  Her creatinine was 3.7.  Procedure:  The patient was identified in the holding area and taken to room 8.  The patient was then placed supine on the table and prepped and draped in the usual sterile fashion.  A time out was called.  Ultrasound was used to evaluate the fistula.  The vein was patent and compressible.  A digital ultrasound image was acquired.  The fistula was then accessed under ultrasound guidance using a micropuncture needle.  An 018 wire was then asvanced without resistance and a micropuncture sheath was placed.  Contrast injections were then performed through the sheath.  Findings:  The cephalic vein is widely patent in the upper arm.  There are several branches noted.  There does appear to be luminal narrowing within the fistula just beyond the arteriovenous anastomosis.   Intervention:  After the above images were acquired, I placed a 5 French sheath, as the images were acquired with a KM P. catheter.  The patient was given 3000 units of heparin.  I selected a 5 x 40 Mustang balloon.  This was inserted down to the arteriovenous anastomosis.  I then advanced a 014 [place] core wire into the radial artery.  I proceeded with balloon angioplasty taking the 5 mm balloon up to rated  pressure.  The balloon was held for 90 seconds.  Followup imaging revealed improved results across this area.  I did not want to up  size the balloon as there was a small tear within the vein.  At this point, the balloon and wire were removed.  The patient taken the holding area for sheath pull.  There were no complications.  Impression:  #1  patent fistula within the right upper arm.  #2  narrowing within the proximal portion of the fistula, near the arteriovenous anastomosis.  This was successfully dilated using a 5 mm balloon.  #3  a total of 4 cc of contrast were utilized  #4  the patient will followup in 6 weeks with a repeat ultrasound.  She will likely need to have elevation of her fistula prior to accessing it    V. Annamarie Major, M.D. [facility] Office: [phone] Pager:  [phone]

## 2013-11-19 NOTE — Interval H&P Note (Signed)
History and Physical Interval Note:  11/19/2013 12:36 PM  Savannah Lindsey  has presented today for surgery, with the diagnosis of END STAGE RENAL  The various methods of treatment have been discussed with the patient and family. After consideration of risks, benefits and other options for treatment, the patient has consented to  Procedure(s): FISTULOGRAM (Right) as a surgical intervention .  The patient's history has been reviewed, patient examined, no change in status, stable for surgery.  I have reviewed the patient's chart and labs.  Questions were answered to the patient's satisfaction.     Serafina Mitchell

## 2013-11-19 NOTE — H&P (View-Only) (Signed)
Patient name: Savannah Lindsey MRN: [id number] DOB: [birth year] Sex: female     Chief Complaint  Patient presents with  . Follow-up    s/p right brachiocephalic fistula placement  08-14-2013       HISTORY OF PRESENT ILLNESS: The patient is back today for dialysis access evaluation.  On 08/14/2013 she underwent right brachiocephalic fistula.  She reports no symptoms of steal.  Past Medical History  Diagnosis Date  . Hypertension   . COPD (chronic obstructive pulmonary disease)   . GERD (gastroesophageal reflux disease)   . Hypercholesteremia   . Anxiety   . Stroke   . Headaches due to old head injury   . Blind one eye   . Angina   . Shortness of breath   . Pneumonia   . Recurrent upper respiratory infection (URI)   . Anemia   . Arthritis   . Complication of anesthesia     difficulty waking up  . Chronic kidney disease   . Cancer     skin cancer right thumb  . Temporal arteritis 11/17/2011  . Gastric polyp     antral  . Stenosis of esophagus   . Diabetes mellitus     TYpe 2 IDDM x 11 years    Past Surgical History  Procedure Laterality Date  . Abdominal hysterectomy    . Cholecystectomy    . Heel spur surgery      bilateral  . Esophagogastroduodenoscopy  11/18/2011    Procedure: ESOPHAGOGASTRODUODENOSCOPY (EGD);  Surgeon: Gatha Mayer, MD;  [facility] END[facility];  Service: Endoscopy;  Laterality: N/A;  . Eye surgery      cataracts/IOL  . Brain surgery      temporal arteritis  . Av fistula placement Right 08/14/2013    Procedure: ARTERIOVENOUS (AV) FISTULA CREATION;  Surgeon: Serafina Mitchell, MD;  Location: [place];  Service: Vascular;  Laterality: Right;  . Av fistula placement Right 08/14/2013    Procedure:  CREATION BRACHIOCEPHALIC ARTERIOVENOUS (AV) FISTULA;  Surgeon: Serafina Mitchell, MD;  Location: [place];  Service: Vascular;  Laterality: Right;    History   Social History  . Marital Status: Married    Spouse Name: N/A    Number of Children: N/A  .  Years of Education: N/A   Occupational History  . Not on file.   Social History Main Topics  . Smoking status: Never Smoker   . Smokeless tobacco: Never Used  . Alcohol Use: No  . Drug Use: No  . Sexual Activity: No   Other Topics Concern  . Not on file   Social History Narrative  . No narrative on file    Family History  Problem Relation Age of Onset  . Colon cancer    . Diabetes Brother   . Hypertension Mother   . COPD Father   . Diabetes Father     Allergies as of 11/17/2013 - Review Complete 11/17/2013  Allergen Reaction Noted  . Codeine Itching and Nausea And Vomiting 11/16/2011    Current Outpatient Prescriptions on File Prior to Visit  Medication Sig Dispense Refill  . albuterol (PROVENTIL) (2.5 MG/3ML) 0.083% nebulizer solution Take 2.5 mg by nebulization every 6 (six) hours as needed for wheezing. For wheezing      . amitriptyline (ELAVIL) 10 MG tablet Take 10 mg by mouth daily.      Marland Kitchen aspirin 325 MG tablet Take 325 mg by mouth daily.      Marland Kitchen atorvastatin (LIPITOR) 40  MG tablet Take 40 mg by mouth every morning.       . DULoxetine (CYMBALTA) 60 MG capsule Take 60 mg by mouth daily.      . furosemide (LASIX) 40 MG tablet Take 40 mg by mouth daily.      . insulin aspart (NOVOLOG) 100 UNIT/ML injection Inject 13-16 Units into the skin 4 (four) times daily -  before meals and at bedtime.       . insulin detemir (LEVEMIR) 100 UNIT/ML injection Inject 30-36 Units into the skin at bedtime.       Marland Kitchen loratadine (CLARITIN) 10 MG tablet Take 10 mg by mouth every morning.       Marland Kitchen losartan (COZAAR) 25 MG tablet Take 25 mg by mouth daily.      Marland Kitchen lubiprostone (AMITIZA) 24 MCG capsule Take 24 mcg by mouth daily with breakfast.      . oxyCODONE-acetaminophen (PERCOCET/ROXICET) 5-325 MG per tablet Take one tab every 4-6 hrs/ prn/ pain  30 tablet  0  . pantoprazole (PROTONIX) 40 MG tablet Take 40 mg by mouth every morning.      . potassium chloride (K-DUR) 10 MEQ tablet Take 10  mEq by mouth every morning.       No current facility-administered medications on file prior to visit.     REVIEW OF SYSTEMS: No changes from prior visit  PHYSICAL EXAMINATION:   Vital signs are BP 138/67  Pulse 62  Resp 16  Ht 5' (1.524 m)  Wt 189 lb 3.2 oz (85.821 kg)  BMI 36.95 kg/m2 General: The patient appears their stated age. HEENT:  No gross abnormalities Pulmonary:  Non labored breathing Musculoskeletal: There are no major deformities. Neurologic: No focal weakness or paresthesias are detected, Skin: There are no ulcer or rashes noted. Psychiatric: The patient has normal affect. Cardiovascular: Palpable thrill within the fistula.  Incisions are healed   Diagnostic Studies I ordered and reviewed her ultrasound.  This shows that the vein measures 0.25 near the anastomosis.  Dilate 0.71.  Depth is greater than 0.66 cm.  Assessment: Non-maturing right brachiocephalic fistula Plan: I discussed with the patient in the first step is going to be to dilate the narrowed section near the arteriovenous anastomosis.  This will require cannulation of the fistula in the mid upper arm.  She is not yet on dialysis for this will need to be done with minimal contrast.  This is been scheduled for this Wednesday.  I discussed the patient will likely need to have elevation of the fistula, however I would like to make sure that matures before doing so.  Eldridge Abrahams, M.D. [facility] Office: [phone] Pager:  [phone]

## 2013-11-19 NOTE — Discharge Instructions (Signed)
AV Fistula °Care After °Refer to this sheet in the next few weeks. These instructions provide you with information on caring for yourself after your procedure. Your caregiver may also give you more specific instructions. Your treatment has been planned according to current medical practices, but problems sometimes occur. Call your caregiver if you have any problems or questions after your procedure. °HOME CARE INSTRUCTIONS  °· Do not drive a car or take public transportation alone. °· Do not drink alcohol. °· Only take medicine that has been prescribed by your caregiver. °· Do not sign important papers or make important decisions. °· Have a responsible person with you. °· Ask your caregiver to show you how to check your access at home for a vibration (called a "thrill") or for a sound (called a "bruit" pronounced brew-ee). °· Your vein will need time to enlarge and mature so needles can be inserted for dialysis. Follow your caregiver's instructions about what you need to do to make this happen. °· Keep dressings clean and dry. °· Keep the arm elevated above your heart. Use a pillow. °· Rest. °· Use the arm as usual for all activities. °· Have the stitches or tape closures removed in 10 to 14 days, or as directed by your caregiver. °· Do not sleep or lie on the area of the fistula or that arm. This may decrease or stop the blood flow through your fistula. °· Do not allow blood pressures to be taken on this arm. °· Do not allow blood drawing to be done from the graft. °· Do not wear tight clothing around the access site or on the arm. °· Avoid lifting heavy objects with the arm that has the fistula. °· Do not use creams or lotions over the access site. °SEEK MEDICAL CARE IF:  °· You have a fever. °· You have swelling around the fistula that gets worse, or you have new pain. °· You have unusual bleeding at the fistula site or from any other area. °· You have pus or other drainage at the fistula site. °· You have skin  redness or red streaking on the skin around, above, or below the fistula site. °· Your access site feels warm. °· You have any flu-like symptoms. °SEEK IMMEDIATE MEDICAL CARE IF:  °· You have pain, numbness, or an unusual pale skin on the hand or on the side of your fistula. °· You have dizziness or weakness that you have not had before. °· You have shortness of breath. °· You have chest pain. °· Your fistula disconnects or breaks, and there is bleeding that cannot be easily controlled. °Call for local emergency medical help. Do not try to drive yourself to the hospital. °MAKE SURE YOU °· Understand these instructions. °· Will watch your condition. °· Will get help right away if you are not doing well or get worse. °Document Released: 06/19/2005 Document Revised: 09/11/2011 Document Reviewed: 12/07/2010 °[employer]® Patient Information ©2014 [employer]. ° °

## 2013-11-19 NOTE — Telephone Encounter (Addendum)
Message copied by Gena Fray on Wed Nov 19, 2013  3:43 PM ------      Message from: Peter Minium K      Created: Wed Nov 19, 2013  2:44 PM      Regarding: Schedule                   ----- Message -----         From: Serafina Mitchell, MD         Sent: 11/19/2013   1:31 PM           To: [facility] Charge Pool            11/19/2013:            Surgeon:  Serafina Mitchell      Procedure Performed:       1.  ultrasound-guided access, right cephalic vein       2.  fistulogram       3.  angioplasty, right cephalic vein                  Followup in 6 weeks with a fistula duplex ------  11/19/13: phone # was disconnected, mailed letter, dpm

## 2013-11-28 ENCOUNTER — Emergency Department (HOSPITAL_COMMUNITY): Payer: Medicare Other

## 2013-11-28 ENCOUNTER — Encounter (HOSPITAL_COMMUNITY)
Admission: RE | Admit: 2013-11-28 | Discharge: 2013-11-28 | Disposition: A | Discharge status: Home / Self Care | Payer: Medicare Other | Source: Ambulatory Visit | Attending: Nephrology | Admitting: Nephrology

## 2013-11-28 ENCOUNTER — Encounter (HOSPITAL_COMMUNITY): Payer: Self-pay | Admitting: Emergency Medicine

## 2013-11-28 ENCOUNTER — Emergency Department (HOSPITAL_COMMUNITY)
Admission: EM | Admit: 2013-11-28 | Discharge: 2013-11-28 | Disposition: A | Discharge status: Home / Self Care | Payer: Medicare Other | Attending: Emergency Medicine | Admitting: Emergency Medicine

## 2013-11-28 DIAGNOSIS — E7800 Pure hypercholesterolemia, unspecified: Secondary | ICD-10-CM | POA: Insufficient documentation

## 2013-11-28 DIAGNOSIS — Z862 Personal history of diseases of the blood and blood-forming organs and certain disorders involving the immune mechanism: Secondary | ICD-10-CM | POA: Insufficient documentation

## 2013-11-28 DIAGNOSIS — Z792 Long term (current) use of antibiotics: Secondary | ICD-10-CM | POA: Insufficient documentation

## 2013-11-28 DIAGNOSIS — Z79899 Other long term (current) drug therapy: Secondary | ICD-10-CM | POA: Insufficient documentation

## 2013-11-28 DIAGNOSIS — I120 Hypertensive chronic kidney disease with stage 5 chronic kidney disease or end stage renal disease: Secondary | ICD-10-CM | POA: Insufficient documentation

## 2013-11-28 DIAGNOSIS — Z7982 Long term (current) use of aspirin: Secondary | ICD-10-CM | POA: Insufficient documentation

## 2013-11-28 DIAGNOSIS — K219 Gastro-esophageal reflux disease without esophagitis: Secondary | ICD-10-CM | POA: Insufficient documentation

## 2013-11-28 DIAGNOSIS — Z8701 Personal history of pneumonia (recurrent): Secondary | ICD-10-CM | POA: Insufficient documentation

## 2013-11-28 DIAGNOSIS — H5440 Blindness, one eye, unspecified eye: Secondary | ICD-10-CM | POA: Insufficient documentation

## 2013-11-28 DIAGNOSIS — Z85828 Personal history of other malignant neoplasm of skin: Secondary | ICD-10-CM | POA: Insufficient documentation

## 2013-11-28 DIAGNOSIS — R5383 Other fatigue: Secondary | ICD-10-CM

## 2013-11-28 DIAGNOSIS — N186 End stage renal disease: Secondary | ICD-10-CM | POA: Insufficient documentation

## 2013-11-28 DIAGNOSIS — M25559 Pain in unspecified hip: Secondary | ICD-10-CM | POA: Insufficient documentation

## 2013-11-28 DIAGNOSIS — Z87828 Personal history of other (healed) physical injury and trauma: Secondary | ICD-10-CM | POA: Insufficient documentation

## 2013-11-28 DIAGNOSIS — E119 Type 2 diabetes mellitus without complications: Secondary | ICD-10-CM | POA: Insufficient documentation

## 2013-11-28 DIAGNOSIS — R5381 Other malaise: Secondary | ICD-10-CM | POA: Insufficient documentation

## 2013-11-28 DIAGNOSIS — M129 Arthropathy, unspecified: Secondary | ICD-10-CM | POA: Insufficient documentation

## 2013-11-28 DIAGNOSIS — F411 Generalized anxiety disorder: Secondary | ICD-10-CM | POA: Insufficient documentation

## 2013-11-28 DIAGNOSIS — M25551 Pain in right hip: Secondary | ICD-10-CM

## 2013-11-28 DIAGNOSIS — Z794 Long term (current) use of insulin: Secondary | ICD-10-CM | POA: Insufficient documentation

## 2013-11-28 DIAGNOSIS — Z8673 Personal history of transient ischemic attack (TIA), and cerebral infarction without residual deficits: Secondary | ICD-10-CM | POA: Insufficient documentation

## 2013-11-28 DIAGNOSIS — J441 Chronic obstructive pulmonary disease with (acute) exacerbation: Secondary | ICD-10-CM | POA: Insufficient documentation

## 2013-11-28 HISTORY — DX: Bursopathy, unspecified: M71.9

## 2013-11-28 MED ORDER — OXYCODONE-ACETAMINOPHEN 5-325 MG PO TABS: 1.0000 | ORAL_TABLET | Freq: Three times a day (TID) | ORAL | Status: DC | PRN

## 2013-11-28 MED ORDER — OXYCODONE-ACETAMINOPHEN 5-325 MG PO TABS
1.0000 | ORAL_TABLET | Freq: Once | ORAL | Status: AC
  Administered 2013-11-28: 1 via ORAL
  Filled 2013-11-28: qty 1

## 2013-11-28 MED ORDER — SODIUM CHLORIDE 0.9 % IV SOLN
1020.0000 mg | Freq: Once | INTRAVENOUS | Status: DC
  Filled 2013-11-28: qty 34

## 2013-11-28 NOTE — ED Notes (Signed)
Pt c/o pain in right hip since this am.  Pt denies injury.  St's she has hx of bursitis in left hip.

## 2013-11-28 NOTE — Discharge Instructions (Signed)
Please call your doctor for a followup appointment within 24-48 hours. When you talk to your doctor please let them know that you were seen in the emergency department and have them acquire all of your records so that they can discuss the findings with you and formulate a treatment plan to fully care for your new and ongoing problems. Please keep appointment with Dr. Tobie Lords at 11:00am  Please take medications as prescribed - while on pain medications there is to be no drinking alcohol, driving, operating any heavy machinery. While on medications please do not take any extra Tylenol for this can lead to Tylenol overdose and liver issues.  Please rest and stay hydrated Please avoid any physical or strenuous activity Please call and set-up an appointment with orthopedics Please continue to monitor symptoms closely and if symptoms are to worsen or change (fever greater than 101, chills, sweating, nausea, vomiting, chest pain, shortness of breath, difficulty breathing, fall, injury, headache, numbness, tingling, worsening or changes to hip pain, loss of sensation) please report back to the ED immediately    Arthralgia Arthralgia is joint pain. A joint is a place where two bones meet. Joint pain can happen for many reasons. The joint can be bruised, stiff, infected, or weak from aging. Pain usually goes away after resting and taking medicine for soreness.  HOME CARE  Rest the joint as told by your doctor.  Keep the sore joint raised (elevated) for the first 24 hours.  Put ice on the joint area.  Put ice in a plastic bag.  Place a towel between your skin and the bag.  Leave the ice on for 15-20 minutes, 03-04 times a day.  Wear your splint, casting, elastic bandage, or sling as told by your doctor.  Only take medicine as told by your doctor. Do not take aspirin.  Use crutches as told by your doctor. Do not put weight on the joint until told to by your doctor. GET HELP RIGHT AWAY IF:   You  have bruising, puffiness (swelling), or more pain.  Your fingers or toes turn blue or start to lose feeling (numb).  Your medicine does not lessen the pain.  Your pain becomes severe.  You have a temperature by mouth above 102 F (38.9 C), not controlled by medicine.  You cannot move or use the joint. MAKE SURE YOU:   Understand these instructions.  Will watch your condition.  Will get help right away if you are not doing well or get worse. Document Released: 06/07/2009 Document Revised: 09/11/2011 Document Reviewed: 06/07/2009 [facility] Patient Information 2014 [place], [place].

## 2013-11-28 NOTE — ED Provider Notes (Signed)
CSN: [id number]     Arrival date & time 11/28/13  0115 History   First MD Initiated Contact with Patient 11/28/13 [phone]     Chief Complaint  Patient presents with  . Hip Pain     (Consider location/radiation/quality/duration/timing/severity/associated sxs/prior Treatment) The history is provided by the patient. No language interpreter was used.  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 Patient reported that the pain is better when she is standing. Stated that she has been using Tylenol with minimal relief. Reported that she normally has issues in her left hip - diagnosed with bursitis years ago, but stated that she is seen by her PCP for cortisone injections. Reported that the pain worsened earlier this morning. Stated that she normally takes Percocets for pain. Denied fall, injury, trauma, chest pain, shortness of breath, difficulty breathing, numbness, tingling, urinary issues, fever, chills, changes to skin color. PCP Dr. Tobie Lords  Past Medical History  Diagnosis Date  . Hypertension   . COPD (chronic obstructive pulmonary disease)   . GERD (gastroesophageal reflux disease)   . Hypercholesteremia   . Anxiety   . Stroke   . Headaches due to old head injury   . Blind one eye   . Angina   . Shortness of breath   . Pneumonia   . Recurrent upper respiratory infection (URI)   . Anemia   . Arthritis   . Complication of anesthesia     difficulty waking up  . Chronic kidney disease   . Cancer     skin cancer right thumb  . Temporal arteritis 11/17/2011  . Gastric polyp     antral  . Stenosis  of esophagus   . Diabetes mellitus     TYpe 2 IDDM x 11 years  . Bursitis    Past Surgical History  Procedure Laterality Date  . Abdominal hysterectomy    . Cholecystectomy    . Heel spur surgery      bilateral  . Esophagogastroduodenoscopy  11/18/2011    Procedure: ESOPHAGOGASTRODUODENOSCOPY (EGD);  Surgeon: Gatha Mayer, MD;  [facility]: [facility] ENDOSCOPY;  Service: Endoscopy;  Laterality: N/A;  . Eye surgery      cataracts/IOL  . Brain surgery      temporal arteritis  . Av fistula placement Right 08/14/2013    Procedure: ARTERIOVENOUS (AV) FISTULA CREATION;  Surgeon: Serafina Mitchell, MD;  Location: [facility];  Service: Vascular;  Laterality: Right;  . Av fistula placement Right 08/14/2013    Procedure:  CREATION BRACHIOCEPHALIC ARTERIOVENOUS (AV) FISTULA;  Surgeon: Serafina Mitchell, MD;  Location: [facility] OR;  Service: Vascular;  Laterality: Right;   Family History  Problem Relation Age of Onset  . Colon cancer    . Diabetes Brother   . Hypertension Mother   . COPD Father   . Diabetes Father    History  Substance Use Topics  . Smoking status: Never Smoker   . Smokeless tobacco: Never Used  . Alcohol Use: No   OB History   Grav Para Term Preterm Abortions TAB SAB Ect Mult Living  Review of Systems  Respiratory: Negative for chest tightness and shortness of breath.   Cardiovascular: Negative for chest pain.  Gastrointestinal: Negative for nausea, vomiting, abdominal pain, diarrhea, constipation, blood in stool and anal bleeding.  Genitourinary: Negative for dysuria.  Musculoskeletal: Positive for arthralgias (right hip pain ). Negative for joint swelling.  Neurological: Negative for weakness and numbness.      Allergies  Codeine  Home Medications   Prior to Admission medications   Medication Sig Start Date End Date Taking? Authorizing Provider  albuterol (PROVENTIL) (2.5 MG/3ML) 0.083% nebulizer solution Take 2.5 mg by nebulization every 6 (six) hours as  needed for wheezing. For wheezing   Yes Historical Provider, MD  amitriptyline (ELAVIL) 10 MG tablet Take 10 mg by mouth daily.   Yes Historical Provider, MD  aspirin 325 MG tablet Take 325 mg by mouth daily.   Yes Historical Provider, MD  atorvastatin (LIPITOR) 40 MG tablet Take 40 mg by mouth every morning.    Yes Historical Provider, MD  DULoxetine (CYMBALTA) 60 MG capsule Take 60 mg by mouth daily.   Yes Historical Provider, MD  furosemide (LASIX) 40 MG tablet Take 40 mg by mouth daily.   Yes Historical Provider, MD  insulin aspart (NOVOLOG) 100 UNIT/ML injection Inject 13-16 Units into the skin 4 (four) times daily -  before meals and at bedtime.    Yes Historical Provider, MD  insulin detemir (LEVEMIR) 100 UNIT/ML injection Inject 30-36 Units into the skin at bedtime.    Yes Historical Provider, MD  loratadine (CLARITIN) 10 MG tablet Take 10 mg by mouth every morning.    Yes Historical Provider, MD  losartan (COZAAR) 25 MG tablet Take 25 mg by mouth daily.   Yes Historical Provider, MD  lubiprostone (AMITIZA) 24 MCG capsule Take 24 mcg by mouth daily with breakfast.   Yes Historical Provider, MD  oxyCODONE-acetaminophen (PERCOCET/ROXICET) 5-325 MG per tablet Take 1 tablet by mouth every 6 (six) hours as needed for severe pain. 11/18/13  Yes Merryl Hacker, MD  pantoprazole (PROTONIX) 40 MG tablet Take 40 mg by mouth every morning.   Yes Historical Provider, MD  potassium chloride (K-DUR) 10 MEQ tablet Take 10 mEq by mouth every morning.   Yes Historical Provider, MD  ciprofloxacin (CIPRO) 500 MG tablet Take 1 tablet (500 mg total) by mouth 2 (two) times daily. 11/18/13   Merryl Hacker, MD  metroNIDAZOLE (FLAGYL) 500 MG tablet Take 1 tablet (500 mg total) by mouth 3 (three) times daily. 11/18/13   Merryl Hacker, MD  oxyCODONE-acetaminophen (PERCOCET/ROXICET) 5-325 MG per tablet Take 1 tablet by mouth every 8 (eight) hours as needed for moderate pain or severe pain. 11/28/13   Harriet Sutphen, PA-C   BP 112/81  Pulse 69  Temp(Src) 98 F (36.7 C) (Oral)  Resp 16  Ht 5' (1.524 m)  Wt 189 lb (85.73 kg)  BMI 36.91 kg/m2  SpO2 97% Physical Exam  Nursing note and vitals reviewed. Constitutional: She is oriented to person, place, and time. She appears well-developed and well-nourished. No distress.  HENT:  Head: Normocephalic and atraumatic.  Mouth/Throat: Oropharynx is clear and moist. No oropharyngeal exudate.  Eyes: Conjunctivae and EOM are normal. Pupils are equal, round, and reactive to light. Right eye exhibits no discharge. Left eye exhibits no discharge.  Neck: Normal range of motion. Neck supple. No tracheal deviation present.  Cardiovascular: Normal rate, regular rhythm and normal heart sounds.   Pulses:  Radial pulses are 2+ on the right side, and 2+ on the left side.       Dorsalis pedis pulses are 2+ on the right side, and 2+ on the left side.       Posterior tibial pulses are 2+ on the right side, and 2+ on the left side.  Pulmonary/Chest: Effort normal and breath sounds normal. No respiratory distress. She has no wheezes. She has no rales.  Abdominal: Soft. Bowel sounds are normal. She exhibits no distension. There is no tenderness. There is no rebound and no guarding.  Obese Negative abdominal distension noted Abdomen soft upon palpation  Negative pain upon palpation - negative rigidity or guarding noted  Musculoskeletal: Normal range of motion. She exhibits tenderness. She exhibits no edema.       Right hip: She exhibits tenderness and bony tenderness. She exhibits normal range of motion, normal strength, no swelling, no crepitus, no deformity and no laceration.       Legs: Negative swelling, erythema, inflammation, lesions, sores, deformities noted to the right hip. Negative crepitus upon palpation. Negative warmth upon palpation. Discomfort upon palpation to the acetabulum. Full ROM noted to the right hip without difficulty - full flexion,  extension, abduction, adduction to the right hip. Full ROM to the right knee, ankle, digits of the right foot.   Lymphadenopathy:    She has no cervical adenopathy.  Neurological: She is alert and oriented to person, place, and time. No cranial nerve deficit. She exhibits normal muscle tone. Coordination normal.  Cranial nerves III-XII grossly intact Strength 5+/5+ to lower extremities bilaterally with resistance applied, equal distribution noted - left side noted to be mildly weak - patient has history of stroke and has left her with left sided weakness Strength intact to the digits of the right foot Sensation intact with differentiation to sharp and dull touch  Negative romberg Gait proper, proper balance - negative sway, negative drift, negative step-offs  Skin: Skin is warm and dry. No rash noted. She is not diaphoretic. No erythema.  Psychiatric: She has a normal mood and affect. Her behavior is normal. Thought content normal.    ED Course  Procedures (including critical care time)  8:58 PM Dr. Stevie Kern in to see and assess patient.   Labs Review Labs Reviewed - No data to display  Imaging Review Dg Hip Complete Right  11/28/2013   CLINICAL DATA:  Right hip pain.  EXAM: RIGHT HIP - COMPLETE 2+ VIEW  COMPARISON:  None.  FINDINGS: There is no evidence of fracture or dislocation. Both femoral heads are seated normally within their respective acetabula. The proximal right femur appears intact. No significant degenerative change is appreciated. The sacroiliac joints are unremarkable in appearance.  The visualized bowel gas pattern is grossly unremarkable in appearance. Bilateral tubal ligation clips are seen.  IMPRESSION: No evidence of fracture or dislocation.   Electronically Signed   By: Garald Balding M.D.   On: 11/28/2013 05:50   Ct Hip Right Wo Contrast  11/28/2013   CLINICAL DATA:  Posterior hip pain for 3 days.  No known injury.  EXAM: CT OF THE RIGHT HIP WITHOUT CONTRAST  TECHNIQUE:  Multidetector CT imaging was performed according to the standard protocol. Multiplanar CT image reconstructions were also generated.  COMPARISON:  Plain films of the right hip 11/28/2017. CT abdomen and pelvis 11/18/2013 and 11/17/2011.  FINDINGS: The right hip is located and there is no fracture. Small sclerotic lesions in the posterior right ilium are compatible with  bone islands and unchanged. There is no degenerative disease about the right hip. No hip joint effusion is seen. All visualized pelvic musculature appears normal. No evidence of bursitis is seen. Imaged intrapelvic contents are unremarkable.  IMPRESSION: Negative examination.  No finding to explain the patient's pain.   Electronically Signed   By: Inge Rise M.D.   On: 11/28/2013 08:07   Dg Knee Complete 4 Views Right  11/28/2013   CLINICAL DATA:  Right knee pain.  EXAM: RIGHT KNEE - COMPLETE 4+ VIEW  COMPARISON:  None.  FINDINGS: There is no evidence of fracture, dislocation, or joint effusion. Mild tricompartmental osteoarthritis noted. No other significant bone abnormality. Soft tissues are unremarkable.  IMPRESSION: No acute findings.  Mild tricompartmental osteoarthritis.   Electronically Signed   By: Earle Gell M.D.   On: 11/28/2013 07:34     EKG Interpretation None      MDM   Final diagnoses:  Right hip pain   Medications  oxyCODONE-acetaminophen (PERCOCET/ROXICET) 5-325 MG per tablet 1 tablet (1 tablet Oral Given 11/28/13 0659)   Filed Vitals:   11/28/13 0600 11/28/13 0640 11/28/13 0830 11/28/13 0900  BP: 153/93 135/72 162/82 112/81  Pulse:  69    Temp:  98 F (36.7 C)    TempSrc:  Oral    Resp:  18 16   Height:      Weight:      SpO2:  97%      Plain film of right hip negative for acute fracture or dislocation - unremarkable plain film. Plain film of right knee negative for acute os injury, dislocation or joint effusion-mild tricompartmental osteoporosis noted with no significant bone abnormalities or  soft tissue changes. CT of the right hip negative for acute osseous injuries, there is no degenerative disease noted, no joint effusion. No evidence of bursitis. Imaging unremarkable. Definitive etiology of right hip pain unknown. Patient ambulated well with negative changes or ataxic gait noted. Patient seen and assessed by attending physician, Dr. Rebeca Alert - who cleared patient for discharge. Negative focal neurological deficits noted. Gait proper. Strength intact. Pulses palpable and strong. Pain controlled in ED setting. Patient with son who reported that patient has an appointment with Dr. Tobie Lords - PCP at 11:00AM. Patient stable, afebrile. Discharged patient. Discharged patient with small dose of pain medications - discussed course, precautions and disposal technique. Discussed with patient to rest and stay hydrated. Discussed with patient to avoid any physical or strenuous activity. Discussed with patient to closely monitor symptoms and if symptoms are to worsen or change to report back to the ED - strict return instructions given.  Patient agreed to plan of care, understood, all questions answered.   Jamse Mead, PA-C 11/28/13 [phone]

## 2013-11-28 NOTE — ED Notes (Signed)
Pt requesting pain medication. Informed patient that this RN has informed the MD.

## 2013-11-28 NOTE — ED Notes (Signed)
Patient transported to X-ray 

## 2013-11-28 NOTE — ED Notes (Signed)
Pt is requesting pain medication. Dr. Kathrynn Humble made aware.

## 2013-11-28 NOTE — ED Provider Notes (Signed)
Medical screening examination/treatment/procedure(s) were conducted as a shared visit with non-physician practitioner(s) and myself.  I personally evaluated the patient during the encounter.  Good active and passive movement right hip mild right hip tenderness.   Babette Relic, MD 11/30/13 Dyann Kief

## 2013-11-28 NOTE — Progress Notes (Signed)
Pt had another appointment and the office would not wait for them and was going to cancel them so the patient said to take her IV out and let her go

## 2013-11-28 NOTE — ED Notes (Signed)
Pt walked to the bathroom. 

## 2013-11-30 ENCOUNTER — Emergency Department (HOSPITAL_COMMUNITY)
Admission: EM | Admit: 2013-11-30 | Discharge: 2013-11-30 | Disposition: A | Discharge status: Home / Self Care | Payer: Medicare Other | Attending: Emergency Medicine | Admitting: Emergency Medicine

## 2013-11-30 ENCOUNTER — Encounter (HOSPITAL_COMMUNITY): Payer: Self-pay | Admitting: Emergency Medicine

## 2013-11-30 DIAGNOSIS — M1710 Unilateral primary osteoarthritis, unspecified knee: Secondary | ICD-10-CM

## 2013-11-30 DIAGNOSIS — E119 Type 2 diabetes mellitus without complications: Secondary | ICD-10-CM | POA: Insufficient documentation

## 2013-11-30 DIAGNOSIS — J4489 Other specified chronic obstructive pulmonary disease: Secondary | ICD-10-CM | POA: Insufficient documentation

## 2013-11-30 DIAGNOSIS — F411 Generalized anxiety disorder: Secondary | ICD-10-CM | POA: Insufficient documentation

## 2013-11-30 DIAGNOSIS — Z8673 Personal history of transient ischemic attack (TIA), and cerebral infarction without residual deficits: Secondary | ICD-10-CM | POA: Insufficient documentation

## 2013-11-30 DIAGNOSIS — X58XXXA Exposure to other specified factors, initial encounter: Secondary | ICD-10-CM | POA: Insufficient documentation

## 2013-11-30 DIAGNOSIS — M316 Other giant cell arteritis: Secondary | ICD-10-CM | POA: Insufficient documentation

## 2013-11-30 DIAGNOSIS — E669 Obesity, unspecified: Secondary | ICD-10-CM | POA: Insufficient documentation

## 2013-11-30 DIAGNOSIS — Y929 Unspecified place or not applicable: Secondary | ICD-10-CM | POA: Insufficient documentation

## 2013-11-30 DIAGNOSIS — Z8709 Personal history of other diseases of the respiratory system: Secondary | ICD-10-CM | POA: Insufficient documentation

## 2013-11-30 DIAGNOSIS — M25559 Pain in unspecified hip: Secondary | ICD-10-CM

## 2013-11-30 DIAGNOSIS — M545 Low back pain, unspecified: Secondary | ICD-10-CM

## 2013-11-30 DIAGNOSIS — I209 Angina pectoris, unspecified: Secondary | ICD-10-CM | POA: Insufficient documentation

## 2013-11-30 DIAGNOSIS — D649 Anemia, unspecified: Secondary | ICD-10-CM | POA: Insufficient documentation

## 2013-11-30 DIAGNOSIS — D131 Benign neoplasm of stomach: Secondary | ICD-10-CM | POA: Insufficient documentation

## 2013-11-30 DIAGNOSIS — Z7982 Long term (current) use of aspirin: Secondary | ICD-10-CM | POA: Insufficient documentation

## 2013-11-30 DIAGNOSIS — E7800 Pure hypercholesterolemia, unspecified: Secondary | ICD-10-CM | POA: Insufficient documentation

## 2013-11-30 DIAGNOSIS — Z79899 Other long term (current) drug therapy: Secondary | ICD-10-CM | POA: Insufficient documentation

## 2013-11-30 DIAGNOSIS — Z794 Long term (current) use of insulin: Secondary | ICD-10-CM | POA: Insufficient documentation

## 2013-11-30 DIAGNOSIS — IMO0001 Reserved for inherently not codable concepts without codable children: Secondary | ICD-10-CM | POA: Insufficient documentation

## 2013-11-30 DIAGNOSIS — G44309 Post-traumatic headache, unspecified, not intractable: Secondary | ICD-10-CM | POA: Insufficient documentation

## 2013-11-30 DIAGNOSIS — K219 Gastro-esophageal reflux disease without esophagitis: Secondary | ICD-10-CM | POA: Insufficient documentation

## 2013-11-30 DIAGNOSIS — H5440 Blindness, one eye, unspecified eye: Secondary | ICD-10-CM | POA: Insufficient documentation

## 2013-11-30 DIAGNOSIS — K222 Esophageal obstruction: Secondary | ICD-10-CM | POA: Insufficient documentation

## 2013-11-30 DIAGNOSIS — Z885 Allergy status to narcotic agent status: Secondary | ICD-10-CM | POA: Insufficient documentation

## 2013-11-30 DIAGNOSIS — M25569 Pain in unspecified knee: Secondary | ICD-10-CM

## 2013-11-30 DIAGNOSIS — M7150 Other bursitis, not elsewhere classified, unspecified site: Secondary | ICD-10-CM | POA: Insufficient documentation

## 2013-11-30 DIAGNOSIS — J449 Chronic obstructive pulmonary disease, unspecified: Secondary | ICD-10-CM | POA: Insufficient documentation

## 2013-11-30 DIAGNOSIS — Y939 Activity, unspecified: Secondary | ICD-10-CM | POA: Insufficient documentation

## 2013-11-30 DIAGNOSIS — I10 Essential (primary) hypertension: Secondary | ICD-10-CM | POA: Insufficient documentation

## 2013-11-30 DIAGNOSIS — R0602 Shortness of breath: Secondary | ICD-10-CM | POA: Insufficient documentation

## 2013-11-30 DIAGNOSIS — Z8701 Personal history of pneumonia (recurrent): Secondary | ICD-10-CM | POA: Insufficient documentation

## 2013-11-30 DIAGNOSIS — M2550 Pain in unspecified joint: Secondary | ICD-10-CM | POA: Insufficient documentation

## 2013-11-30 DIAGNOSIS — M179 Osteoarthritis of knee, unspecified: Secondary | ICD-10-CM

## 2013-11-30 MED ORDER — DIAZEPAM 5 MG PO TABS: 5.0000 mg | ORAL_TABLET | Freq: Four times a day (QID) | ORAL | Status: DC | PRN

## 2013-11-30 MED ORDER — PREDNISONE 20 MG PO TABS: 20.0000 mg | ORAL_TABLET | Freq: Two times a day (BID) | ORAL | Status: DC

## 2013-11-30 NOTE — ED Provider Notes (Signed)
CSN: [id number]     Arrival date & time 11/30/13  1606 History   First MD Initiated Contact with Patient 11/30/13 1740     Chief Complaint  Patient presents with  . Hip Pain     (Consider location/radiation/quality/duration/timing/severity/associated sxs/prior Treatment) HPI  Errica Swalley is a 68 y.o. female who is here for evaluation of right low back pain, and right knee pain. Discomfort, started without trauma 1 week ago. She was evaluated here, 2 days ago, and diagnosed with nonspecific right hip pain and had imaging, consistent with severe right knee arthritis. She's using the prescribed, Percocet, without relief. She also saw her PCP, one day ago, and received a injection in her right hip, which has not helped her pain. She does not have an orthopedist. She's never been evaluated for knee problems previously. She denies dysuria or urinary frequency, urinary hesitancy or urinary retention. There's been no fecal incontinence. She is able to ambulate. There are no other known modifying factors   Past Medical History  Diagnosis Date  . Hypertension   . COPD (chronic obstructive pulmonary disease)   . GERD (gastroesophageal reflux disease)   . Hypercholesteremia   . Anxiety   . Stroke   . Headaches due to old head injury   . Blind one eye   . Angina   . Shortness of breath   . Pneumonia   . Recurrent upper respiratory infection (URI)   . Anemia   . Arthritis   . Complication of anesthesia     difficulty waking up  . Chronic kidney disease   . Cancer     skin cancer right thumb  . Temporal arteritis 11/17/2011  . Gastric polyp     antral  . Stenosis of esophagus   . Diabetes mellitus     TYpe 2 IDDM x 11 years  . Bursitis    Past Surgical History  Procedure Laterality Date  . Abdominal hysterectomy    . Cholecystectomy    . Heel spur surgery      bilateral  . Esophagogastroduodenoscopy  11/18/2011    Procedure: ESOPHAGOGASTRODUODENOSCOPY (EGD);  Surgeon: Gatha Mayer, MD;  Location: [facility] ENDOSCOPY;  Service: Endoscopy;  Laterality: N/A;  . Eye surgery      cataracts/IOL  . Brain surgery      temporal arteritis  . Av fistula placement Right 08/14/2013    Procedure: ARTERIOVENOUS (AV) FISTULA CREATION;  Surgeon: Serafina Mitchell, MD;  Location: [place];  Service: Vascular;  Laterality: Right;  . Av fistula placement Right 08/14/2013    Procedure:  CREATION BRACHIOCEPHALIC ARTERIOVENOUS (AV) FISTULA;  Surgeon: Serafina Mitchell, MD;  Location: [facility] OR;  Service: Vascular;  Laterality: Right;   Family History  Problem Relation Age of Onset  . Colon cancer    . Diabetes Brother   . Hypertension Mother   . COPD Father   . Diabetes Father    History  Substance Use Topics  . Smoking status: Never Smoker   . Smokeless tobacco: Never Used  . Alcohol Use: No   OB History   Grav Para Term Preterm Abortions TAB SAB Ect Mult Living                 Review of Systems  All other systems reviewed and are negative.     Allergies  Codeine  Home Medications   Prior to Admission medications   Medication Sig Start Date End Date Taking? Authorizing Provider  Acetaminophen (TYLENOL PO)  Take 2 tablets by mouth every 6 (six) hours as needed (pain).   Yes Historical Provider, MD  albuterol (PROVENTIL) (2.5 MG/3ML) 0.083% nebulizer solution Take 2.5 mg by nebulization every 6 (six) hours as needed for wheezing. For wheezing   Yes Historical Provider, MD  aspirin 325 MG tablet Take 325 mg by mouth daily.   Yes Historical Provider, MD  ciprofloxacin (CIPRO) 500 MG tablet Take 1 tablet (500 mg total) by mouth 2 (two) times daily. 11/18/13  Yes Merryl Hacker, MD  furosemide (LASIX) 40 MG tablet Take 40 mg by mouth daily.   Yes Historical Provider, MD  insulin aspart (NOVOLOG) 100 UNIT/ML injection Inject 13-16 Units into the skin 4 (four) times daily -  before meals and at bedtime. Per sliding scale   Yes Historical Provider, MD  insulin detemir (LEVEMIR) 100  UNIT/ML injection Inject 36 Units into the skin at bedtime.    Yes Historical Provider, MD  loratadine (CLARITIN) 10 MG tablet Take 10 mg by mouth every morning.    Yes Historical Provider, MD  losartan (COZAAR) 25 MG tablet Take 25 mg by mouth daily.   Yes Historical Provider, MD  lubiprostone (AMITIZA) 24 MCG capsule Take 24 mcg by mouth daily with breakfast.   Yes Historical Provider, MD  metroNIDAZOLE (FLAGYL) 500 MG tablet Take 1 tablet (500 mg total) by mouth 3 (three) times daily. 11/18/13  Yes Merryl Hacker, MD  oxyCODONE-acetaminophen (PERCOCET/ROXICET) 5-325 MG per tablet Take 1 tablet by mouth every 8 (eight) hours as needed for moderate pain or severe pain. 11/28/13  Yes Marissa Sciacca, PA-C  pantoprazole (PROTONIX) 40 MG tablet Take 40 mg by mouth every morning.   Yes Historical Provider, MD  potassium chloride (K-DUR) 10 MEQ tablet Take 10 mEq by mouth every morning.   Yes Historical Provider, MD   BP 148/88  Pulse 63  Temp(Src) 98 F (36.7 C) (Oral)  Resp 18  SpO2 97% Physical Exam  Nursing note and vitals reviewed. Constitutional: She is oriented to person, place, and time. She appears well-developed.  Overweight elderly, female  HENT:  Head: Normocephalic and atraumatic.  Eyes: Conjunctivae and EOM are normal. Pupils are equal, round, and reactive to light.  Neck: Normal range of motion and phonation normal. Neck supple.  Cardiovascular: Normal rate, regular rhythm and intact distal pulses.   Pulmonary/Chest: Effort normal and breath sounds normal. She exhibits no tenderness.  Abdominal: Soft. She exhibits no distension. There is no tenderness. There is no guarding.  Musculoskeletal: Normal range of motion.  Mild right lumbar tenderness to palpation. There is no limitation of motion of the right hip with passive manipulation. There is no apparent bursitis, of the right hip. Right knee is mildly swollen without crepitation on passive range of motion. There is no right  knee effusion.  Neurological: She is alert and oriented to person, place, and time. She exhibits normal muscle tone.  Skin: Skin is warm and dry.  Psychiatric: She has a normal mood and affect. Her behavior is normal. Judgment and thought content normal.    ED Course  Procedures (including critical care time)      MDM   Final diagnoses:  Hip pain  Knee pain  Low back pain  Degenerative joint disease of knee    Low back and right knee pain is most likely related to right knee arthritis with radiation of pain proximally. She also may have sciatica. I doubt acute spinal myelopathy, septic joint, occult fracture, or  metabolic instability.  Nursing Notes Reviewed/ Care Coordinated Applicable Imaging Reviewed Interpretation of Laboratory Data incorporated into ED treatment  The patient appears reasonably screened and/or stabilized for discharge and I doubt any other medical condition or other [facility] requiring further screening, evaluation, or treatment in the ED at this time prior to discharge.  Plan: Home Medications- Valium, Prednisone; Home Treatments- rest, heat; return here if the recommended treatment, does not improve the symptoms; Recommended follow up- Ortho f/u 3 days    Richarda Blade, MD 11/30/13 1807

## 2013-11-30 NOTE — Discharge Instructions (Signed)
Arthritis, Nonspecific °Arthritis is inflammation of a joint. This usually means pain, redness, warmth or swelling are present. One or more joints may be involved. There are a number of types of arthritis. Your caregiver may not be able to tell what type of arthritis you have right away. °CAUSES  °The most common cause of arthritis is the wear and tear on the joint (osteoarthritis). This causes damage to the cartilage, which can break down over time. The knees, hips, back and neck are most often affected by this type of arthritis. °Other types of arthritis and common causes of joint pain include: °· Sprains and other injuries near the joint. Sometimes minor sprains and injuries cause pain and swelling that develop hours later. °· Rheumatoid arthritis. This affects hands, feet and knees. It usually affects both sides of your body at the same time. It is often associated with chronic ailments, fever, weight loss and general weakness. °· Crystal arthritis. Gout and pseudo gout can cause occasional acute severe pain, redness and swelling in the foot, ankle, or knee. °· Infectious arthritis. Bacteria can get into a joint through a break in overlying skin. This can cause infection of the joint. Bacteria and viruses can also spread through the blood and affect your joints. °· Drug, infectious and allergy reactions. Sometimes joints can become mildly painful and slightly swollen with these types of illnesses. °SYMPTOMS  °· Pain is the main symptom. °· Your joint or joints can also be red, swollen and warm or hot to the touch. °· You may have a fever with certain types of arthritis, or even feel overall ill. °· The joint with arthritis will hurt with movement. Stiffness is present with some types of arthritis. °DIAGNOSIS  °Your caregiver will suspect arthritis based on your description of your symptoms and on your exam. Testing may be needed to find the type of arthritis: °· Blood and sometimes urine tests. °· X-ray tests  and sometimes CT or MRI scans. °· Removal of fluid from the joint (arthrocentesis) is done to check for bacteria, crystals or other causes. Your caregiver (or a specialist) will numb the area over the joint with a local anesthetic, and use a needle to remove joint fluid for examination. This procedure is only minimally uncomfortable. °· Even with these tests, your caregiver may not be able to tell what kind of arthritis you have. Consultation with a specialist (rheumatologist) may be helpful. °TREATMENT  °Your caregiver will discuss with you treatment specific to your type of arthritis. If the specific type cannot be determined, then the following general recommendations may apply. °Treatment of severe joint pain includes: °· Rest. °· Elevation. °· Anti-inflammatory medication (for example, ibuprofen) may be prescribed. Avoiding activities that cause increased pain. °· Only take over-the-counter or prescription medicines for pain and discomfort as recommended by your caregiver. °· Cold packs over an inflamed joint may be used for 10 to 15 minutes every hour. Hot packs sometimes feel better, but do not use overnight. Do not use hot packs if you are diabetic without your caregiver's permission. °· A cortisone shot into arthritic joints may help reduce pain and swelling. °· Any acute arthritis that gets worse over the next 1 to 2 days needs to be looked at to be sure there is no joint infection. °Long-term arthritis treatment involves modifying activities and lifestyle to reduce joint stress jarring. This can include weight loss. Also, exercise is needed to nourish the joint cartilage and remove waste. This helps keep the muscles   around the joint strong. HOME CARE INSTRUCTIONS   Do not take aspirin to relieve pain if gout is suspected. This elevates uric acid levels.  Only take over-the-counter or prescription medicines for pain, discomfort or fever as directed by your caregiver.  Rest the joint as much as  possible.  If your joint is swollen, keep it elevated.  Use crutches if the painful joint is in your leg.  Drinking plenty of fluids may help for certain types of arthritis.  Follow your caregiver's dietary instructions.  Try low-impact exercise such as:  Swimming.  Water aerobics.  Biking.  Walking.  Morning stiffness is often relieved by a warm shower.  Put your joints through regular range-of-motion. SEEK MEDICAL CARE IF:   You do not feel better in 24 hours or are getting worse.  You have side effects to medications, or are not getting better with treatment. SEEK IMMEDIATE MEDICAL CARE IF:   You have a fever.  You develop severe joint pain, swelling or redness.  Many joints are involved and become painful and swollen.  There is severe back pain and/or leg weakness.  You have loss of bowel or bladder control. Document Released: 07/27/2004 Document Revised: 09/11/2011 Document Reviewed: 08/12/2008 [facility] Patient Information 2014 [place].  Back Pain, Adult Low back pain is very common. About 1 in 5 people have back pain.The cause of low back pain is rarely dangerous. The pain often gets better over time.About half of people with a sudden onset of back pain feel better in just 2 weeks. About 8 in 10 people feel better by 6 weeks.  CAUSES Some common causes of back pain include:  Strain of the muscles or ligaments supporting the spine.  Wear and tear (degeneration) of the spinal discs.  Arthritis.  Direct injury to the back. DIAGNOSIS Most of the time, the direct cause of low back pain is not known.However, back pain can be treated effectively even when the exact cause of the pain is unknown.Answering your caregiver's questions about your overall health and symptoms is one of the most accurate ways to make sure the cause of your pain is not dangerous. If your caregiver needs more information, he or she may order lab work or imaging tests (X-rays or  MRIs).However, even if imaging tests show changes in your back, this usually does not require surgery. HOME CARE INSTRUCTIONS For many people, back pain returns.Since low back pain is rarely dangerous, it is often a condition that people can learn to [facility] their own.   Remain active. It is stressful on the back to sit or stand in one place. Do not sit, drive, or stand in one place for more than 30 minutes at a time. Take short walks on level surfaces as soon as pain allows.Try to increase the length of time you walk each day.  Do not stay in bed.Resting more than 1 or 2 days can delay your recovery.  Do not avoid exercise or work.Your body is made to move.It is not dangerous to be active, even though your back may hurt.Your back will likely heal faster if you return to being active before your pain is gone.  Pay attention to your body when you bend and lift. Many people have less discomfortwhen lifting if they bend their knees, keep the load close to their bodies,and avoid twisting. Often, the most comfortable positions are those that put less stress on your recovering back.  Find a comfortable position to sleep. Use a firm mattress  and lie on your side with your knees slightly bent. If you lie on your back, put a pillow under your knees.  Only take over-the-counter or prescription medicines as directed by your caregiver. Over-the-counter medicines to reduce pain and inflammation are often the most helpful.Your caregiver may prescribe muscle relaxant drugs.These medicines help dull your pain so you can more quickly return to your normal activities and healthy exercise.  Put ice on the injured area.  Put ice in a plastic bag.  Place a towel between your skin and the bag.  Leave the ice on for 15-20 minutes, 03-04 times a day for the first 2 to 3 days. After that, ice and heat may be alternated to reduce pain and spasms.  Ask your caregiver about trying back exercises and gentle  massage. This may be of some benefit.  Avoid feeling anxious or stressed.Stress increases muscle tension and can worsen back pain.It is important to recognize when you are anxious or stressed and learn ways to manage it.Exercise is a great option. SEEK MEDICAL CARE IF:  You have pain that is not relieved with rest or medicine.  You have pain that does not improve in 1 week.  You have new symptoms.  You are generally not feeling well. SEEK IMMEDIATE MEDICAL CARE IF:   You have pain that radiates from your back into your legs.  You develop new bowel or bladder control problems.  You have unusual weakness or numbness in your arms or legs.  You develop nausea or vomiting.  You develop abdominal pain.  You feel faint. Document Released: 06/19/2005 Document Revised: 12/19/2011 Document Reviewed: 11/07/2010 [facility] Patient Information 2014 [place], [place].

## 2013-11-30 NOTE — ED Notes (Signed)
C/o R hip pain x 1 week.  Denies known injury.  States she was already seen in ED for same.  Reports she had cortisone injection on Friday.

## 2013-11-30 NOTE — ED Notes (Signed)
Patient has taken Tylenol for pain today; she is out of the pain medication prescribed in ED on Thursday. PCP gave Cortisone injection, but did not give any prescriptions for pain.

## 2013-12-05 ENCOUNTER — Encounter (HOSPITAL_COMMUNITY)
Admission: RE | Admit: 2013-12-05 | Discharge: 2013-12-05 | Disposition: A | Discharge status: Home / Self Care | Payer: Medicare HMO | Source: Ambulatory Visit | Attending: Nephrology | Admitting: Nephrology

## 2013-12-05 DIAGNOSIS — D631 Anemia in chronic kidney disease: Secondary | ICD-10-CM | POA: Insufficient documentation

## 2013-12-05 DIAGNOSIS — N186 End stage renal disease: Secondary | ICD-10-CM | POA: Insufficient documentation

## 2013-12-05 DIAGNOSIS — I120 Hypertensive chronic kidney disease with stage 5 chronic kidney disease or end stage renal disease: Secondary | ICD-10-CM | POA: Insufficient documentation

## 2013-12-05 DIAGNOSIS — N039 Chronic nephritic syndrome with unspecified morphologic changes: Principal | ICD-10-CM

## 2013-12-05 LAB — IRON AND TIBC
Iron: 68 ug/dL (ref 42–135)
Saturation Ratios: 30 % (ref 20–55)
TIBC: 227 ug/dL — ABNORMAL LOW (ref 250–470)
UIBC: 159 ug/dL (ref 125–400)

## 2013-12-05 LAB — FERRITIN: FERRITIN: 367 ng/mL — AB (ref 10–291)

## 2013-12-05 LAB — POCT HEMOGLOBIN-HEMACUE: HEMOGLOBIN: 10.2 g/dL — AB (ref 12.0–15.0)

## 2013-12-05 MED ORDER — EPOETIN ALFA 20000 UNIT/ML IJ SOLN
INTRAMUSCULAR | Status: AC
  Administered 2013-12-05: 20000 [IU] via SUBCUTANEOUS
  Filled 2013-12-05: qty 1

## 2013-12-05 MED ORDER — SODIUM CHLORIDE 0.9 % IV SOLN
1020.0000 mg | Freq: Once | INTRAVENOUS | Status: AC
  Administered 2013-12-05: 1020 mg via INTRAVENOUS
  Filled 2013-12-05: qty 34

## 2013-12-05 MED ORDER — EPOETIN ALFA 20000 UNIT/ML IJ SOLN
20000.0000 [IU] | INTRAMUSCULAR | Status: DC
  Administered 2013-12-05: 20000 [IU] via SUBCUTANEOUS

## 2014-01-01 ENCOUNTER — Encounter: Payer: Self-pay | Admitting: Surgery

## 2014-01-05 ENCOUNTER — Encounter (HOSPITAL_COMMUNITY)
Admission: RE | Admit: 2014-01-05 | Discharge: 2014-01-05 | Disposition: A | Discharge status: Home / Self Care | Payer: Medicare HMO | Source: Ambulatory Visit | Attending: Nephrology | Admitting: Nephrology

## 2014-01-05 ENCOUNTER — Encounter: Payer: Self-pay | Admitting: Surgery

## 2014-01-05 ENCOUNTER — Ambulatory Visit (HOSPITAL_COMMUNITY)
Admit: 2014-01-05 | Discharge: 2014-01-05 | Disposition: A | Discharge status: Home / Self Care | Payer: Medicare HMO | Source: Ambulatory Visit | Attending: Surgery | Admitting: Surgery

## 2014-01-05 ENCOUNTER — Ambulatory Visit (INDEPENDENT_AMBULATORY_CARE_PROVIDER_SITE_OTHER): Payer: Medicare HMO | Admitting: Surgery

## 2014-01-05 ENCOUNTER — Other Ambulatory Visit: Payer: Self-pay

## 2014-01-05 VITALS — BP 127/86 | HR 74 | Ht 60.0 in | Wt 191.0 lb

## 2014-01-05 DIAGNOSIS — N186 End stage renal disease: Secondary | ICD-10-CM | POA: Diagnosis not present

## 2014-01-05 DIAGNOSIS — Z4931 Encounter for adequacy testing for hemodialysis: Secondary | ICD-10-CM

## 2014-01-05 DIAGNOSIS — D631 Anemia in chronic kidney disease: Secondary | ICD-10-CM | POA: Insufficient documentation

## 2014-01-05 DIAGNOSIS — I120 Hypertensive chronic kidney disease with stage 5 chronic kidney disease or end stage renal disease: Secondary | ICD-10-CM | POA: Insufficient documentation

## 2014-01-05 DIAGNOSIS — N039 Chronic nephritic syndrome with unspecified morphologic changes: Principal | ICD-10-CM

## 2014-01-05 LAB — IRON AND TIBC
IRON: 84 ug/dL (ref 42–135)
SATURATION RATIOS: 55 % (ref 20–55)
TIBC: 153 ug/dL — AB (ref 250–470)
UIBC: 69 ug/dL — ABNORMAL LOW (ref 125–400)

## 2014-01-05 LAB — FERRITIN: Ferritin: 610 ng/mL — ABNORMAL HIGH (ref 10–291)

## 2014-01-05 LAB — POCT HEMOGLOBIN-HEMACUE: HEMOGLOBIN: 10.1 g/dL — AB (ref 12.0–15.0)

## 2014-01-05 MED ORDER — EPOETIN ALFA 20000 UNIT/ML IJ SOLN
INTRAMUSCULAR | Status: AC
  Filled 2014-01-05: qty 1

## 2014-01-05 MED ORDER — EPOETIN ALFA 20000 UNIT/ML IJ SOLN
20000.0000 [IU] | INTRAMUSCULAR | Status: DC
  Administered 2014-01-05: 20000 [IU] via SUBCUTANEOUS

## 2014-01-05 NOTE — Progress Notes (Signed)
Patient name: Savannah Lindsey MRN: [id number] DOB: 02/14/1946 Sex: female     Chief Complaint  Patient presents with  . Re-evaluation    6 wk f/u duplex of fistula    HISTORY OF PRESENT ILLNESS: The patient is back today for followup.  She is status post right brachiocephalic fistula creation on 08/14/2013.  She then underwent venoplasty of the vein near the arterial venous anastomosis on May 20.  She's not yet on dialysis.  Past Medical History  Diagnosis Date  . Hypertension   . COPD (chronic obstructive pulmonary disease)   . GERD (gastroesophageal reflux disease)   . Hypercholesteremia   . Anxiety   . Stroke   . Headaches due to old head injury   . Blind one eye   . Angina   . Shortness of breath   . Pneumonia   . Recurrent upper respiratory infection (URI)   . Anemia   . Arthritis   . Complication of anesthesia     difficulty waking up  . Chronic kidney disease   . Cancer     skin cancer right thumb  . Temporal arteritis 11/17/2011  . Gastric polyp     antral  . Stenosis of esophagus   . Diabetes mellitus     TYpe 2 IDDM x 11 years  . Bursitis     Past Surgical History  Procedure Laterality Date  . Abdominal hysterectomy    . Cholecystectomy    . Heel spur surgery      bilateral  . Esophagogastroduodenoscopy  11/18/2011    Procedure: ESOPHAGOGASTRODUODENOSCOPY (EGD);  Surgeon: Gatha Mayer, MD;  Location[facility] ENDOSCOPY;  Service: Endoscopy;  Laterality: N/A;  . Eye surgery      cataracts/IOL  . Brain surgery      temporal arteritis  . Av fistula placement Right 08/14/2013    Procedure: ARTERIOVENOUS (AV) FISTULA CREATION;  Surgeon: Serafina Mitchell, MD;  Location: [facility];  Service: Vascular;  Laterality: Right;  . Av fistula placement Right 08/14/2013    Procedure:  CREATION BRACHIOCEPHALIC ARTERIOVENOUS (AV) FISTULA;  Surgeon: Serafina Mitchell, MD;  Location: [place];  Service: Vascular;  Laterality: Right;    History   Social History  . Marital  Status: Widowed    Spouse Name: N/A    Number of Children: N/A  . Years of Education: N/A   Occupational History  . Not on file.   Social History Main Topics  . Smoking status: Never Smoker   . Smokeless tobacco: Never Used  . Alcohol Use: No  . Drug Use: No  . Sexual Activity: No   Other Topics Concern  . Not on file   Social History Narrative  . No narrative on file    Family History  Problem Relation Age of Onset  . Colon cancer    . Diabetes Brother   . Hypertension Mother   . COPD Father   . Diabetes Father     Allergies as of 01/05/2014 - Review Complete 01/05/2014  Allergen Reaction Noted  . Codeine Itching and Nausea And Vomiting 11/16/2011    Current Outpatient Prescriptions on File Prior to Visit  Medication Sig Dispense Refill  . Acetaminophen (TYLENOL PO) Take 2 tablets by mouth every 6 (six) hours as needed (pain).      Marland Kitchen albuterol (PROVENTIL) (2.5 MG/3ML) 0.083% nebulizer solution Take 2.5 mg by nebulization every 6 (six) hours as needed for wheezing. For wheezing      .  aspirin 325 MG tablet Take 325 mg by mouth daily.      . ciprofloxacin (CIPRO) 500 MG tablet Take 1 tablet (500 mg total) by mouth 2 (two) times daily.  28 tablet  0  . furosemide (LASIX) 40 MG tablet Take 40 mg by mouth daily.      . insulin aspart (NOVOLOG) 100 UNIT/ML injection Inject 13-16 Units into the skin 4 (four) times daily -  before meals and at bedtime. Per sliding scale      . insulin detemir (LEVEMIR) 100 UNIT/ML injection Inject 36 Units into the skin at bedtime.       Marland Kitchen loratadine (CLARITIN) 10 MG tablet Take 10 mg by mouth every morning.       Marland Kitchen losartan (COZAAR) 25 MG tablet Take 25 mg by mouth daily.      Marland Kitchen lubiprostone (AMITIZA) 24 MCG capsule Take 24 mcg by mouth daily with breakfast.      . metroNIDAZOLE (FLAGYL) 500 MG tablet Take 1 tablet (500 mg total) by mouth 3 (three) times daily.  42 tablet  0  . pantoprazole (PROTONIX) 40 MG tablet Take 40 mg by mouth every  morning.      . potassium chloride (K-DUR) 10 MEQ tablet Take 10 mEq by mouth every morning.      . diazepam (VALIUM) 5 MG tablet Take 1 tablet (5 mg total) by mouth every 6 (six) hours as needed (spasms).  20 tablet  0  . oxyCODONE-acetaminophen (PERCOCET/ROXICET) 5-325 MG per tablet Take 1 tablet by mouth every 8 (eight) hours as needed for moderate pain or severe pain.  7 tablet  0  . predniSONE (DELTASONE) 20 MG tablet Take 1 tablet (20 mg total) by mouth 2 (two) times daily.  10 tablet  0   No current facility-administered medications on file prior to visit.     REVIEW OF SYSTEMS: Cardiovascular: No chest pain, chest pressure, palpitations, orthopnea, or dyspnea on exertion. No claudication or rest pain,  No history of DVT or phlebitis. Pulmonary: No productive cough, asthma or wheezing. Neurologic: No weakness, paresthesias, aphasia, or amaurosis. No dizziness. Hematologic: No bleeding problems or clotting disorders. Musculoskeletal: No joint pain or joint swelling. Gastrointestinal: No blood in stool or hematemesis Genitourinary: No dysuria or hematuria. Psychiatric:: No history of major depression. Integumentary: Recent dog bite cut to the right hand/arm. Constitutional: No fever or chills.  PHYSICAL EXAMINATION:   Vital signs are BP 127/86  Pulse 74  Ht 5' (1.524 m)  Wt 191 lb (86.637 kg)  BMI 37.30 kg/m2  SpO2 100% General: The patient appears their stated age. HEENT:  No gross abnormalities Pulmonary:  Non labored breathing Musculoskeletal: There are no major deformities. Neurologic: No focal weakness or paresthesias are detected, Skin: Healing laceration to right arm Psychiatric: The patient has normal affect. Cardiovascular: Thrill present within the right arm fistula but somewhat difficult to feel   Diagnostic Studies I have reviewed her ultrasound study today.  This shows vein diameters ranging from 0.49 cm and greater.  There are several branches as well.  The  depth is about 2 cm.  Assessment: Non-maturing right brachiocephalic fistula Plan: I feel there has been improvement in the fistula following angioplasty of the vein near the arterial venous anastomosis.  I feel like the rate limiting stent now is the depth of the fistula.  The patient will be scheduled for elevation this coming Thursday, July 9.  Eldridge Abrahams, M.D. [facility]  Office: [phone] Pager:  [phone]

## 2014-01-07 ENCOUNTER — Encounter (HOSPITAL_COMMUNITY): Payer: Self-pay | Admitting: *Deleted

## 2014-01-07 MED ORDER — SODIUM CHLORIDE 0.9 % IV SOLN
INTRAVENOUS | Status: DC
  Administered 2014-01-08: 08:00:00 via INTRAVENOUS

## 2014-01-07 MED ORDER — DEXTROSE 5 % IV SOLN
1.5000 g | INTRAVENOUS | Status: AC
  Administered 2014-01-08: 1.5 g via INTRAVENOUS
  Filled 2014-01-07: qty 1.5

## 2014-01-08 ENCOUNTER — Ambulatory Visit (HOSPITAL_COMMUNITY)
Admission: RE | Admit: 2014-01-08 | Discharge: 2014-01-08 | Disposition: A | Discharge status: Home / Self Care | Payer: Medicare HMO | Source: Ambulatory Visit | Attending: Surgery | Admitting: Surgery

## 2014-01-08 ENCOUNTER — Encounter (HOSPITAL_COMMUNITY): Payer: Medicare HMO | Admitting: Anesthesiology

## 2014-01-08 ENCOUNTER — Ambulatory Visit (HOSPITAL_COMMUNITY): Payer: Medicare HMO | Admitting: Anesthesiology

## 2014-01-08 ENCOUNTER — Encounter (HOSPITAL_COMMUNITY)
Admission: RE | Disposition: A | Discharge status: Home / Self Care | Payer: Self-pay | Source: Ambulatory Visit | Attending: Surgery

## 2014-01-08 ENCOUNTER — Encounter (HOSPITAL_COMMUNITY): Payer: Self-pay | Admitting: *Deleted

## 2014-01-08 DIAGNOSIS — T82898A Other specified complication of vascular prosthetic devices, implants and grafts, initial encounter: Secondary | ICD-10-CM

## 2014-01-08 DIAGNOSIS — T82598A Other mechanical complication of other cardiac and vascular devices and implants, initial encounter: Secondary | ICD-10-CM | POA: Insufficient documentation

## 2014-01-08 DIAGNOSIS — K219 Gastro-esophageal reflux disease without esophagitis: Secondary | ICD-10-CM | POA: Insufficient documentation

## 2014-01-08 DIAGNOSIS — IMO0002 Reserved for concepts with insufficient information to code with codable children: Secondary | ICD-10-CM | POA: Insufficient documentation

## 2014-01-08 DIAGNOSIS — Z8673 Personal history of transient ischemic attack (TIA), and cerebral infarction without residual deficits: Secondary | ICD-10-CM | POA: Insufficient documentation

## 2014-01-08 DIAGNOSIS — E119 Type 2 diabetes mellitus without complications: Secondary | ICD-10-CM | POA: Insufficient documentation

## 2014-01-08 DIAGNOSIS — E7800 Pure hypercholesterolemia, unspecified: Secondary | ICD-10-CM | POA: Insufficient documentation

## 2014-01-08 DIAGNOSIS — I120 Hypertensive chronic kidney disease with stage 5 chronic kidney disease or end stage renal disease: Secondary | ICD-10-CM | POA: Diagnosis not present

## 2014-01-08 DIAGNOSIS — J449 Chronic obstructive pulmonary disease, unspecified: Secondary | ICD-10-CM | POA: Insufficient documentation

## 2014-01-08 DIAGNOSIS — Z7982 Long term (current) use of aspirin: Secondary | ICD-10-CM | POA: Insufficient documentation

## 2014-01-08 DIAGNOSIS — N186 End stage renal disease: Secondary | ICD-10-CM

## 2014-01-08 DIAGNOSIS — Y832 Surgical operation with anastomosis, bypass or graft as the cause of abnormal reaction of the patient, or of later complication, without mention of misadventure at the time of the procedure: Secondary | ICD-10-CM | POA: Insufficient documentation

## 2014-01-08 DIAGNOSIS — Z6837 Body mass index (BMI) 37.0-37.9, adult: Secondary | ICD-10-CM | POA: Insufficient documentation

## 2014-01-08 DIAGNOSIS — D649 Anemia, unspecified: Secondary | ICD-10-CM | POA: Insufficient documentation

## 2014-01-08 DIAGNOSIS — Q278 Other specified congenital malformations of peripheral vascular system: Secondary | ICD-10-CM | POA: Diagnosis not present

## 2014-01-08 DIAGNOSIS — J4489 Other specified chronic obstructive pulmonary disease: Secondary | ICD-10-CM | POA: Insufficient documentation

## 2014-01-08 DIAGNOSIS — X58XXXA Exposure to other specified factors, initial encounter: Secondary | ICD-10-CM | POA: Insufficient documentation

## 2014-01-08 DIAGNOSIS — Z794 Long term (current) use of insulin: Secondary | ICD-10-CM | POA: Insufficient documentation

## 2014-01-08 DIAGNOSIS — F411 Generalized anxiety disorder: Secondary | ICD-10-CM | POA: Insufficient documentation

## 2014-01-08 HISTORY — PX: FISTULA SUPERFICIALIZATION: SHX6341

## 2014-01-08 LAB — POCT I-STAT 4, (NA,K, GLUC, HGB,HCT)
Glucose, Bld: 149 mg/dL — ABNORMAL HIGH (ref 70–99)
HCT: 30 % — ABNORMAL LOW (ref 36.0–46.0)
Hemoglobin: 10.2 g/dL — ABNORMAL LOW (ref 12.0–15.0)
Potassium: 4.3 meq/L (ref 3.7–5.3)
Sodium: 146 meq/L (ref 137–147)

## 2014-01-08 LAB — GLUCOSE, CAPILLARY
GLUCOSE-CAPILLARY: 151 mg/dL — AB (ref 70–99)
GLUCOSE-CAPILLARY: 140 mg/dL — AB (ref 70–99)

## 2014-01-08 SURGERY — FISTULA SUPERFICIALIZATION
Anesthesia: Monitor Anesthesia Care | Site: Arm Upper | Laterality: Right

## 2014-01-08 MED ORDER — CEPHALEXIN 500 MG PO CAPS: 500.0000 mg | ORAL_CAPSULE | Freq: Three times a day (TID) | ORAL | Status: DC

## 2014-01-08 MED ORDER — MIDAZOLAM HCL 5 MG/5ML IJ SOLN
INTRAMUSCULAR | Status: DC | PRN
  Administered 2014-01-08: 1 mg via INTRAVENOUS

## 2014-01-08 MED ORDER — METOCLOPRAMIDE HCL 5 MG/ML IJ SOLN: 10.0000 mg | Freq: Once | INTRAMUSCULAR | Status: DC | PRN

## 2014-01-08 MED ORDER — CHLORHEXIDINE GLUCONATE CLOTH 2 % EX PADS: 6.0000 | MEDICATED_PAD | Freq: Once | CUTANEOUS | Status: DC

## 2014-01-08 MED ORDER — FENTANYL CITRATE 0.05 MG/ML IJ SOLN: 25.0000 ug | INTRAMUSCULAR | Status: DC | PRN

## 2014-01-08 MED ORDER — ONDANSETRON HCL 4 MG/2ML IJ SOLN
INTRAMUSCULAR | Status: AC
  Filled 2014-01-08: qty 2

## 2014-01-08 MED ORDER — ONDANSETRON HCL 4 MG/2ML IJ SOLN
INTRAMUSCULAR | Status: DC | PRN
  Administered 2014-01-08: 4 mg via INTRAVENOUS

## 2014-01-08 MED ORDER — MIDAZOLAM HCL 2 MG/2ML IJ SOLN
INTRAMUSCULAR | Status: AC
  Filled 2014-01-08: qty 2

## 2014-01-08 MED ORDER — PROPOFOL 10 MG/ML IV BOLUS
INTRAVENOUS | Status: AC
  Filled 2014-01-08: qty 20

## 2014-01-08 MED ORDER — FENTANYL CITRATE 0.05 MG/ML IJ SOLN
INTRAMUSCULAR | Status: AC
  Filled 2014-01-08: qty 5

## 2014-01-08 MED ORDER — 0.9 % SODIUM CHLORIDE (POUR BTL) OPTIME
TOPICAL | Status: DC | PRN
  Administered 2014-01-08: 1000 mL

## 2014-01-08 MED ORDER — SODIUM CHLORIDE 0.9 % IV SOLN
INTRAVENOUS | Status: DC | PRN
  Administered 2014-01-08: 09:00:00 via INTRAVENOUS

## 2014-01-08 MED ORDER — PROPOFOL INFUSION 10 MG/ML OPTIME
INTRAVENOUS | Status: DC | PRN
  Administered 2014-01-08: 25 ug/kg/min via INTRAVENOUS

## 2014-01-08 MED ORDER — DEXTROSE 5 % IV SOLN
INTRAVENOUS | Status: DC | PRN
  Administered 2014-01-08: 10:00:00 via INTRAVENOUS

## 2014-01-08 MED ORDER — OXYCODONE HCL 5 MG PO TABS
5.0000 mg | ORAL_TABLET | Freq: Four times a day (QID) | ORAL | Status: DC | PRN
Start: 2014-01-08 — End: 2014-04-09

## 2014-01-08 MED ORDER — LIDOCAINE-EPINEPHRINE (PF) 1 %-1:200000 IJ SOLN
INTRAMUSCULAR | Status: DC | PRN
  Administered 2014-01-08: 30 mL

## 2014-01-08 MED ORDER — FENTANYL CITRATE 0.05 MG/ML IJ SOLN
INTRAMUSCULAR | Status: DC | PRN
  Administered 2014-01-08 (×3): 50 ug via INTRAVENOUS

## 2014-01-08 SURGICAL SUPPLY — 35 items
ADH SKN CLS APL DERMABOND .7 (GAUZE/BANDAGES/DRESSINGS) ×1
BLADE 10 SAFETY STRL DISP (BLADE) ×1 IMPLANT
CANISTER SUCTION 2500CC (MISCELLANEOUS) ×2 IMPLANT
CLIP TI MEDIUM 6 (CLIP) ×2 IMPLANT
CLIP TI WIDE RED SMALL 6 (CLIP) ×3 IMPLANT
COVER PROBE W GEL 5X96 (DRAPES) ×1 IMPLANT
COVER SURGICAL LIGHT HANDLE (MISCELLANEOUS) ×2 IMPLANT
DERMABOND ADVANCED (GAUZE/BANDAGES/DRESSINGS) ×1
DERMABOND ADVANCED .7 DNX12 (GAUZE/BANDAGES/DRESSINGS) ×1 IMPLANT
ELECT REM PT RETURN 9FT ADLT (ELECTROSURGICAL) ×2
ELECTRODE REM PT RTRN 9FT ADLT (ELECTROSURGICAL) ×1 IMPLANT
GEL ULTRASOUND 20GR AQUASONIC (MISCELLANEOUS) IMPLANT
GLOVE BIO SURGEON STRL SZ 6.5 (GLOVE) ×2 IMPLANT
GLOVE SS BIOGEL STRL SZ 7 (GLOVE) ×1 IMPLANT
GLOVE SUPERSENSE BIOGEL SZ 7 (GLOVE) ×1
GLOVE SURG SS PI 7.0 STRL IVOR (GLOVE) ×2 IMPLANT
GOWN STRL REUS W/ TWL LRG LVL3 (GOWN DISPOSABLE) ×3 IMPLANT
GOWN STRL REUS W/ TWL XL LVL3 (GOWN DISPOSABLE) IMPLANT
GOWN STRL REUS W/TWL LRG LVL3 (GOWN DISPOSABLE) ×6
GOWN STRL REUS W/TWL XL LVL3 (GOWN DISPOSABLE) ×2
KIT BASIN OR (CUSTOM PROCEDURE TRAY) ×2 IMPLANT
KIT ROOM TURNOVER OR (KITS) ×2 IMPLANT
NS IRRIG 1000ML POUR BTL (IV SOLUTION) ×2 IMPLANT
PACK CV ACCESS (CUSTOM PROCEDURE TRAY) ×2 IMPLANT
PAD ARMBOARD 7.5X6 YLW CONV (MISCELLANEOUS) ×4 IMPLANT
SPONGE GAUZE 4X4 12PLY (GAUZE/BANDAGES/DRESSINGS) ×2 IMPLANT
SUT PROLENE 6 0 BV (SUTURE) ×2 IMPLANT
SUT VIC AB 3-0 SH 27 (SUTURE) ×4
SUT VIC AB 3-0 SH 27X BRD (SUTURE) ×1 IMPLANT
SUT VIC AB 3-0 SH 8-18 (SUTURE) ×1 IMPLANT
SUT VICRYL 4-0 PS2 18IN ABS (SUTURE) ×1 IMPLANT
TOWEL OR 17X24 6PK STRL BLUE (TOWEL DISPOSABLE) ×2 IMPLANT
TOWEL OR 17X26 10 PK STRL BLUE (TOWEL DISPOSABLE) ×2 IMPLANT
UNDERPAD 30X30 INCONTINENT (UNDERPADS AND DIAPERS) ×2 IMPLANT
WATER STERILE IRR 1000ML POUR (IV SOLUTION) ×2 IMPLANT

## 2014-01-08 NOTE — Anesthesia Postprocedure Evaluation (Signed)
Anesthesia Post Note  Patient: Savannah Lindsey  Procedure(s) Performed: Procedure(s) (LRB): FISTULA SUPERFICIALIZATION (Right)  Anesthesia type: MAC  Patient location: PACU  Post pain: Pain level controlled  Post assessment: Patient's Cardiovascular Status Stable  Last Vitals:  Filed Vitals:   01/08/14 1130  BP: 133/66  Pulse: 65  Temp:   Resp: 15    Post vital signs: Reviewed and stable  Level of consciousness: alert  Complications: No apparent anesthesia complications

## 2014-01-08 NOTE — Interval H&P Note (Signed)
History and Physical Interval Note:  01/08/2014 9:04 AM  Savannah Lindsey  has presented today for surgery, with the diagnosis of End stage renal disease   The various methods of treatment have been discussed with the patient and family. After consideration of risks, benefits and other options for treatment, the patient has consented to  Procedure(s): FISTULA SUPERFICIALIZATION (Right) as a surgical intervention .  The patient's history has been reviewed, patient examined, no change in status, stable for surgery.  I have reviewed the patient's chart and labs.  Questions were answered to the patient's satisfaction.     Tinnie Gens

## 2014-01-08 NOTE — Discharge Instructions (Signed)
° ° °  01/08/2014 CEIRRA BELLI [id number] 1945-08-21  Surgeon(s): Mal Misty, MD  Procedure(s): FISTULA SUPERFICIALIZATION right upper arm fistula  x Do not stick graft for 12 weeks   What to eat:  For your first meals, you should eat lightly; only small meals initially.  If you do not have nausea, you may eat larger meals.  Avoid spicy, greasy and heavy food.    General Anesthesia, Adult, Care After  Refer to this sheet in the next few weeks. These instructions provide you with information on caring for yourself after your procedure. Your health care provider may also give you more specific instructions. Your treatment has been planned according to current medical practices, but problems sometimes occur. Call your health care provider if you have any problems or questions after your procedure.  WHAT TO EXPECT AFTER THE PROCEDURE  After the procedure, it is typical to experience:  Sleepiness.  Nausea and vomiting. HOME CARE INSTRUCTIONS  For the first 24 hours after general anesthesia:  Have a responsible person with you.  Do not drive a car. If you are alone, do not take public transportation.  Do not drink alcohol.  Do not take medicine that has not been prescribed by your health care provider.  Do not sign important papers or make important decisions.  You may resume a normal diet and activities as directed by your health care provider.  Change bandages (dressings) as directed.  If you have questions or problems that seem related to general anesthesia, call the hospital and ask for the anesthetist or anesthesiologist on call. SEEK MEDICAL CARE IF:  You have nausea and vomiting that continue the day after anesthesia.  You develop a rash. SEEK IMMEDIATE MEDICAL CARE IF:  You have difficulty breathing.  You have chest pain.  You have any allergic problems. Document Released: 09/25/2000 Document Revised: 02/19/2013 Document Reviewed: 01/02/2013  [facility] Patient  Information 2014 [place], [place].

## 2014-01-08 NOTE — Transfer of Care (Signed)
Immediate Anesthesia Transfer of Care Note  Patient: Savannah Lindsey  Procedure(s) Performed: Procedure(s): FISTULA SUPERFICIALIZATION (Right)  Patient Location: PACU  Anesthesia Type:MAC  Level of Consciousness: awake, alert , oriented and patient cooperative  Airway & Oxygen Therapy: Patient Spontanous Breathing and Patient connected to nasal cannula oxygen  Post-op Assessment: Report given to PACU RN and Post -op Vital signs reviewed and stable  Post vital signs: Reviewed and stable  Complications: No apparent anesthesia complications

## 2014-01-08 NOTE — Addendum Note (Signed)
Addendum created 01/08/14 1506 by Terrill Mohr, CRNA   Modules edited: Anesthesia Medication Administration

## 2014-01-08 NOTE — H&P (View-Only) (Signed)
Patient name: Savannah Lindsey MRN: [id number] DOB: 12/23/1945 Sex: female     Chief Complaint  Patient presents with  . Re-evaluation    6 wk f/u duplex of fistula    HISTORY OF PRESENT ILLNESS: The patient is back today for followup.  She is status post right brachiocephalic fistula creation on 08/14/2013.  She then underwent venoplasty of the vein near the arterial venous anastomosis on May 20.  She's not yet on dialysis.  Past Medical History  Diagnosis Date  . Hypertension   . COPD (chronic obstructive pulmonary disease)   . GERD (gastroesophageal reflux disease)   . Hypercholesteremia   . Anxiety   . Stroke   . Headaches due to old head injury   . Blind one eye   . Angina   . Shortness of breath   . Pneumonia   . Recurrent upper respiratory infection (URI)   . Anemia   . Arthritis   . Complication of anesthesia     difficulty waking up  . Chronic kidney disease   . Cancer     skin cancer right thumb  . Temporal arteritis 11/17/2011  . Gastric polyp     antral  . Stenosis of esophagus   . Diabetes mellitus     TYpe 2 IDDM x 11 years  . Bursitis     Past Surgical History  Procedure Laterality Date  . Abdominal hysterectomy    . Cholecystectomy    . Heel spur surgery      bilateral  . Esophagogastroduodenoscopy  11/18/2011    Procedure: ESOPHAGOGASTRODUODENOSCOPY (EGD);  Surgeon: Gatha Mayer, MD;  Location: [facility] ENDOSCOPY;  Service: Endoscopy;  Laterality: N/A;  . Eye surgery      cataracts/IOL  . Brain surgery      temporal arteritis  . Av fistula placement Right 08/14/2013    Procedure: ARTERIOVENOUS (AV) FISTULA CREATION;  Surgeon: Serafina Mitchell, MD;  Location: [place];  Service: Vascular;  Laterality: Right;  . Av fistula placement Right 08/14/2013    Procedure:  CREATION BRACHIOCEPHALIC ARTERIOVENOUS (AV) FISTULA;  Surgeon: Serafina Mitchell, MD;  Location: [place];  Service: Vascular;  Laterality: Right;    History   Social History  . Marital  Status: Widowed    Spouse Name: N/A    Number of Children: N/A  . Years of Education: N/A   Occupational History  . Not on file.   Social History Main Topics  . Smoking status: Never Smoker   . Smokeless tobacco: Never Used  . Alcohol Use: No  . Drug Use: No  . Sexual Activity: No   Other Topics Concern  . Not on file   Social History Narrative  . No narrative on file    Family History  Problem Relation Age of Onset  . Colon cancer    . Diabetes Brother   . Hypertension Mother   . COPD Father   . Diabetes Father     Allergies as of 01/05/2014 - Review Complete 01/05/2014  Allergen Reaction Noted  . Codeine Itching and Nausea And Vomiting 11/16/2011    Current Outpatient Prescriptions on File Prior to Visit  Medication Sig Dispense Refill  . Acetaminophen (TYLENOL PO) Take 2 tablets by mouth every 6 (six) hours as needed (pain).      Marland Kitchen albuterol (PROVENTIL) (2.5 MG/3ML) 0.083% nebulizer solution Take 2.5 mg by nebulization every 6 (six) hours as needed for wheezing. For wheezing      .  aspirin 325 MG tablet Take 325 mg by mouth daily.      . ciprofloxacin (CIPRO) 500 MG tablet Take 1 tablet (500 mg total) by mouth 2 (two) times daily.  28 tablet  0  . furosemide (LASIX) 40 MG tablet Take 40 mg by mouth daily.      . insulin aspart (NOVOLOG) 100 UNIT/ML injection Inject 13-16 Units into the skin 4 (four) times daily -  before meals and at bedtime. Per sliding scale      . insulin detemir (LEVEMIR) 100 UNIT/ML injection Inject 36 Units into the skin at bedtime.       Marland Kitchen loratadine (CLARITIN) 10 MG tablet Take 10 mg by mouth every morning.       Marland Kitchen losartan (COZAAR) 25 MG tablet Take 25 mg by mouth daily.      Marland Kitchen lubiprostone (AMITIZA) 24 MCG capsule Take 24 mcg by mouth daily with breakfast.      . metroNIDAZOLE (FLAGYL) 500 MG tablet Take 1 tablet (500 mg total) by mouth 3 (three) times daily.  42 tablet  0  . pantoprazole (PROTONIX) 40 MG tablet Take 40 mg by mouth every  morning.      . potassium chloride (K-DUR) 10 MEQ tablet Take 10 mEq by mouth every morning.      . diazepam (VALIUM) 5 MG tablet Take 1 tablet (5 mg total) by mouth every 6 (six) hours as needed (spasms).  20 tablet  0  . oxyCODONE-acetaminophen (PERCOCET/ROXICET) 5-325 MG per tablet Take 1 tablet by mouth every 8 (eight) hours as needed for moderate pain or severe pain.  7 tablet  0  . predniSONE (DELTASONE) 20 MG tablet Take 1 tablet (20 mg total) by mouth 2 (two) times daily.  10 tablet  0   No current facility-administered medications on file prior to visit.     REVIEW OF SYSTEMS: Cardiovascular: No chest pain, chest pressure, palpitations, orthopnea, or dyspnea on exertion. No claudication or rest pain,  No history of DVT or phlebitis. Pulmonary: No productive cough, asthma or wheezing. Neurologic: No weakness, paresthesias, aphasia, or amaurosis. No dizziness. Hematologic: No bleeding problems or clotting disorders. Musculoskeletal: No joint pain or joint swelling. Gastrointestinal: No blood in stool or hematemesis Genitourinary: No dysuria or hematuria. Psychiatric:: No history of major depression. Integumentary: Recent dog bite cut to the right hand/arm. Constitutional: No fever or chills.  PHYSICAL EXAMINATION:   Vital signs are BP 127/86  Pulse 74  Ht 5' (1.524 m)  Wt 191 lb (86.637 kg)  BMI 37.30 kg/m2  SpO2 100% General: The patient appears their stated age. HEENT:  No gross abnormalities Pulmonary:  Non labored breathing Musculoskeletal: There are no major deformities. Neurologic: No focal weakness or paresthesias are detected, Skin: Healing laceration to right arm Psychiatric: The patient has normal affect. Cardiovascular: Thrill present within the right arm fistula but somewhat difficult to feel   Diagnostic Studies I have reviewed her ultrasound study today.  This shows vein diameters ranging from 0.49 cm and greater.  There are several branches as well.  The  depth is about 2 cm.  Assessment: Non-maturing right brachiocephalic fistula Plan: I feel there has been improvement in the fistula following angioplasty of the vein near the arterial venous anastomosis.  I feel like the rate limiting stent now is the depth of the fistula.  The patient will be scheduled for elevation this coming Thursday, July 9.  Eldridge Abrahams, M.D. [facility]  Office: [phone] Pager:  [phone]

## 2014-01-08 NOTE — Anesthesia Preprocedure Evaluation (Addendum)
Anesthesia Evaluation  Patient identified by MRN, date of birth, ID band Patient awake    Reviewed: Allergy & Precautions, H&P , NPO status , Patient's Chart, lab work & pertinent test results, reviewed documented beta blocker date and time   History of Anesthesia Complications (+) PROLONGED EMERGENCE and history of anesthetic complications  Airway Mallampati: II TM Distance: >3 FB Neck ROM: full    Dental  (+) Edentulous Upper, Edentulous Lower   Pulmonary shortness of breath and with exertion, pneumonia -, resolved, COPD COPD inhaler and oxygen dependent, Recent URI , Resolved,  breath sounds clear to auscultation        Cardiovascular hypertension, On Medications + angina + Peripheral Vascular Disease Rhythm:regular     Neuro/Psych  Headaches, CVA negative psych ROS   GI/Hepatic Neg liver ROS, GERD-  Medicated and Controlled,  Endo/Other  diabetes, Well Controlled, Type 2, Insulin Dependent, Oral Hypoglycemic AgentsMorbid obesity  Renal/GU ESRFRenal disease  negative genitourinary   Musculoskeletal   Abdominal   Peds  Hematology  (+) anemia ,   Anesthesia Other Findings See surgeon's H&P   Reproductive/Obstetrics negative OB ROS                          Anesthesia Physical Anesthesia Plan  ASA: III  Anesthesia Plan: MAC   Post-op Pain Management:    Induction: Intravenous  Airway Management Planned: Simple Face Mask  Additional Equipment:   Intra-op Plan:   Post-operative Plan:   Informed Consent: I have reviewed the patients History and Physical, chart, labs and discussed the procedure including the risks, benefits and alternatives for the proposed anesthesia with the patient or authorized representative who has indicated his/her understanding and acceptance.   Dental Advisory Given  Plan Discussed with: CRNA and Surgeon  Anesthesia Plan Comments:         Anesthesia  Quick Evaluation

## 2014-01-08 NOTE — Op Note (Signed)
OPERATIVE REPORT  Date of Surgery: 01/08/2014  Surgeon: Tinnie Gens, MD  Assistant: Dionicio Stall  Pre-op Diagnosis: End stage renal disease   Post-op Diagnosis: End stage renal disease   Procedure: Procedure(s): Superficialization of right brachial to cephalic A-V fistula   Anesthesia: MAC  EBL: Minimal  Complications: None  Procedure Details: Patient to the operating room placed in supine position at which time the right upper extremity was prepped with Betadine scrub and solution draped routine sterile manner. The right brachial-cephalic fistula was then marked on the skin surface. After infiltration with 1% Xylocaine with epinephrine 2 incisions were made one in the distal upper arm and one in the proximal upper arm directly over the fistula. The fistula itself was carefully dissected free and there were 3 or 4 significant branches which were ligated with 3-0 silk ties and divided. It was circumferentially mobilized. Adequate hemostasis was achieved. The tissue deep to the fistula was then approximated with interrupted 3-0 Vicryl sutures to bring the vein closer to the surface. Approximately 8 of these interrupted sutures were placed. Following this the skin was reapproximated in 2 layers of 3-0 Vicryl and 4-0 Vicryl in a subcuticular fashion at the fistula directly beneath the skin an easily palpable. There was good pulse and palpable thrill at the end of the procedure. Dermabond was also applied to the wounds the patient to the recovery room in stable condition   Tinnie Gens, MD 01/08/2014 10:25 AM

## 2014-01-08 NOTE — Addendum Note (Signed)
Addendum created 01/08/14 1504 by Terrill Mohr, CRNA   Modules edited: Anesthesia Medication Administration

## 2014-01-08 NOTE — Progress Notes (Signed)
  [facility] Day [facility] Note  Subjective:  C/o of pain over dog scratch.  Filed Vitals:   01/08/14 1130  BP: 133/66  Pulse: 65  Temp:   Resp: 15    Incisions:  C/d/i.  +thrill within the fistula.  Dog scratch just proximal to the wrist.  No bleeding or drainage.  Tender to touch. Extremities:  + palpable right radial pulse   Assessment/Plan:  This is a 68 y.o. female who is s/p Superficialization of right brachial to cephalic A-V fistula   -pt has dog scratch on right arm just proximal to wrist .  Called to evaluate.  It does not appear infected.  She denies fevers and she states it has not changed (better or worsened).  Will prescribed Keflex and have her f/u with her PCP in the next couple of days to evaluate it to make sure it is not worsening.  She will f/u with Dr. Kellie Lindsey in 3 weeks.  Rx given:  Oxycodone 5 mg q6h prn pain #15 (fifteen)  & Keflex 500 mg tid   Savannah Locket, PA-C 01/08/2014 11:33 AM

## 2014-01-09 ENCOUNTER — Encounter (HOSPITAL_COMMUNITY): Payer: Self-pay | Admitting: Vascular Surgery

## 2014-01-09 ENCOUNTER — Telehealth: Payer: Self-pay | Admitting: Vascular Surgery

## 2014-01-09 NOTE — Telephone Encounter (Addendum)
Message copied by Gena Fray on Fri Jan 09, 2014  2:24 PM ------      Message from: Denman George      Created: Thu Jan 08, 2014  3:29 PM      Regarding: needs 3 wk f/u w/ [facility]                   ----- Message -----         From: Gabriel Earing, PA-C         Sent: 01/08/2014  10:26 AM           To: [facility] Charge Pool            S/p superficialization of right UA AVF 01/08/14.  F/u with Dr. Kellie Simmering in 3 weeks.  She does not need a duplex.            Thanks ------  01/09/14: lm for pt re appt, dpm

## 2014-01-21 ENCOUNTER — Telehealth: Payer: Self-pay

## 2014-01-21 ENCOUNTER — Telehealth: Payer: Self-pay | Admitting: Family

## 2014-01-21 NOTE — Telephone Encounter (Signed)
rec'd phone call from son (via sign language interpreter).  Reported pt. Is asking about refill on pain medication and antibiotic.  Advised the son that based on the progress note of 7/9, the patient was advised to f/u with PCP to have the dog scratch on right forearm checked; further antibiotics would need to be prescribed by the PCP if needed.  Also advised that narcotic pain medication is not typically refilled after this procedure, unless pt. is evaluated in the office.  The son reported pt. fell on her surgical arm approx. 4 days ago; reported c/o pain in the outer aspect of the right upper arm.  Denies any opening in incision line; denies redness or drainage in incisional area.  Reported there is swelling and warmth in the incision.  Appt. Given for 9:00 AM 01/22/14; agrees w/ plan.  Requested a sign language interpreter for the appt.

## 2014-01-21 NOTE — Telephone Encounter (Signed)
Please add on to NP schedule @ 9:00 AM 7/23; s/p superficialization right B-C AVF 01/08/14; reported fell on surgical arm 4 days ago; c/o pain , swelling and warmth. Requested a sign language interpreter for the appt. Due to son being deaf.

## 2014-01-22 ENCOUNTER — Encounter: Payer: Medicare HMO | Admitting: Family

## 2014-02-02 ENCOUNTER — Encounter (HOSPITAL_COMMUNITY): Payer: Medicare HMO

## 2014-02-02 ENCOUNTER — Encounter: Payer: Self-pay | Admitting: Vascular Surgery

## 2014-02-03 ENCOUNTER — Encounter: Payer: Medicare HMO | Admitting: Vascular Surgery

## 2014-02-04 ENCOUNTER — Encounter (HOSPITAL_COMMUNITY)
Admission: RE | Admit: 2014-02-04 | Discharge: 2014-02-04 | Disposition: A | Discharge status: Home / Self Care | Payer: Medicare HMO | Source: Ambulatory Visit | Attending: Nephrology | Admitting: Nephrology

## 2014-02-04 DIAGNOSIS — D631 Anemia in chronic kidney disease: Secondary | ICD-10-CM | POA: Insufficient documentation

## 2014-02-04 DIAGNOSIS — N039 Chronic nephritic syndrome with unspecified morphologic changes: Principal | ICD-10-CM

## 2014-02-04 DIAGNOSIS — I120 Hypertensive chronic kidney disease with stage 5 chronic kidney disease or end stage renal disease: Secondary | ICD-10-CM | POA: Diagnosis not present

## 2014-02-04 DIAGNOSIS — N186 End stage renal disease: Secondary | ICD-10-CM | POA: Diagnosis not present

## 2014-02-04 LAB — POCT HEMOGLOBIN-HEMACUE: Hemoglobin: 9.5 g/dL — ABNORMAL LOW (ref 12.0–15.0)

## 2014-02-04 LAB — IRON AND TIBC
IRON: 79 ug/dL (ref 42–135)
Saturation Ratios: 42 % (ref 20–55)
TIBC: 189 ug/dL — ABNORMAL LOW (ref 250–470)
UIBC: 110 ug/dL — AB (ref 125–400)

## 2014-02-04 LAB — FERRITIN: Ferritin: 549 ng/mL — ABNORMAL HIGH (ref 10–291)

## 2014-02-04 MED ORDER — EPOETIN ALFA 20000 UNIT/ML IJ SOLN
INTRAMUSCULAR | Status: AC
  Administered 2014-02-04: 20000 [IU]
  Filled 2014-02-04: qty 1

## 2014-02-04 MED ORDER — EPOETIN ALFA 20000 UNIT/ML IJ SOLN: 20000.0000 [IU] | INTRAMUSCULAR | Status: DC

## 2014-02-04 NOTE — Discharge Instructions (Signed)

## 2014-02-25 ENCOUNTER — Encounter (HOSPITAL_COMMUNITY)
Admission: RE | Admit: 2014-02-25 | Discharge: 2014-02-25 | Disposition: A | Discharge status: Home / Self Care | Payer: Medicare HMO | Source: Ambulatory Visit | Attending: Nephrology | Admitting: Nephrology

## 2014-02-25 DIAGNOSIS — D631 Anemia in chronic kidney disease: Secondary | ICD-10-CM | POA: Diagnosis not present

## 2014-02-25 LAB — POCT HEMOGLOBIN-HEMACUE: Hemoglobin: 10.2 g/dL — ABNORMAL LOW (ref 12.0–15.0)

## 2014-02-25 MED ORDER — EPOETIN ALFA 20000 UNIT/ML IJ SOLN
INTRAMUSCULAR | Status: AC
  Administered 2014-02-25: 20000 [IU] via SUBCUTANEOUS
  Filled 2014-02-25: qty 1

## 2014-02-25 MED ORDER — EPOETIN ALFA 20000 UNIT/ML IJ SOLN
20000.0000 [IU] | INTRAMUSCULAR | Status: DC
  Administered 2014-02-25: 20000 [IU] via SUBCUTANEOUS

## 2014-03-04 ENCOUNTER — Encounter (HOSPITAL_COMMUNITY): Payer: Medicare HMO

## 2014-03-18 ENCOUNTER — Encounter (HOSPITAL_COMMUNITY)
Admission: RE | Admit: 2014-03-18 | Discharge: 2014-03-18 | Disposition: A | Discharge status: Home / Self Care | Payer: Medicare HMO | Source: Ambulatory Visit | Attending: Nephrology | Admitting: Nephrology

## 2014-03-18 DIAGNOSIS — D631 Anemia in chronic kidney disease: Secondary | ICD-10-CM | POA: Insufficient documentation

## 2014-03-18 DIAGNOSIS — I120 Hypertensive chronic kidney disease with stage 5 chronic kidney disease or end stage renal disease: Secondary | ICD-10-CM | POA: Diagnosis not present

## 2014-03-18 DIAGNOSIS — N039 Chronic nephritic syndrome with unspecified morphologic changes: Principal | ICD-10-CM

## 2014-03-18 DIAGNOSIS — N186 End stage renal disease: Secondary | ICD-10-CM | POA: Insufficient documentation

## 2014-03-18 LAB — IRON AND TIBC
IRON: 92 ug/dL (ref 42–135)
Saturation Ratios: 42 % (ref 20–55)
TIBC: 219 ug/dL — AB (ref 250–470)
UIBC: 127 ug/dL (ref 125–400)

## 2014-03-18 LAB — FERRITIN: FERRITIN: 629 ng/mL — AB (ref 10–291)

## 2014-03-18 LAB — POCT HEMOGLOBIN-HEMACUE: HEMOGLOBIN: 11.4 g/dL — AB (ref 12.0–15.0)

## 2014-03-18 MED ORDER — EPOETIN ALFA 20000 UNIT/ML IJ SOLN
INTRAMUSCULAR | Status: AC
  Filled 2014-03-18: qty 1

## 2014-03-18 MED ORDER — EPOETIN ALFA 20000 UNIT/ML IJ SOLN
20000.0000 [IU] | INTRAMUSCULAR | Status: DC
  Administered 2014-03-18: 20000 [IU] via SUBCUTANEOUS

## 2014-04-06 ENCOUNTER — Encounter: Payer: Self-pay | Admitting: Vascular Surgery

## 2014-04-06 ENCOUNTER — Other Ambulatory Visit: Payer: Self-pay

## 2014-04-06 DIAGNOSIS — Z09 Encounter for follow-up examination after completed treatment for conditions other than malignant neoplasm: Secondary | ICD-10-CM

## 2014-04-06 DIAGNOSIS — N186 End stage renal disease: Secondary | ICD-10-CM

## 2014-04-07 ENCOUNTER — Encounter: Payer: Self-pay | Admitting: Vascular Surgery

## 2014-04-07 ENCOUNTER — Ambulatory Visit (INDEPENDENT_AMBULATORY_CARE_PROVIDER_SITE_OTHER): Payer: Medicare HMO | Admitting: Vascular Surgery

## 2014-04-07 ENCOUNTER — Other Ambulatory Visit: Payer: Self-pay

## 2014-04-07 ENCOUNTER — Ambulatory Visit (HOSPITAL_COMMUNITY)
Admission: RE | Admit: 2014-04-07 | Discharge: 2014-04-07 | Disposition: A | Discharge status: Home / Self Care | Payer: Medicare HMO | Source: Ambulatory Visit | Attending: Vascular Surgery | Admitting: Vascular Surgery

## 2014-04-07 VITALS — BP 143/83 | HR 78 | Resp 18 | Ht 60.0 in | Wt 194.0 lb

## 2014-04-07 DIAGNOSIS — N186 End stage renal disease: Secondary | ICD-10-CM

## 2014-04-07 DIAGNOSIS — Z09 Encounter for follow-up examination after completed treatment for conditions other than malignant neoplasm: Secondary | ICD-10-CM

## 2014-04-07 NOTE — Progress Notes (Signed)
Subjective:     Patient ID: Savannah Lindsey, female   DOB: 06-08-1946, 68 y.o.   MRN: [id number]  HPI this 68 year old female returns were continued followup regarding her right brachial-cephalic AV fistula which was created in February of 2015. Again required ligation of branches and superficaization by me in July of 2015. She has not returned for followup. She returns today with the fistula occluded for an unknown duration of time probably several weeks. She is right-handed. She has had previous vein mapping which has revealed poor veins in both upper extremities. She denies any prior numbness in her right hand.   Review of Systems     Objective:   Physical Exam BP 143/83  Pulse 78  Resp 18  Ht 5' (1.524 m)  Wt 194 lb (87.998 kg)  BMI 37.89 kg/m2  General well-developed well-nourished female no apparent stress alert and oriented x3 Lungs no rhonchi or wheezing Cardiovascular regular rhythm no murmurs Abdomen obese no palpable masses Right upper extremity with well-healed antecubital and upper arm incisions. No pulsatile or palpable thrill in fistula. 1-2+ radial pulse palpable.  Today I ordered a duplex scan of the fistula which confirmed that there was no flow in it it was occluded and there was triphasic flow in the brachial artery. I also performed an independent bedside SonoSite ultrasound exam which revealed that there is a patent right basilic vein which gets small at the antecubital area.      Assessment:     Discussed situation with patient and her son who is deaf with the help of an interpreter. Explained that the success rate is related to how large and what quality the vein is.    Plan:     Will make one final attempt at right upper extremity fistula-right basilic vein transposition on Thursday of this week-October 8 If this fails patient will need AV graft

## 2014-04-08 ENCOUNTER — Encounter (HOSPITAL_COMMUNITY): Payer: Self-pay | Admitting: *Deleted

## 2014-04-08 ENCOUNTER — Encounter (HOSPITAL_COMMUNITY): Payer: Medicare HMO

## 2014-04-08 ENCOUNTER — Encounter (HOSPITAL_COMMUNITY)
Admission: RE | Admit: 2014-04-08 | Discharge: 2014-04-08 | Disposition: A | Discharge status: Home / Self Care | Payer: Commercial Managed Care - HMO | Source: Ambulatory Visit | Attending: Nephrology | Admitting: Nephrology

## 2014-04-08 DIAGNOSIS — N186 End stage renal disease: Secondary | ICD-10-CM | POA: Diagnosis not present

## 2014-04-08 DIAGNOSIS — D631 Anemia in chronic kidney disease: Secondary | ICD-10-CM | POA: Diagnosis present

## 2014-04-08 DIAGNOSIS — I120 Hypertensive chronic kidney disease with stage 5 chronic kidney disease or end stage renal disease: Secondary | ICD-10-CM | POA: Insufficient documentation

## 2014-04-08 LAB — POCT HEMOGLOBIN-HEMACUE: Hemoglobin: 11 g/dL — ABNORMAL LOW (ref 12.0–15.0)

## 2014-04-08 MED ORDER — CEFUROXIME SODIUM 1.5 G IJ SOLR
1.5000 g | INTRAMUSCULAR | Status: DC
  Filled 2014-04-08: qty 1.5

## 2014-04-08 MED ORDER — EPOETIN ALFA 20000 UNIT/ML IJ SOLN: 20000.0000 [IU] | INTRAMUSCULAR | Status: DC

## 2014-04-08 MED ORDER — EPOETIN ALFA 20000 UNIT/ML IJ SOLN
INTRAMUSCULAR | Status: AC
  Administered 2014-04-08: 20000 [IU] via SUBCUTANEOUS
  Filled 2014-04-08: qty 1

## 2014-04-08 NOTE — Progress Notes (Signed)
Pt stated that she does not take any morning medications.

## 2014-04-08 NOTE — Progress Notes (Signed)
04/08/14 1723  OBSTRUCTIVE SLEEP APNEA  Have you ever been diagnosed with sleep apnea through a sleep study? No  Do you snore loudly (loud enough to be heard through closed doors)?  0  Do you often feel tired, fatigued, or sleepy during the daytime? 1  Has anyone observed you stop breathing during your sleep? 0  Do you have, or are you being treated for high blood pressure? 1  BMI more than 35 kg/m2? 1  Age over 69 years old? 1  Gender: 0  Obstructive Sleep Apnea Score 4

## 2014-04-09 ENCOUNTER — Other Ambulatory Visit: Payer: Self-pay

## 2014-04-09 ENCOUNTER — Ambulatory Visit (HOSPITAL_COMMUNITY)
Admission: RE | Admit: 2014-04-09 | Discharge: 2014-04-09 | Disposition: A | Discharge status: Home / Self Care | Payer: Medicare HMO | Source: Ambulatory Visit | Attending: Vascular Surgery | Admitting: Vascular Surgery

## 2014-04-09 ENCOUNTER — Ambulatory Visit (HOSPITAL_COMMUNITY): Payer: Medicare HMO | Admitting: Certified Registered Nurse Anesthetist

## 2014-04-09 ENCOUNTER — Encounter (HOSPITAL_COMMUNITY): Payer: Self-pay | Admitting: *Deleted

## 2014-04-09 ENCOUNTER — Encounter (HOSPITAL_COMMUNITY)
Admission: RE | Disposition: A | Discharge status: Home / Self Care | Payer: Self-pay | Source: Ambulatory Visit | Attending: Vascular Surgery

## 2014-04-09 ENCOUNTER — Encounter (HOSPITAL_COMMUNITY): Payer: Medicare HMO | Admitting: Certified Registered Nurse Anesthetist

## 2014-04-09 DIAGNOSIS — K219 Gastro-esophageal reflux disease without esophagitis: Secondary | ICD-10-CM | POA: Insufficient documentation

## 2014-04-09 DIAGNOSIS — N186 End stage renal disease: Secondary | ICD-10-CM

## 2014-04-09 DIAGNOSIS — J449 Chronic obstructive pulmonary disease, unspecified: Secondary | ICD-10-CM | POA: Diagnosis not present

## 2014-04-09 DIAGNOSIS — I120 Hypertensive chronic kidney disease with stage 5 chronic kidney disease or end stage renal disease: Secondary | ICD-10-CM | POA: Diagnosis not present

## 2014-04-09 DIAGNOSIS — E119 Type 2 diabetes mellitus without complications: Secondary | ICD-10-CM | POA: Insufficient documentation

## 2014-04-09 DIAGNOSIS — Z9981 Dependence on supplemental oxygen: Secondary | ICD-10-CM | POA: Diagnosis not present

## 2014-04-09 DIAGNOSIS — Z6837 Body mass index (BMI) 37.0-37.9, adult: Secondary | ICD-10-CM | POA: Insufficient documentation

## 2014-04-09 DIAGNOSIS — I69898 Other sequelae of other cerebrovascular disease: Secondary | ICD-10-CM | POA: Diagnosis not present

## 2014-04-09 DIAGNOSIS — Z794 Long term (current) use of insulin: Secondary | ICD-10-CM | POA: Insufficient documentation

## 2014-04-09 DIAGNOSIS — H538 Other visual disturbances: Secondary | ICD-10-CM | POA: Insufficient documentation

## 2014-04-09 DIAGNOSIS — Z48812 Encounter for surgical aftercare following surgery on the circulatory system: Secondary | ICD-10-CM

## 2014-04-09 DIAGNOSIS — N179 Acute kidney failure, unspecified: Secondary | ICD-10-CM

## 2014-04-09 HISTORY — PX: BASCILIC VEIN TRANSPOSITION: SHX5742

## 2014-04-09 LAB — POCT I-STAT 4, (NA,K, GLUC, HGB,HCT)
GLUCOSE: 136 mg/dL — AB (ref 70–99)
HCT: 31 % — ABNORMAL LOW (ref 36.0–46.0)
Hemoglobin: 10.5 g/dL — ABNORMAL LOW (ref 12.0–15.0)
POTASSIUM: 3.9 meq/L (ref 3.7–5.3)
Sodium: 145 mEq/L (ref 137–147)

## 2014-04-09 LAB — GLUCOSE, CAPILLARY
GLUCOSE-CAPILLARY: 138 mg/dL — AB (ref 70–99)
GLUCOSE-CAPILLARY: 138 mg/dL — AB (ref 70–99)

## 2014-04-09 SURGERY — TRANSPOSITION, VEIN, BASILIC
Anesthesia: General | Site: Arm Upper | Laterality: Right

## 2014-04-09 MED ORDER — PHENYLEPHRINE HCL 10 MG/ML IJ SOLN
INTRAMUSCULAR | Status: DC | PRN
  Administered 2014-04-09: 80 ug via INTRAVENOUS

## 2014-04-09 MED ORDER — FENTANYL CITRATE 0.05 MG/ML IJ SOLN
INTRAMUSCULAR | Status: AC
  Filled 2014-04-09: qty 2

## 2014-04-09 MED ORDER — FENTANYL CITRATE 0.05 MG/ML IJ SOLN
INTRAMUSCULAR | Status: DC | PRN
  Administered 2014-04-09: 25 ug via INTRAVENOUS
  Administered 2014-04-09: 50 ug via INTRAVENOUS
  Administered 2014-04-09: 25 ug via INTRAVENOUS

## 2014-04-09 MED ORDER — LIDOCAINE HCL (CARDIAC) 20 MG/ML IV SOLN
INTRAVENOUS | Status: AC
  Filled 2014-04-09: qty 5

## 2014-04-09 MED ORDER — FENTANYL CITRATE 0.05 MG/ML IJ SOLN
25.0000 ug | INTRAMUSCULAR | Status: DC | PRN
  Administered 2014-04-09: 25 ug via INTRAVENOUS
  Administered 2014-04-09 (×3): 50 ug via INTRAVENOUS

## 2014-04-09 MED ORDER — PROPOFOL 10 MG/ML IV BOLUS
INTRAVENOUS | Status: AC
  Filled 2014-04-09: qty 20

## 2014-04-09 MED ORDER — DIPHENHYDRAMINE HCL 50 MG/ML IJ SOLN: 10.0000 mg | Freq: Once | INTRAMUSCULAR | Status: DC

## 2014-04-09 MED ORDER — TRAMADOL HCL 50 MG PO TABS: 50.0000 mg | ORAL_TABLET | Freq: Four times a day (QID) | ORAL | Status: DC | PRN

## 2014-04-09 MED ORDER — LIDOCAINE-EPINEPHRINE (PF) 1 %-1:200000 IJ SOLN
INTRAMUSCULAR | Status: AC
  Filled 2014-04-09: qty 10

## 2014-04-09 MED ORDER — PROPOFOL 10 MG/ML IV BOLUS
INTRAVENOUS | Status: DC | PRN
  Administered 2014-04-09: 40 mg via INTRAVENOUS
  Administered 2014-04-09: 120 mg via INTRAVENOUS

## 2014-04-09 MED ORDER — DROPERIDOL 2.5 MG/ML IJ SOLN
0.6250 mg | INTRAMUSCULAR | Status: DC | PRN
  Filled 2014-04-09: qty 0.25

## 2014-04-09 MED ORDER — ONDANSETRON HCL 4 MG/2ML IJ SOLN
INTRAMUSCULAR | Status: AC
  Filled 2014-04-09: qty 2

## 2014-04-09 MED ORDER — FENTANYL CITRATE 0.05 MG/ML IJ SOLN
INTRAMUSCULAR | Status: AC
  Filled 2014-04-09: qty 5

## 2014-04-09 MED ORDER — OXYCODONE-ACETAMINOPHEN 5-325 MG PO TABS: 1.0000 | ORAL_TABLET | Freq: Four times a day (QID) | ORAL | Status: AC | PRN

## 2014-04-09 MED ORDER — SODIUM CHLORIDE 0.9 % IJ SOLN
INTRAMUSCULAR | Status: AC
  Filled 2014-04-09: qty 10

## 2014-04-09 MED ORDER — MIDAZOLAM HCL 5 MG/5ML IJ SOLN
INTRAMUSCULAR | Status: DC | PRN
  Administered 2014-04-09: 1 mg via INTRAVENOUS

## 2014-04-09 MED ORDER — ONDANSETRON HCL 4 MG/2ML IJ SOLN
INTRAMUSCULAR | Status: DC | PRN
  Administered 2014-04-09: 4 mg via INTRAVENOUS

## 2014-04-09 MED ORDER — EPHEDRINE SULFATE 50 MG/ML IJ SOLN
INTRAMUSCULAR | Status: AC
  Filled 2014-04-09: qty 1

## 2014-04-09 MED ORDER — SODIUM CHLORIDE 0.9 % IR SOLN
Status: DC | PRN
  Administered 2014-04-09: 08:00:00

## 2014-04-09 MED ORDER — DIPHENHYDRAMINE HCL 50 MG/ML IJ SOLN
INTRAMUSCULAR | Status: DC | PRN
  Administered 2014-04-09: 12.5 mg via INTRAVENOUS

## 2014-04-09 MED ORDER — LIDOCAINE HCL (CARDIAC) 20 MG/ML IV SOLN
INTRAVENOUS | Status: DC | PRN
  Administered 2014-04-09: 50 mg via INTRAVENOUS

## 2014-04-09 MED ORDER — SODIUM CHLORIDE 0.9 % IV SOLN: INTRAVENOUS | Status: DC

## 2014-04-09 MED ORDER — SODIUM CHLORIDE 0.9 % IV SOLN
INTRAVENOUS | Status: DC | PRN
  Administered 2014-04-09: 07:00:00 via INTRAVENOUS

## 2014-04-09 MED ORDER — PROMETHAZINE HCL 25 MG/ML IJ SOLN
INTRAMUSCULAR | Status: AC
  Administered 2014-04-09: 6.25 mg
  Filled 2014-04-09: qty 1

## 2014-04-09 MED ORDER — MIDAZOLAM HCL 2 MG/2ML IJ SOLN
INTRAMUSCULAR | Status: AC
  Filled 2014-04-09: qty 2

## 2014-04-09 MED ORDER — ROCURONIUM BROMIDE 50 MG/5ML IV SOLN
INTRAVENOUS | Status: AC
  Filled 2014-04-09: qty 1

## 2014-04-09 MED ORDER — CHLORHEXIDINE GLUCONATE CLOTH 2 % EX PADS: 6.0000 | MEDICATED_PAD | Freq: Once | CUTANEOUS | Status: DC

## 2014-04-09 SURGICAL SUPPLY — 40 items
ADH SKN CLS APL DERMABOND .7 (GAUZE/BANDAGES/DRESSINGS) ×2
CANISTER SUCTION 2500CC (MISCELLANEOUS) ×3 IMPLANT
CLIP TI MEDIUM 24 (CLIP) ×3 IMPLANT
CLIP TI WIDE RED SMALL 24 (CLIP) ×3 IMPLANT
COVER PROBE W GEL 5X96 (DRAPES) IMPLANT
COVER SURGICAL LIGHT HANDLE (MISCELLANEOUS) ×3 IMPLANT
DERMABOND ADVANCED (GAUZE/BANDAGES/DRESSINGS) ×4
DERMABOND ADVANCED .7 DNX12 (GAUZE/BANDAGES/DRESSINGS) ×1 IMPLANT
DRAIN PENROSE 1X12 LTX STRL (DRAIN) ×2 IMPLANT
ELECT REM PT RETURN 9FT ADLT (ELECTROSURGICAL) ×3
ELECTRODE REM PT RTRN 9FT ADLT (ELECTROSURGICAL) ×1 IMPLANT
GEL ULTRASOUND 20GR AQUASONIC (MISCELLANEOUS) IMPLANT
GLOVE BIO SURGEON STRL SZ 6.5 (GLOVE) ×1 IMPLANT
GLOVE BIO SURGEONS STRL SZ 6.5 (GLOVE) ×1
GLOVE BIOGEL PI IND STRL 6.5 (GLOVE) IMPLANT
GLOVE BIOGEL PI IND STRL 7.5 (GLOVE) IMPLANT
GLOVE BIOGEL PI INDICATOR 6.5 (GLOVE) ×2
GLOVE BIOGEL PI INDICATOR 7.5 (GLOVE) ×2
GLOVE ECLIPSE 7.5 STRL STRAW (GLOVE) ×2 IMPLANT
GLOVE SS BIOGEL STRL SZ 7 (GLOVE) ×1 IMPLANT
GLOVE SUPERSENSE BIOGEL SZ 7 (GLOVE) ×2
GOWN STRL REUS W/ TWL LRG LVL3 (GOWN DISPOSABLE) ×3 IMPLANT
GOWN STRL REUS W/ TWL XL LVL3 (GOWN DISPOSABLE) IMPLANT
GOWN STRL REUS W/TWL LRG LVL3 (GOWN DISPOSABLE) ×6
GOWN STRL REUS W/TWL XL LVL3 (GOWN DISPOSABLE) ×3
KIT BASIN OR (CUSTOM PROCEDURE TRAY) ×3 IMPLANT
KIT ROOM TURNOVER OR (KITS) ×3 IMPLANT
NS IRRIG 1000ML POUR BTL (IV SOLUTION) ×1 IMPLANT
PACK CV ACCESS (CUSTOM PROCEDURE TRAY) ×3 IMPLANT
PAD ARMBOARD 7.5X6 YLW CONV (MISCELLANEOUS) ×6 IMPLANT
PROBE PENCIL 8 MHZ STRL DISP (MISCELLANEOUS) ×2 IMPLANT
SUT PROLENE 6 0 BV (SUTURE) ×5 IMPLANT
SUT SILK 2 0 SH (SUTURE) ×3 IMPLANT
SUT VIC AB 3-0 SH 27 (SUTURE) ×12
SUT VIC AB 3-0 SH 27X BRD (SUTURE) ×1 IMPLANT
SUT VICRYL 4-0 PS2 18IN ABS (SUTURE) ×4 IMPLANT
TOWEL OR 17X24 6PK STRL BLUE (TOWEL DISPOSABLE) ×1 IMPLANT
TOWEL OR 17X26 10 PK STRL BLUE (TOWEL DISPOSABLE) ×3 IMPLANT
UNDERPAD 30X30 INCONTINENT (UNDERPADS AND DIAPERS) ×3 IMPLANT
WATER STERILE IRR 1000ML POUR (IV SOLUTION) ×3 IMPLANT

## 2014-04-09 NOTE — Discharge Instructions (Signed)

## 2014-04-09 NOTE — Anesthesia Preprocedure Evaluation (Addendum)
Anesthesia Evaluation  Patient identified by MRN, date of birth, ID band Patient awake    Reviewed: Allergy & Precautions, H&P , NPO status , Patient's Chart, lab work & pertinent test results  History of Anesthesia Complications Negative for: history of anesthetic complications  Airway Mallampati: I TM Distance: >3 FB Neck ROM: Full    Dental  (+) Edentulous Upper, Edentulous Lower   Pulmonary shortness of breath, COPD COPD inhaler and oxygen dependent,  breath sounds clear to auscultation        Cardiovascular hypertension, Pt. on medications - anginaRhythm:Regular Rate:Normal  '14 ECHO: EF 53-61%, grade 2 diastolic dysfunction, valves OK   Neuro/Psych CVA (vision affected), Residual Symptoms    GI/Hepatic Neg liver ROS, GERD-  Medicated and Controlled,  Endo/Other  diabetes (glu 138), Insulin DependentMorbid obesity  Renal/GU ESRFRenal disease (K+ 3.9, no dialysis yet)     Musculoskeletal   Abdominal (+) + obese,   Peds  Hematology  (+) Blood dyscrasia (Hb 10.5), ,   Anesthesia Other Findings   Reproductive/Obstetrics                         Anesthesia Physical Anesthesia Plan  ASA: III  Anesthesia Plan: General   Post-op Pain Management:    Induction: Intravenous  Airway Management Planned: LMA  Additional Equipment:   Intra-op Plan:   Post-operative Plan:   Informed Consent: I have reviewed the patients History and Physical, chart, labs and discussed the procedure including the risks, benefits and alternatives for the proposed anesthesia with the patient or authorized representative who has indicated his/her understanding and acceptance.     Plan Discussed with: CRNA and Surgeon  Anesthesia Plan Comments: (Plan routine monitors, GA- LMA OK)        Anesthesia Quick Evaluation

## 2014-04-09 NOTE — H&P (View-Only) (Signed)
Subjective:     Patient ID: Savannah Lindsey, female   DOB: [birth year], 68 y.o.   MRN: [id number]  HPI this 68 year old female returns were continued followup regarding her right brachial-cephalic AV fistula which was created in February of 2015. Again required ligation of branches and superficaization by me in July of 2015. She has not returned for followup. She returns today with the fistula occluded for an unknown duration of time probably several weeks. She is right-handed. She has had previous vein mapping which has revealed poor veins in both upper extremities. She denies any prior numbness in her right hand.   Review of Systems     Objective:   Physical Exam BP 143/83  Pulse 78  Resp 18  Ht 5' (1.524 m)  Wt 194 lb (87.998 kg)  BMI 37.89 kg/m2  General well-developed well-nourished female no apparent stress alert and oriented x3 Lungs no rhonchi or wheezing Cardiovascular regular rhythm no murmurs Abdomen obese no palpable masses Right upper extremity with well-healed antecubital and upper arm incisions. No pulsatile or palpable thrill in fistula. 1-2+ radial pulse palpable.  Today I ordered a duplex scan of the fistula which confirmed that there was no flow in it it was occluded and there was triphasic flow in the brachial artery. I also performed an independent bedside SonoSite ultrasound exam which revealed that there is a patent right basilic vein which gets small at the antecubital area.      Assessment:     Discussed situation with patient and her son who is deaf with the help of an interpreter. Explained that the success rate is related to how large and what quality the vein is.    Plan:     Will make one final attempt at right upper extremity fistula-right basilic vein transposition on Thursday of this week-October 8 If this fails patient will need AV graft

## 2014-04-09 NOTE — Anesthesia Postprocedure Evaluation (Signed)
  Anesthesia Post-op Note  Patient: Savannah Lindsey  Procedure(s) Performed: Procedure(s): RIGHT ARM BASCILIC VEIN TRANSPOSITION (Right)  Patient Location: PACU  Anesthesia Type:General  Level of Consciousness: awake, alert , oriented and patient cooperative  Airway and Oxygen Therapy: Patient Spontanous Breathing  Post-op Pain: none  Post-op Assessment: Post-op Vital signs reviewed, Patient's Cardiovascular Status Stable, Respiratory Function Stable, Patent Airway and Pain level controlled, nausea improved   Post-op Vital Signs: Reviewed and stable  Last Vitals:  Filed Vitals:   04/09/14 1152  BP: 111/65  Pulse: 68  Temp:   Resp: 15    Complications: No apparent anesthesia complications

## 2014-04-09 NOTE — Interval H&P Note (Signed)
History and Physical Interval Note:  04/09/2014 7:25 AM  Savannah Lindsey  has presented today for surgery, with the diagnosis of End Stage Renal Disease  The various methods of treatment have been discussed with the patient and family. After consideration of risks, benefits and other options for treatment, the patient has consented to  Procedure(s): BASCILIC VEIN TRANSPOSITION-RIGHT (Right) as a surgical intervention .  The patient's history has been reviewed, patient examined, no change in status, stable for surgery.  I have reviewed the patient's chart and labs.  Questions were answered to the patient's satisfaction.     Tinnie Gens

## 2014-04-09 NOTE — Op Note (Signed)
OPERATIVE REPORT  Date of Surgery: 04/09/2014  Surgeon: Tinnie Gens, MD  Assistant: Dorise Bullion  Pre-op Diagnosis: End Stage Renal Disease  Post-op Diagnosis: End Stage Renal Disease  Procedure: Procedure(s): RIGHT ARM BASCILIC VEIN TRANSPOSITION  Anesthesia: LMA  EBL: Minimal  Complications: None  Procedure Details: The patient was taken to the operating room placed in supine position at which time satisfactory general/LMA anesthesia was administered. The right upper extremity was prepped with Betadine scrub and solution draped in a routine sterile manner. The basilic vein was imaged using B. mode ultrasound ice on the site and marked. It appeared adequate down to the antecubital area. It was then exposed and circumferentially mobilized through 3 separate incisions along the medial aspect of the right upper arm up to the axillary area. Branches were ligated between 4-0 silk ties and divided. There was a satisfactory vein being at least 3 mm in size. Following this the brachial artery was exposed through the distal upper arm incision it was a normal Artery with a good pulse. The vein was transected after ligating it distally gently dilated with heparinized saline. It was tunnelled  along the anterior aspect of the arm being careful to preserve its orientation and delivered into the distal wound the artery had been exposed. Artery was occluded proximally and distally with Vesseloops 15 blade extended with Potts scissors. was used. there was excellent inflow. there was good backbleeding. the vein was carefully measured spatulated and anastomosed end to side with 6-0 prolene. clamps were released there was good pulse and thrill in the fistula which was easily palpable beneath the skin. there was audible doppler flow in the radial and ulnar artery at the wrist which improved with compression of the fistula. adequate hemostasis was achieved wounds were closed in layers with vicryl in a  subcuticular fashion with dermabond patient taken to the recovery room in stable condition Tinnie Gens, MD 04/09/2014 9:42 AM

## 2014-04-09 NOTE — Transfer of Care (Signed)
Immediate Anesthesia Transfer of Care Note  Patient: Savannah Lindsey  Procedure(s) Performed: Procedure(s): RIGHT ARM BASCILIC VEIN TRANSPOSITION (Right)  Patient Location: PACU  Anesthesia Type:General  Level of Consciousness: awake and alert   Airway & Oxygen Therapy: Patient Spontanous Breathing and Patient connected to nasal cannula oxygen  Post-op Assessment: Report given to PACU RN and Post -op Vital signs reviewed and stable  Post vital signs: Reviewed and stable  Complications: No apparent anesthesia complications

## 2014-04-10 ENCOUNTER — Encounter (HOSPITAL_COMMUNITY): Payer: Self-pay | Admitting: Vascular Surgery

## 2014-04-10 ENCOUNTER — Telehealth: Payer: Self-pay | Admitting: Vascular Surgery

## 2014-04-10 NOTE — Telephone Encounter (Addendum)
Message copied by Gena Fray on Fri Apr 10, 2014 10:36 AM ------      Message from: Denman George      Created: Thu Apr 09, 2014  3:43 PM      Regarding: Zigmund Daniel log; also needs 6 wk. f/u with J[facility] with access duplex                   ----- Message -----         From: Alvia Grove, PA-C         Sent: 04/09/2014   9:41 AM           To: Vvs Charge Pool            S/p right BVT 04/09/14            F/u with Dr. Kellie Simmering in 6 weeks            Thanks      Maudie Mercury ------  04/10/14: left msg for Ersa at home #, dpm

## 2014-04-13 ENCOUNTER — Inpatient Hospital Stay (HOSPITAL_COMMUNITY)
Admission: EM | Admit: 2014-04-13 | Discharge: 2014-04-15 | DRG: 546 | Disposition: A | Discharge status: Home / Self Care | Payer: Medicare HMO | Attending: Internal Medicine | Admitting: Internal Medicine

## 2014-04-13 ENCOUNTER — Encounter (HOSPITAL_COMMUNITY): Payer: Self-pay | Admitting: Emergency Medicine

## 2014-04-13 ENCOUNTER — Emergency Department (HOSPITAL_COMMUNITY): Payer: Medicare HMO

## 2014-04-13 DIAGNOSIS — R1310 Dysphagia, unspecified: Secondary | ICD-10-CM

## 2014-04-13 DIAGNOSIS — N185 Chronic kidney disease, stage 5: Secondary | ICD-10-CM | POA: Diagnosis present

## 2014-04-13 DIAGNOSIS — G8918 Other acute postprocedural pain: Secondary | ICD-10-CM

## 2014-04-13 DIAGNOSIS — Z794 Long term (current) use of insulin: Secondary | ICD-10-CM

## 2014-04-13 DIAGNOSIS — F419 Anxiety disorder, unspecified: Secondary | ICD-10-CM | POA: Diagnosis present

## 2014-04-13 DIAGNOSIS — Z7982 Long term (current) use of aspirin: Secondary | ICD-10-CM

## 2014-04-13 DIAGNOSIS — Z9849 Cataract extraction status, unspecified eye: Secondary | ICD-10-CM | POA: Diagnosis not present

## 2014-04-13 DIAGNOSIS — J449 Chronic obstructive pulmonary disease, unspecified: Secondary | ICD-10-CM | POA: Diagnosis present

## 2014-04-13 DIAGNOSIS — R1013 Epigastric pain: Secondary | ICD-10-CM

## 2014-04-13 DIAGNOSIS — Z85828 Personal history of other malignant neoplasm of skin: Secondary | ICD-10-CM | POA: Diagnosis not present

## 2014-04-13 DIAGNOSIS — K3189 Other diseases of stomach and duodenum: Secondary | ICD-10-CM

## 2014-04-13 DIAGNOSIS — E119 Type 2 diabetes mellitus without complications: Secondary | ICD-10-CM | POA: Diagnosis present

## 2014-04-13 DIAGNOSIS — H5440 Blindness, one eye, unspecified eye: Secondary | ICD-10-CM | POA: Diagnosis present

## 2014-04-13 DIAGNOSIS — I10 Essential (primary) hypertension: Secondary | ICD-10-CM

## 2014-04-13 DIAGNOSIS — N179 Acute kidney failure, unspecified: Secondary | ICD-10-CM | POA: Diagnosis present

## 2014-04-13 DIAGNOSIS — Z885 Allergy status to narcotic agent status: Secondary | ICD-10-CM

## 2014-04-13 DIAGNOSIS — Z8673 Personal history of transient ischemic attack (TIA), and cerebral infarction without residual deficits: Secondary | ICD-10-CM | POA: Diagnosis not present

## 2014-04-13 DIAGNOSIS — M1990 Unspecified osteoarthritis, unspecified site: Secondary | ICD-10-CM | POA: Diagnosis present

## 2014-04-13 DIAGNOSIS — M316 Other giant cell arteritis: Secondary | ICD-10-CM | POA: Diagnosis not present

## 2014-04-13 DIAGNOSIS — I120 Hypertensive chronic kidney disease with stage 5 chronic kidney disease or end stage renal disease: Secondary | ICD-10-CM | POA: Diagnosis present

## 2014-04-13 DIAGNOSIS — K219 Gastro-esophageal reflux disease without esophagitis: Secondary | ICD-10-CM | POA: Diagnosis present

## 2014-04-13 DIAGNOSIS — R51 Headache: Secondary | ICD-10-CM

## 2014-04-13 DIAGNOSIS — R1319 Other dysphagia: Secondary | ICD-10-CM

## 2014-04-13 DIAGNOSIS — N189 Chronic kidney disease, unspecified: Secondary | ICD-10-CM

## 2014-04-13 DIAGNOSIS — R42 Dizziness and giddiness: Secondary | ICD-10-CM

## 2014-04-13 DIAGNOSIS — Z90710 Acquired absence of both cervix and uterus: Secondary | ICD-10-CM | POA: Diagnosis not present

## 2014-04-13 DIAGNOSIS — E7800 Pure hypercholesterolemia, unspecified: Secondary | ICD-10-CM

## 2014-04-13 DIAGNOSIS — D649 Anemia, unspecified: Secondary | ICD-10-CM | POA: Diagnosis present

## 2014-04-13 DIAGNOSIS — N186 End stage renal disease: Secondary | ICD-10-CM

## 2014-04-13 DIAGNOSIS — Z8701 Personal history of pneumonia (recurrent): Secondary | ICD-10-CM

## 2014-04-13 DIAGNOSIS — R531 Weakness: Secondary | ICD-10-CM

## 2014-04-13 DIAGNOSIS — R200 Anesthesia of skin: Secondary | ICD-10-CM

## 2014-04-13 DIAGNOSIS — G441 Vascular headache, not elsewhere classified: Secondary | ICD-10-CM

## 2014-04-13 DIAGNOSIS — Z9049 Acquired absence of other specified parts of digestive tract: Secondary | ICD-10-CM | POA: Diagnosis present

## 2014-04-13 DIAGNOSIS — E876 Hypokalemia: Secondary | ICD-10-CM

## 2014-04-13 DIAGNOSIS — K317 Polyp of stomach and duodenum: Secondary | ICD-10-CM

## 2014-04-13 DIAGNOSIS — R519 Headache, unspecified: Secondary | ICD-10-CM

## 2014-04-13 DIAGNOSIS — N390 Urinary tract infection, site not specified: Secondary | ICD-10-CM

## 2014-04-13 DIAGNOSIS — N184 Chronic kidney disease, stage 4 (severe): Secondary | ICD-10-CM

## 2014-04-13 HISTORY — DX: Dependence on supplemental oxygen: Z99.81

## 2014-04-13 LAB — COMPREHENSIVE METABOLIC PANEL
ALK PHOS: 193 U/L — AB (ref 39–117)
ALT: 32 U/L (ref 0–35)
AST: 15 U/L (ref 0–37)
Albumin: 3.2 g/dL — ABNORMAL LOW (ref 3.5–5.2)
Anion gap: 18 — ABNORMAL HIGH (ref 5–15)
BUN: 55 mg/dL — ABNORMAL HIGH (ref 6–23)
CALCIUM: 9.2 mg/dL (ref 8.4–10.5)
CO2: 18 mEq/L — ABNORMAL LOW (ref 19–32)
Chloride: 103 mEq/L (ref 96–112)
Creatinine, Ser: 5.98 mg/dL — ABNORMAL HIGH (ref 0.50–1.10)
GFR calc Af Amer: 7 mL/min — ABNORMAL LOW (ref >90)
GFR calc non Af Amer: 6 mL/min — ABNORMAL LOW (ref >90)
Glucose, Bld: 111 mg/dL — ABNORMAL HIGH (ref 70–99)
POTASSIUM: 4.4 meq/L (ref 3.7–5.3)
Sodium: 139 mEq/L (ref 137–147)
TOTAL PROTEIN: 7.6 g/dL (ref 6.0–8.3)
Total Bilirubin: 0.4 mg/dL (ref 0.3–1.2)

## 2014-04-13 LAB — CBC WITH DIFFERENTIAL/PLATELET
BASOS ABS: 0 10*3/uL (ref 0.0–0.1)
Basophils Relative: 0 % (ref 0–1)
EOS ABS: 0.2 10*3/uL (ref 0.0–0.7)
EOS PCT: 2 % (ref 0–5)
HCT: 32.2 % — ABNORMAL LOW (ref 36.0–46.0)
Hemoglobin: 10.2 g/dL — ABNORMAL LOW (ref 12.0–15.0)
LYMPHS PCT: 16 % (ref 12–46)
Lymphs Abs: 1.8 10*3/uL (ref 0.7–4.0)
MCH: 29.3 pg (ref 26.0–34.0)
MCHC: 31.7 g/dL (ref 30.0–36.0)
MCV: 92.5 fL (ref 78.0–100.0)
Monocytes Absolute: 0.5 10*3/uL (ref 0.1–1.0)
Monocytes Relative: 5 % (ref 3–12)
NEUTROS PCT: 77 % (ref 43–77)
Neutro Abs: 8.9 10*3/uL — ABNORMAL HIGH (ref 1.7–7.7)
PLATELETS: 267 10*3/uL (ref 150–400)
RBC: 3.48 MIL/uL — AB (ref 3.87–5.11)
RDW: 15 % (ref 11.5–15.5)
WBC: 11.6 10*3/uL — AB (ref 4.0–10.5)

## 2014-04-13 LAB — URINE MICROSCOPIC-ADD ON

## 2014-04-13 LAB — URINALYSIS, ROUTINE W REFLEX MICROSCOPIC
Bilirubin Urine: NEGATIVE
Glucose, UA: NEGATIVE mg/dL
Ketones, ur: NEGATIVE mg/dL
Leukocytes, UA: NEGATIVE
Nitrite: NEGATIVE
PROTEIN: 30 mg/dL — AB
SPECIFIC GRAVITY, URINE: 1.011 (ref 1.005–1.030)
Urobilinogen, UA: 0.2 mg/dL (ref 0.0–1.0)
pH: 5 (ref 5.0–8.0)

## 2014-04-13 LAB — I-STAT CG4 LACTIC ACID, ED: Lactic Acid, Venous: 0.49 mmol/L — ABNORMAL LOW (ref 0.5–2.2)

## 2014-04-13 LAB — I-STAT TROPONIN, ED: TROPONIN I, POC: 0 ng/mL (ref 0.00–0.08)

## 2014-04-13 MED ORDER — SODIUM CHLORIDE 0.9 % IV BOLUS (SEPSIS)
500.0000 mL | Freq: Once | INTRAVENOUS | Status: AC
  Administered 2014-04-13: 500 mL via INTRAVENOUS

## 2014-04-13 MED ORDER — METOCLOPRAMIDE HCL 5 MG/ML IJ SOLN
10.0000 mg | Freq: Once | INTRAMUSCULAR | Status: AC
  Administered 2014-04-13: 2.5 mg via INTRAVENOUS
  Filled 2014-04-13: qty 2

## 2014-04-13 MED ORDER — DIPHENHYDRAMINE HCL 50 MG/ML IJ SOLN
25.0000 mg | Freq: Once | INTRAMUSCULAR | Status: DC
  Filled 2014-04-13: qty 1

## 2014-04-13 MED ORDER — ONDANSETRON HCL 4 MG/2ML IJ SOLN: 4.0000 mg | Freq: Once | INTRAMUSCULAR | Status: DC

## 2014-04-13 NOTE — ED Notes (Signed)
Pt states that her IV is burning, however the IV is patent. This RN started to push medications, and the pt demanded that this RN stop putting things through the current IV. This RN was to attempt to start another IV, however pt refused, and did not want the rest of the medication, stating "my head feels better". Pt's son is trying to convince the pt to leave, Sunny Schlein, MD is aware.

## 2014-04-13 NOTE — ED Notes (Signed)
Pt ambulatory to bathroom with steady gait. Pt's son as a standby assist.

## 2014-04-13 NOTE — ED Notes (Signed)
Dr.Brtalik at bedside

## 2014-04-13 NOTE — ED Provider Notes (Signed)
CSN: [id number]     Arrival date & time 04/13/14  2017 History   First MD Initiated Contact with Patient 04/13/14 2020     Chief Complaint  Patient presents with  . Weakness  . Nausea  . Headache     (Consider location/radiation/quality/duration/timing/severity/associated sxs/prior Treatment) HPI Comments: This 68 year old with past medical history of hypertension, COPD, high cholesterol, temporal arteritis comes in with chief complaint of headache. Patient states she's had headache for about 4 days. States it hurts over her left temporal area. Consistent with previous temporal arteritis headaches. She also endorsed some nausea and vomiting as well. She states she's had these symptoms before. Denies any chest pain or trouble breathing. Denies any abdominal pain or urinary symptoms. Denies numbness weakness. Still able to ambulate. Patient is in the process of being transitioned to dialysis. She has had multiple problems with her right AV fistula including a recent admission  for surgical revision.   Past Medical History  Diagnosis Date  . Hypertension   . COPD (chronic obstructive pulmonary disease)   . GERD (gastroesophageal reflux disease)   . Hypercholesteremia   . Anxiety   . Stroke   . Headaches due to old head injury   . Blind one eye   . Angina   . Pneumonia   . Recurrent upper respiratory infection (URI)   . Anemia   . Arthritis   . Complication of anesthesia     difficulty waking up  . Chronic kidney disease   . Cancer     skin cancer right thumb  . Temporal arteritis 11/17/2011  . Gastric polyp     antral  . Stenosis of esophagus   . Diabetes mellitus     TYpe 2 IDDM x 11 years  . Bursitis   . Shortness of breath     uses O2 2 L at night   Past Surgical History  Procedure Laterality Date  . Abdominal hysterectomy    . Cholecystectomy    . Heel spur surgery      bilateral  . Esophagogastroduodenoscopy  11/18/2011    Procedure: ESOPHAGOGASTRODUODENOSCOPY  (EGD);  Surgeon: Gatha Mayer, MD;  Location: [facility] ENDOSCOPY;  Service: Endoscopy;  Laterality: N/A;  . Eye surgery      cataracts/IOL  . Brain surgery      temporal arteritis  . Av fistula placement Right 08/14/2013    Procedure: ARTERIOVENOUS (AV) FISTULA CREATION;  Surgeon: Serafina Mitchell, MD;  Location: [place];  Service: Vascular;  Laterality: Right;  . Av fistula placement Right 08/14/2013    Procedure:  CREATION BRACHIOCEPHALIC ARTERIOVENOUS (AV) FISTULA;  Surgeon: Serafina Mitchell, MD;  Location: [place];  Service: Vascular;  Laterality: Right;  . Fistula superficialization Right 01/08/2014    Procedure: FISTULA SUPERFICIALIZATION;  Surgeon: Mal Misty, MD;  Location: [place];  Service: Vascular;  Laterality: Right;  . Colonoscopy    . Appendectomy    . Bascilic vein transposition Right 04/09/2014    Procedure: RIGHT ARM [place];  Surgeon: Mal Misty, MD;  Location: [place];  Service: Vascular;  Laterality: Right;   Family History  Problem Relation Age of Onset  . Colon cancer    . Diabetes Brother   . Hypertension Mother   . COPD Father   . Diabetes Father    History  Substance Use Topics  . Smoking status: Never Smoker   . Smokeless tobacco: Never Used  . Alcohol Use: No   OB History  Grav Para Term Preterm Abortions TAB SAB Ect Mult Living                 Review of Systems  Constitutional: Negative for fever, activity change, appetite change and fatigue.  HENT: Negative for congestion and rhinorrhea.   Eyes: Negative for discharge, redness and itching.  Respiratory: Negative for shortness of breath and wheezing.   Cardiovascular: Positive for leg swelling. Negative for chest pain.  Gastrointestinal: Positive for nausea and vomiting. Negative for abdominal pain and diarrhea.  Genitourinary: Negative for dysuria and hematuria.  Musculoskeletal: Negative for back pain.  Skin: Negative for rash and wound.  Neurological: Positive for headaches.  Negative for syncope.      Allergies  Codeine and Tramadol  Home Medications   Prior to Admission medications   Medication Sig Start Date End Date Taking? Authorizing Provider  acetaminophen (TYLENOL) 500 MG tablet Take 1,000 mg by mouth once.   Yes Historical Provider, MD  albuterol (PROVENTIL) (2.5 MG/3ML) 0.083% nebulizer solution Take 2.5 mg by nebulization every 6 (six) hours as needed for wheezing. For wheezing   Yes Historical Provider, MD  aspirin 325 MG tablet Take 325 mg by mouth daily.   Yes Historical Provider, MD  diazepam (VALIUM) 5 MG tablet Take 5 mg by mouth at bedtime. 11/30/13  Yes Richarda Blade, MD  furosemide (LASIX) 40 MG tablet Take 40 mg by mouth daily.   Yes Historical Provider, MD  gabapentin (NEURONTIN) 100 MG capsule Take 200 mg by mouth at bedtime.   Yes Historical Provider, MD  insulin aspart (NOVOLOG) 100 UNIT/ML injection Inject 13-16 Units into the skin daily as needed. Per sliding scale   Yes Historical Provider, MD  insulin detemir (LEVEMIR) 100 UNIT/ML injection Inject 30 Units into the skin at bedtime.    Yes Historical Provider, MD  loratadine (CLARITIN) 10 MG tablet Take 10 mg by mouth every morning.    Yes Historical Provider, MD  losartan (COZAAR) 25 MG tablet Take 25 mg by mouth daily.   Yes Historical Provider, MD  lubiprostone (AMITIZA) 24 MCG capsule Take 24 mcg by mouth daily with breakfast.   Yes Historical Provider, MD  oxyCODONE-acetaminophen (ROXICET) 5-325 MG per tablet Take 1 tablet by mouth every 6 (six) hours as needed for severe pain. 04/09/14  Yes Samantha J Rhyne, PA-C  pantoprazole (PROTONIX) 40 MG tablet Take 40 mg by mouth every morning.   Yes Historical Provider, MD  potassium chloride (K-DUR) 10 MEQ tablet Take 10 mEq by mouth every morning.   Yes Historical Provider, MD   BP 149/78  Pulse 83  Temp(Src) 98 F (36.7 C) (Oral)  Resp 14  Ht 5' (1.524 m)  Wt 186 lb (84.369 kg)  BMI 36.33 kg/m2  SpO2 97% Physical Exam   Constitutional: She is oriented to person, place, and time. She appears well-developed and well-nourished. No distress.  HENT:  Head: Normocephalic and atraumatic.  Mouth/Throat: Oropharynx is clear and moist. No oropharyngeal exudate.  Eyes: Conjunctivae and EOM are normal. Pupils are equal, round, and reactive to light. Right eye exhibits no discharge. Left eye exhibits no discharge. No scleral icterus.  Neck: Normal range of motion. Neck supple.  Cardiovascular: Normal rate, regular rhythm and normal heart sounds.   No murmur heard. Pulmonary/Chest: Effort normal and breath sounds normal. No respiratory distress. She has no wheezes. She has no rales.  Abdominal: Soft. She exhibits no distension and no mass. There is no tenderness.  Abdomen soft, nontender  Musculoskeletal: She exhibits edema (2+ bilateral lower extremity edema).  Fistula present in right arm  Neurological: She is alert and oriented to person, place, and time. She exhibits normal muscle tone. Coordination normal.  5/5 strength in all extremities Intact sensation in all extremities 2+ brachioradialis and patella DTRs Cranial nerves II through XII Absent vision left eye chronic  Skin: Skin is warm. No rash noted. She is not diaphoretic.    ED Course  Procedures (including critical care time) Labs Review Labs Reviewed  CBC WITH DIFFERENTIAL - Abnormal; Notable for the following:    WBC 11.6 (*)    RBC 3.48 (*)    Hemoglobin 10.2 (*)    HCT 32.2 (*)    Neutro Abs 8.9 (*)    All other components within normal limits  COMPREHENSIVE METABOLIC PANEL - Abnormal; Notable for the following:    CO2 18 (*)    Glucose, Bld 111 (*)    BUN 55 (*)    Creatinine, Ser 5.98 (*)    Albumin 3.2 (*)    Alkaline Phosphatase 193 (*)    GFR calc non Af Amer 6 (*)    GFR calc Af Amer 7 (*)    Anion gap 18 (*)    All other components within normal limits  URINALYSIS, ROUTINE W REFLEX MICROSCOPIC - Abnormal; Notable for the  following:    APPearance CLOUDY (*)    Hgb urine dipstick SMALL (*)    Protein, ur 30 (*)    All other components within normal limits  URINE MICROSCOPIC-ADD ON - Abnormal; Notable for the following:    Casts HYALINE CASTS (*)    All other components within normal limits  SEDIMENTATION RATE - Abnormal; Notable for the following:    Sed Rate 66 (*)    All other components within normal limits  I-STAT CG4 LACTIC ACID, ED - Abnormal; Notable for the following:    Lactic Acid, Venous 0.49 (*)    All other components within normal limits  C-REACTIVE PROTEIN  I-STAT TROPOININ, ED    Imaging Review Ct Head Wo Contrast  04/13/2014   CLINICAL DATA:  Weakness. Headache. Nausea. History of temporal arteritis. Initial encounter.  EXAM: CT HEAD WITHOUT CONTRAST  TECHNIQUE: Contiguous axial images were obtained from the base of the skull through the vertex without intravenous contrast.  COMPARISON:  05/24/2013; brain MRI -05/25/2013  FINDINGS: Rather extensive periventricular hypodensities appear grossly unchanged, right greater than left. Old infarct involving the left occipital lobe (image 11, series 201). Gray-white differentiation is otherwise well maintained. No CT evidence acute large territory infarct. No intraparenchymal extra-axial mass or hemorrhage. Unchanged size and configuration of the ventricles and basilar cisterns. No midline shift. Intracranial atherosclerosis. Limited visualization the paranasal sinuses and mastoid air cells are normal. Regional soft tissues appear normal. Post bilateral cataract surgery. No displaced calvarial fracture.  IMPRESSION: 1. No acute intracranial process. 2. Similar findings of microvascular ischemic disease and prior infarct involving the left occipital lobe.   Electronically Signed   By: Sandi Mariscal M.D.   On: 04/13/2014 22:58   Dg Chest Portable 1 View  04/13/2014   CLINICAL DATA:  Weakness and nausea.  Headache.  Initial encounter.  EXAM: PORTABLE CHEST  - 1 VIEW  COMPARISON:  05/25/2013  FINDINGS: Normal heart size for technique. Negative aortic contours. There is mild coarsening of basilar lung markings without edema or definitive consolidation. No effusion or pneumothorax.  IMPRESSION: No edema or definitive pneumonia. If ongoing concern for  pneumonia, suggest lateral imaging.   Electronically Signed   By: Jorje Guild M.D.   On: 04/13/2014 21:16     EKG Interpretation None      MDM   MDM: Patient comes in with headache likely chronic secondary to temporal arteritis. Also comes in with nausea and vomiting as well which she states she's had before. She denies any abdominal pain. Vitals are stable. Unlikely acute abdominal emergency with stable vitals and no abdominal pain or tenderness on exam. Screening labs. They're significant for a BUN of 55 and worsening acid-base status. Patient has no plans for dialysis. I suspect that with no followup for dialysis near future patient may develop critical metabolic abnormalities before her fistula matures. We will admit to hospitalist for further management of her acute on chronic renal disease. Patient was given Reglan in the ER which completely resolved all of her symptoms. Admit.  Final diagnoses:  Headache  Acute on chronic kidney failure    Admit  Sol Passer, MD 04/14/14 [phone]

## 2014-04-13 NOTE — ED Notes (Signed)
Pt is from home. Pt states that she has a dialysis graft placed in her right arm on Friday, and has been complaining of weakness, nausea, vomiting and pain in her head and abdomen since then. Pt has been unable to eat or drink anything, including taking any of her medications. Pt has not had dialysis yet.

## 2014-04-14 ENCOUNTER — Encounter (HOSPITAL_COMMUNITY): Payer: Self-pay | Admitting: Internal Medicine

## 2014-04-14 DIAGNOSIS — N189 Chronic kidney disease, unspecified: Secondary | ICD-10-CM

## 2014-04-14 DIAGNOSIS — I10 Essential (primary) hypertension: Secondary | ICD-10-CM

## 2014-04-14 DIAGNOSIS — R519 Headache, unspecified: Secondary | ICD-10-CM | POA: Diagnosis present

## 2014-04-14 DIAGNOSIS — E119 Type 2 diabetes mellitus without complications: Secondary | ICD-10-CM

## 2014-04-14 DIAGNOSIS — N179 Acute kidney failure, unspecified: Secondary | ICD-10-CM

## 2014-04-14 DIAGNOSIS — N185 Chronic kidney disease, stage 5: Secondary | ICD-10-CM

## 2014-04-14 DIAGNOSIS — R51 Headache: Secondary | ICD-10-CM

## 2014-04-14 LAB — COMPREHENSIVE METABOLIC PANEL
ALBUMIN: 3 g/dL — AB (ref 3.5–5.2)
ALK PHOS: 177 U/L — AB (ref 39–117)
ALT: 27 U/L (ref 0–35)
AST: 12 U/L (ref 0–37)
Anion gap: 17 — ABNORMAL HIGH (ref 5–15)
BUN: 52 mg/dL — AB (ref 6–23)
CALCIUM: 8.9 mg/dL (ref 8.4–10.5)
CO2: 17 mEq/L — ABNORMAL LOW (ref 19–32)
Chloride: 106 mEq/L (ref 96–112)
Creatinine, Ser: 5.82 mg/dL — ABNORMAL HIGH (ref 0.50–1.10)
GFR calc Af Amer: 8 mL/min — ABNORMAL LOW (ref >90)
GFR calc non Af Amer: 7 mL/min — ABNORMAL LOW (ref >90)
Glucose, Bld: 97 mg/dL (ref 70–99)
POTASSIUM: 3.9 meq/L (ref 3.7–5.3)
Sodium: 140 mEq/L (ref 137–147)
Total Bilirubin: 0.4 mg/dL (ref 0.3–1.2)
Total Protein: 7.1 g/dL (ref 6.0–8.3)

## 2014-04-14 LAB — CBC WITH DIFFERENTIAL/PLATELET
Basophils Absolute: 0 10*3/uL (ref 0.0–0.1)
Basophils Relative: 0 % (ref 0–1)
Eosinophils Absolute: 0.2 10*3/uL (ref 0.0–0.7)
Eosinophils Relative: 2 % (ref 0–5)
HEMATOCRIT: 30.9 % — AB (ref 36.0–46.0)
HEMOGLOBIN: 9.7 g/dL — AB (ref 12.0–15.0)
LYMPHS ABS: 2 10*3/uL (ref 0.7–4.0)
LYMPHS PCT: 20 % (ref 12–46)
MCH: 29.7 pg (ref 26.0–34.0)
MCHC: 31.4 g/dL (ref 30.0–36.0)
MCV: 94.5 fL (ref 78.0–100.0)
MONO ABS: 0.5 10*3/uL (ref 0.1–1.0)
MONOS PCT: 5 % (ref 3–12)
NEUTROS ABS: 7.2 10*3/uL (ref 1.7–7.7)
NEUTROS PCT: 73 % (ref 43–77)
Platelets: 254 10*3/uL (ref 150–400)
RBC: 3.27 MIL/uL — AB (ref 3.87–5.11)
RDW: 14.8 % (ref 11.5–15.5)
WBC: 10 10*3/uL (ref 4.0–10.5)

## 2014-04-14 LAB — GLUCOSE, CAPILLARY
GLUCOSE-CAPILLARY: 94 mg/dL (ref 70–99)
GLUCOSE-CAPILLARY: 190 mg/dL — AB (ref 70–99)
Glucose-Capillary: 127 mg/dL — ABNORMAL HIGH (ref 70–99)
Glucose-Capillary: 224 mg/dL — ABNORMAL HIGH (ref 70–99)

## 2014-04-14 LAB — C-REACTIVE PROTEIN
CRP: 6.1 mg/dL — ABNORMAL HIGH (ref <0.60)
CRP: 7 mg/dL — ABNORMAL HIGH

## 2014-04-14 LAB — SEDIMENTATION RATE: SED RATE: 66 mm/h — AB (ref 0–22)

## 2014-04-14 MED ORDER — ONDANSETRON HCL 4 MG PO TABS
4.0000 mg | ORAL_TABLET | Freq: Four times a day (QID) | ORAL | Status: DC | PRN
Start: 2014-04-14 — End: 2014-04-15

## 2014-04-14 MED ORDER — LUBIPROSTONE 24 MCG PO CAPS
24.0000 ug | ORAL_CAPSULE | Freq: Every day | ORAL | Status: DC
  Administered 2014-04-14 – 2014-04-15 (×2): 24 ug via ORAL
  Filled 2014-04-14 (×3): qty 1

## 2014-04-14 MED ORDER — INSULIN ASPART 100 UNIT/ML ~~LOC~~ SOLN
0.0000 [IU] | Freq: Three times a day (TID) | SUBCUTANEOUS | Status: DC
  Administered 2014-04-14: 1 [IU] via SUBCUTANEOUS
  Administered 2014-04-14: 3 [IU] via SUBCUTANEOUS
  Administered 2014-04-15: 1 [IU] via SUBCUTANEOUS
  Administered 2014-04-15: 2 [IU] via SUBCUTANEOUS

## 2014-04-14 MED ORDER — PANTOPRAZOLE SODIUM 40 MG PO TBEC
40.0000 mg | DELAYED_RELEASE_TABLET | Freq: Every morning | ORAL | Status: DC
  Administered 2014-04-14 – 2014-04-15 (×2): 40 mg via ORAL
  Filled 2014-04-14 (×2): qty 1

## 2014-04-14 MED ORDER — ASPIRIN 325 MG PO TABS
325.0000 mg | ORAL_TABLET | Freq: Every day | ORAL | Status: DC
  Administered 2014-04-14 – 2014-04-15 (×2): 325 mg via ORAL
  Filled 2014-04-14 (×2): qty 1

## 2014-04-14 MED ORDER — PREDNISONE 20 MG PO TABS
40.0000 mg | ORAL_TABLET | Freq: Every day | ORAL | Status: DC
  Administered 2014-04-14 – 2014-04-15 (×2): 40 mg via ORAL
  Filled 2014-04-14 (×3): qty 2

## 2014-04-14 MED ORDER — GABAPENTIN 100 MG PO CAPS
200.0000 mg | ORAL_CAPSULE | Freq: Every day | ORAL | Status: DC
  Administered 2014-04-14: 200 mg via ORAL
  Filled 2014-04-14 (×2): qty 2

## 2014-04-14 MED ORDER — ENOXAPARIN SODIUM 30 MG/0.3ML ~~LOC~~ SOLN
30.0000 mg | SUBCUTANEOUS | Status: DC
  Administered 2014-04-14: 30 mg via SUBCUTANEOUS
  Filled 2014-04-14 (×2): qty 0.3

## 2014-04-14 MED ORDER — ACETAMINOPHEN 650 MG RE SUPP
650.0000 mg | Freq: Four times a day (QID) | RECTAL | Status: DC | PRN
Start: 2014-04-14 — End: 2014-04-15

## 2014-04-14 MED ORDER — ALBUTEROL SULFATE (2.5 MG/3ML) 0.083% IN NEBU: 2.5000 mg | INHALATION_SOLUTION | Freq: Four times a day (QID) | RESPIRATORY_TRACT | Status: DC | PRN

## 2014-04-14 MED ORDER — SODIUM CHLORIDE 0.9 % IV SOLN
INTRAVENOUS | Status: AC
  Administered 2014-04-14: 17:00:00 via INTRAVENOUS

## 2014-04-14 MED ORDER — SODIUM BICARBONATE 650 MG PO TABS
650.0000 mg | ORAL_TABLET | Freq: Two times a day (BID) | ORAL | Status: DC
  Administered 2014-04-14 – 2014-04-15 (×2): 650 mg via ORAL
  Filled 2014-04-14 (×4): qty 1

## 2014-04-14 MED ORDER — SODIUM CHLORIDE 0.9 % IJ SOLN
3.0000 mL | Freq: Two times a day (BID) | INTRAMUSCULAR | Status: DC
  Administered 2014-04-14: 3 mL via INTRAVENOUS

## 2014-04-14 MED ORDER — ONDANSETRON HCL 4 MG/2ML IJ SOLN: 4.0000 mg | Freq: Four times a day (QID) | INTRAMUSCULAR | Status: DC | PRN

## 2014-04-14 MED ORDER — OXYCODONE-ACETAMINOPHEN 5-325 MG PO TABS
1.0000 | ORAL_TABLET | Freq: Four times a day (QID) | ORAL | Status: DC | PRN
  Administered 2014-04-14 – 2014-04-15 (×3): 1 via ORAL
  Filled 2014-04-14 (×3): qty 1

## 2014-04-14 MED ORDER — HYDRALAZINE HCL 20 MG/ML IJ SOLN: 10.0000 mg | INTRAMUSCULAR | Status: DC | PRN

## 2014-04-14 MED ORDER — DIAZEPAM 5 MG PO TABS
5.0000 mg | ORAL_TABLET | Freq: Every day | ORAL | Status: DC
  Administered 2014-04-14: 5 mg via ORAL
  Filled 2014-04-14: qty 1

## 2014-04-14 MED ORDER — ACETAMINOPHEN 325 MG PO TABS
650.0000 mg | ORAL_TABLET | Freq: Four times a day (QID) | ORAL | Status: DC | PRN
Start: 2014-04-14 — End: 2014-04-15

## 2014-04-14 MED ORDER — LORATADINE 10 MG PO TABS
10.0000 mg | ORAL_TABLET | Freq: Every morning | ORAL | Status: DC
  Administered 2014-04-14 – 2014-04-15 (×2): 10 mg via ORAL
  Filled 2014-04-14 (×2): qty 1

## 2014-04-14 NOTE — Progress Notes (Signed)
Utilization review completed.  

## 2014-04-14 NOTE — Progress Notes (Signed)
Patient seen and examined. Admitted after midnight due to Headache, Nausea and vomiting. CT scan of head negative for acute intracranial abnormalities. Patient ESR elevated and with PMH significant for temporal arteritis. Neurology has been consulted and patient started on prednisone. On my evaluation she denies HA's. On exam no fluid overload, in fact slightly dehydrated. Please referred to Dr. Moise Boring H&P for further info/details on admission and A/P (which I agree with it).  Plan: -holding nephrotoxic agents -gentle hydration -empiric steroids therapy -follow neurology rec's -if renal function worsen despite hydration and holding nephrotoxic will need renal service consultation. (patient is currently in the process of starting permanent assess as she is pretty much imminent HD in near future)   Barton Dubois [phone]

## 2014-04-14 NOTE — H&P (Signed)
[facility] History and Physical  Savannah Lindsey DDU:[id number] DOB: [birth year] DOA: 04/13/2014  Referring physician: ER physician. PCP: Hayden Rasmussen., MD   Chief Complaint: Headache with nausea and vomiting.  HPI: Savannah Lindsey is a 68 y.o. 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 Patient's symptoms have been present since Friday after the AV fistula procedure. Denies any abdominal pain chest pain or shortness of breath. Denies any diarrhea fever chills. In the ER patient's labs reveal worsening of patient's creatinine. CTA did not show anything acute and on exam patient is nonfocal. Patient's sedimentation rate has been elevated and patient has been off prednisone for more than 2 years for her temporal arteritis. Patient was given Reglan in the ER following which her headache has improved but still persist. Patient has vision problems in the right eye from previous stroke.   Review of Systems: As presented in the history of presenting illness, rest negative.  Past Medical History  Diagnosis Date  . Hypertension   . COPD (chronic obstructive pulmonary disease)   . GERD (gastroesophageal reflux disease)   . Hypercholesteremia   . Anxiety   . Stroke   . Headaches due to old head injury   . Blind one eye   . Angina   . Pneumonia   . Recurrent upper respiratory infection (URI)   . Anemia   . Arthritis   . Complication of anesthesia     difficulty waking up  . Chronic kidney disease   . Cancer     skin cancer right thumb  . Temporal arteritis 11/17/2011  . Gastric polyp     antral  . Stenosis of esophagus   . Diabetes mellitus     TYpe 2 IDDM x 11 years  . Bursitis   . Shortness of breath     uses O2 2 L at night   Past Surgical History  Procedure Laterality  Date  . Abdominal hysterectomy    . Cholecystectomy    . Heel spur surgery      bilateral  . Esophagogastroduodenoscopy  11/18/2011    Procedure: ESOPHAGOGASTRODUODENOSCOPY (EGD);  Surgeon: Gatha Mayer, MD;  Location: [facility] ENDOSCOPY;  Service: Endoscopy;  Laterality: N/A;  . Eye surgery      cataracts/IOL  . Brain surgery      temporal arteritis  . Av fistula placement Right 08/14/2013    Procedure: ARTERIOVENOUS (AV) FISTULA CREATION;  Surgeon: Serafina Mitchell, MD;  Location: [place];  Service: Vascular;  Laterality: Right;  . Av fistula placement Right 08/14/2013    Procedure:  CREATION BRACHIOCEPHALIC ARTERIOVENOUS (AV) FISTULA;  Surgeon: Serafina Mitchell, MD;  Location: [place];  Service: Vascular;  Laterality: Right;  . Fistula superficialization Right 01/08/2014    Procedure: FISTULA SUPERFICIALIZATION;  Surgeon: Mal Misty, MD;  Location: [place];  Service: Vascular;  Laterality: Right;  . Colonoscopy    . Appendectomy    . Bascilic vein transposition Right 04/09/2014    Procedure: RIGHT ARM [place];  Surgeon: Mal Misty, MD;  Location: [place];  Service: Vascular;  Laterality: Right;   Social History:  reports that she has never smoked. She has never used smokeless tobacco. She reports that she does not drink alcohol or use illicit drugs. Where does patient live home. Can patient participate in  ADLs? Yes.  Allergies  Allergen Reactions  . Codeine Itching and Nausea And Vomiting  . Tramadol Itching    Family History:  Family History  Problem Relation Age of Onset  . Colon cancer    . Diabetes Brother   . Hypertension Mother   . COPD Father   . Diabetes Father       Prior to Admission medications   Medication Sig Start Date End Date Taking? Authorizing Provider  acetaminophen (TYLENOL) 500 MG tablet Take 1,000 mg by mouth once.   Yes Historical Provider, MD  albuterol (PROVENTIL) (2.5 MG/3ML) 0.083% nebulizer solution Take 2.5 mg by nebulization every  6 (six) hours as needed for wheezing. For wheezing   Yes Historical Provider, MD  aspirin 325 MG tablet Take 325 mg by mouth daily.   Yes Historical Provider, MD  diazepam (VALIUM) 5 MG tablet Take 5 mg by mouth at bedtime. 11/30/13  Yes Richarda Blade, MD  furosemide (LASIX) 40 MG tablet Take 40 mg by mouth daily.   Yes Historical Provider, MD  gabapentin (NEURONTIN) 100 MG capsule Take 200 mg by mouth at bedtime.   Yes Historical Provider, MD  insulin aspart (NOVOLOG) 100 UNIT/ML injection Inject 13-16 Units into the skin daily as needed. Per sliding scale   Yes Historical Provider, MD  insulin detemir (LEVEMIR) 100 UNIT/ML injection Inject 30 Units into the skin at bedtime.    Yes Historical Provider, MD  loratadine (CLARITIN) 10 MG tablet Take 10 mg by mouth every morning.    Yes Historical Provider, MD  losartan (COZAAR) 25 MG tablet Take 25 mg by mouth daily.   Yes Historical Provider, MD  lubiprostone (AMITIZA) 24 MCG capsule Take 24 mcg by mouth daily with breakfast.   Yes Historical Provider, MD  oxyCODONE-acetaminophen (ROXICET) 5-325 MG per tablet Take 1 tablet by mouth every 6 (six) hours as needed for severe pain. 04/09/14  Yes Samantha J Rhyne, PA-C  pantoprazole (PROTONIX) 40 MG tablet Take 40 mg by mouth every morning.   Yes Historical Provider, MD  potassium chloride (K-DUR) 10 MEQ tablet Take 10 mEq by mouth every morning.   Yes Historical Provider, MD    Physical Exam: Filed Vitals:   04/14/14 0000 04/14/14 0100 04/14/14 0115 04/14/14 0143  BP: 145/66 149/78 148/62 124/75  Pulse: 83  86 87  Temp:    97.8 F (36.6 C)  TempSrc:      Resp: 11 14 15 15   Height:      Weight:      SpO2: 97%  92% 98%     General:  Well-developed well-nourished.  Eyes: Anicteric no pallor.  ENT: No discharge from ears eyes nose mouth.  Neck: No mass felt no neck rigidity.  Cardiovascular: S1-S2 heard.  Respiratory: No rhonchi or crepitations.  Abdomen: Soft nontender bowel sounds  present. No guarding or rigidity.  Skin: No rash.  Musculoskeletal: No edema.  Psychiatric: Appears normal.  Neurologic: Alert awake oriented to time place and person. Moves all extremities 5 x 5. No facial asymmetry. No tongue deviation. PERRLA positive.  Labs on Admission:  Basic Metabolic Panel:  Recent Labs Lab 04/09/14 0647 04/13/14 2126  NA 145 139  K 3.9 4.4  CL  --  103  CO2  --  18*  GLUCOSE 136* 111*  BUN  --  55*  CREATININE  --  5.98*  CALCIUM  --  9.2   Liver Function Tests:  Recent Labs Lab 04/13/14 2126  AST  15  ALT 32  ALKPHOS 193*  BILITOT 0.4  PROT 7.6  ALBUMIN 3.2*   No results found for this basename: LIPASE, AMYLASE,  in the last 168 hours No results found for this basename: AMMONIA,  in the last 168 hours CBC:  Recent Labs Lab 04/08/14 0928 04/09/14 0647 04/13/14 2126  WBC  --   --  11.6*  NEUTROABS  --   --  8.9*  HGB 11.0* 10.5* 10.2*  HCT  --  31.0* 32.2*  MCV  --   --  92.5  PLT  --   --  267   Cardiac Enzymes: No results found for this basename: CKTOTAL, CKMB, CKMBINDEX, TROPONINI,  in the last 168 hours  BNP (last 3 results) No results found for this basename: PROBNP,  in the last 8760 hours CBG:  Recent Labs Lab 04/09/14 0558 04/09/14 0948  GLUCAP 138* 138*    Radiological Exams on Admission: Ct Head Wo Contrast  04/13/2014   CLINICAL DATA:  Weakness. Headache. Nausea. History of temporal arteritis. Initial encounter.  EXAM: CT HEAD WITHOUT CONTRAST  TECHNIQUE: Contiguous axial images were obtained from the base of the skull through the vertex without intravenous contrast.  COMPARISON:  05/24/2013; brain MRI -05/25/2013  FINDINGS: Rather extensive periventricular hypodensities appear grossly unchanged, right greater than left. Old infarct involving the left occipital lobe (image 11, series 201). Gray-white differentiation is otherwise well maintained. No CT evidence acute large territory infarct. No intraparenchymal  extra-axial mass or hemorrhage. Unchanged size and configuration of the ventricles and basilar cisterns. No midline shift. Intracranial atherosclerosis. Limited visualization the paranasal sinuses and mastoid air cells are normal. Regional soft tissues appear normal. Post bilateral cataract surgery. No displaced calvarial fracture.  IMPRESSION: 1. No acute intracranial process. 2. Similar findings of microvascular ischemic disease and prior infarct involving the left occipital lobe.   Electronically Signed   By: Sandi Mariscal M.D.   On: 04/13/2014 22:58   Dg Chest Portable 1 View  04/13/2014   CLINICAL DATA:  Weakness and nausea.  Headache.  Initial encounter.  EXAM: PORTABLE CHEST - 1 VIEW  COMPARISON:  05/25/2013  FINDINGS: Normal heart size for technique. Negative aortic contours. There is mild coarsening of basilar lung markings without edema or definitive consolidation. No effusion or pneumothorax.  IMPRESSION: No edema or definitive pneumonia. If ongoing concern for pneumonia, suggest lateral imaging.   Electronically Signed   By: Jorje Guild M.D.   On: 04/13/2014 21:16     Assessment/Plan Principal Problem:   Headache Active Problems:   HTN (hypertension)   Acute on chronic kidney failure   Diabetes mellitus type 2, controlled   1. Headache with nausea and vomiting - given that patient has history of temporal arteritis and at this time has elevated sedimentation rate primary concern is for any recurrence of temporal arteritis. I have consulted neurologist Dr. Janann Colonel who will be seeing patient in consult and we will follow Dr. Hazle Quant recommendations. 2. 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 Could also be from progressively worsening kidney function. At this time we will hold off patient's Lasix and Diovan. Closely follow metabolic panel and intake  output. 3. Hypertension - hold Diovan (see #2) and patient will be on when necessary IV hydralazine. 4. Diabetes mellitus type 2 - patient has not been taking her long-acting insulin and and was only on sliding-scale  recently because of low blood sugar. For now I have placed patient on sliding-scale insulin. 5. History of CVA - on aspirin. 6. Chronic anemia - follow CBC.    Code Status: Full code.  Family Communication: Patient's son at the bedside.  Disposition Plan: Admit to inpatient.    Kahley Leib N. [facility] Pager [phone].  If 7PM-7AM, please contact night-coverage [email or web address] Password [email or web address] 04/14/2014, 3:27 AM

## 2014-04-14 NOTE — ED Notes (Signed)
Savannah Hope, MD told the pt that she needs an IV to go upstairs. Pt initially refused, however, consented when the doctor stated that "someone else" would be starting the IV. IV team paged.

## 2014-04-14 NOTE — ED Notes (Signed)
Pt given water. Pt took a sip of water and choked. No signs of aspiration, lung sounds clear. Pt recovered after a few minutes of coughing.

## 2014-04-14 NOTE — ED Notes (Signed)
Pt's dialysis graft with good bruit and thrill.

## 2014-04-14 NOTE — Consult Note (Signed)
Consult  Reason for Consult:headache in patient with history of GCA  Referring Physician: Dr Hal Hope  CC: headache  HPI: Savannah Lindsey is an 68 y.o. female  with past medical history of hypertension, COPD, high cholesterol, temporal arteritis comes in with chief complaint of headache. Patient states she's had headache for about 4 days. States it hurts over her left temporal area. The headache is similar with with previous temporal arteritis headaches. She also endorsed some nausea and vomiting as well. Was given reglan in ED which improved symptoms but did not fully resolve headache. She notes baseline difficulty with vision in her right eye from a prior stroke. Otherwise no change with vision. She states she's had these symptoms before. Has been on prednisone in the past, last dose 2 years ago. She reports having temporal artery biopsy which confirmed the diagnosis.   Denies any chest pain or trouble breathing. Denies any abdominal pain or urinary symptoms. Denies numbness weakness. Still able to ambulate. Patient is in the process of being transitioned to dialysis. She has had multiple problems with her right AV fistula including a recent admission for surgical revision.   Past Medical History  Diagnosis Date  . Hypertension   . COPD (chronic obstructive pulmonary disease)   . GERD (gastroesophageal reflux disease)   . Hypercholesteremia   . Anxiety   . Stroke   . Headaches due to old head injury   . Blind one eye   . Angina   . Pneumonia   . Recurrent upper respiratory infection (URI)   . Anemia   . Arthritis   . Complication of anesthesia     difficulty waking up  . Chronic kidney disease   . Cancer     skin cancer right thumb  . Temporal arteritis 11/17/2011  . Gastric polyp     antral  . Stenosis of esophagus   . Diabetes mellitus     TYpe 2 IDDM x 11 years  . Bursitis   . Shortness of breath     uses O2 2 L at night    Past Surgical History  Procedure  Laterality Date  . Abdominal hysterectomy    . Cholecystectomy    . Heel spur surgery      bilateral  . Esophagogastroduodenoscopy  11/18/2011    Procedure: ESOPHAGOGASTRODUODENOSCOPY (EGD);  Surgeon: Gatha Mayer, MD;  Location: [facility] ENDOSCOPY;  Service: Endoscopy;  Laterality: N/A;  . Eye surgery      cataracts/IOL  . Brain surgery      temporal arteritis  . Av fistula placement Right 08/14/2013    Procedure: ARTERIOVENOUS (AV) FISTULA CREATION;  Surgeon: Serafina Mitchell, MD;  Location: [place];  Service: Vascular;  Laterality: Right;  . Av fistula placement Right 08/14/2013    Procedure:  CREATION BRACHIOCEPHALIC ARTERIOVENOUS (AV) FISTULA;  Surgeon: Serafina Mitchell, MD;  Location: [place];  Service: Vascular;  Laterality: Right;  . Fistula superficialization Right 01/08/2014    Procedure: FISTULA SUPERFICIALIZATION;  Surgeon: Mal Misty, MD;  Location: [place];  Service: Vascular;  Laterality: Right;  . Colonoscopy    . Appendectomy    . Bascilic vein transposition Right 04/09/2014    Procedure: RIGHT ARM [place];  Surgeon: Mal Misty, MD;  Location: [place];  Service: Vascular;  Laterality: Right;    Family History  Problem Relation Age of Onset  . Colon cancer    . Diabetes Brother   . Hypertension Mother   .  COPD Father   . Diabetes Father     Social History:  reports that she has never smoked. She has never used smokeless tobacco. She reports that she does not drink alcohol or use illicit drugs.  Allergies  Allergen Reactions  . Codeine Itching and Nausea And Vomiting  . Tramadol Itching    Medications:  Scheduled: . aspirin  325 mg Oral Daily  . diazepam  5 mg Oral QHS  . enoxaparin (LOVENOX) injection  30 mg Subcutaneous Q24H  . gabapentin  200 mg Oral QHS  . insulin aspart  0-9 Units Subcutaneous TID WC  . loratadine  10 mg Oral q morning - 10a  . lubiprostone  24 mcg Oral Q breakfast  . pantoprazole  40 mg Oral q morning - 10a  . sodium  chloride  3 mL Intravenous Q12H    ROS: Out of a complete 14 system review, the patient complains of only the following symptoms, and all other reviewed systems are negative.  Physical Examination: Blood pressure 124/75, pulse 87, temperature 97.8 F (36.6 C), temperature source Oral, resp. rate 15, height 5' (1.524 m), weight 84.369 kg (186 lb), SpO2 98.00%.  Neurologic Examination Mild left temporal tenderness Mental Status: Alert, oriented, thought content appropriate.  Speech fluent without evidence of aphasia.  Able to follow 3 step commands without difficulty. Cranial Nerves: II: unable to fully visualize fundus due to pupil size, visual fields grossly normal, pupils equal, round, reactive to light  III,IV, VI: ptosis not present, extra-ocular motions intact bilaterally V,VII: smile symmetric, facial light touch sensation normal bilaterally VIII: hearing normal bilaterally IX,X: gag reflex present XI: trapezius strength/neck flexion strength normal bilaterally XII: tongue strength normal  Motor: Right : Upper extremity    Left:     Upper extremity 5/5 deltoid       5/5 deltoid 5/5 biceps      5/5 biceps  5/5 triceps      5/5 triceps 5/5wrist flexion     5/5 wrist flexion 5/5 wrist extension     5/5 wrist extension 5/5 hand grip      5/5 hand grip  Lower extremity     Lower extremity 5/5 hip flexor      5/5 hip flexor 5/5 hip adductors     5/5 hip adductors 5/5 hip abductors     5/5 hip abductors 5/5 quadricep      5/5 quadriceps  5/5 hamstrings     5/5 hamstrings 5/5 plantar flexion       5/5 plantar flexion 5/5 plantar extension     5/5 plantar extension Tone and bulk:normal tone throughout; no atrophy noted Sensory: Pinprick and light touch intact throughout, bilaterally Deep Tendon Reflexes: 2+ and symmetric throughout Plantars: Right: downgoing   Left: downgoing Cerebellar: normal finger-to-nose, normal rapid alternating movements and normal heel-to-shin  test Gait: deferral due to lead placement  Laboratory Studies:   Basic Metabolic Panel:  Recent Labs Lab 04/09/14 0647 04/13/14 2126  NA 145 139  K 3.9 4.4  CL  --  103  CO2  --  18*  GLUCOSE 136* 111*  BUN  --  55*  CREATININE  --  5.98*  CALCIUM  --  9.2    Liver Function Tests:  Recent Labs Lab 04/13/14 2126  AST 15  ALT 32  ALKPHOS 193*  BILITOT 0.4  PROT 7.6  ALBUMIN 3.2*   No results found for this basename: LIPASE, AMYLASE,  in the last 168 hours No results  found for this basename: AMMONIA,  in the last 168 hours  CBC:  Recent Labs Lab 04/08/14 0928 04/09/14 0647 04/13/14 2126  WBC  --   --  11.6*  NEUTROABS  --   --  8.9*  HGB 11.0* 10.5* 10.2*  HCT  --  31.0* 32.2*  MCV  --   --  92.5  PLT  --   --  267    Cardiac Enzymes: No results found for this basename: CKTOTAL, CKMB, CKMBINDEX, TROPONINI,  in the last 168 hours  BNP: No components found with this basename: POCBNP,   CBG:  Recent Labs Lab 04/09/14 0558 04/09/14 0948  GLUCAP 138* 138*    Microbiology: Results for orders placed during the hospital encounter of 05/24/13  URINE CULTURE     Status: None   Collection Time    05/25/13  1:22 AM      Result Value Ref Range Status   Specimen Description URINE, RANDOM   Final   Special Requests ADDED 0157   Final   Culture  Setup Time     Final   Value: 05/25/2013 02:01     Performed at [place] [facility]     Final   Value: 25,000 COLONIES/ML     Performed at [facility]   Culture     Final   Value: DIPHTHEROIDS(CORYNEBACTERIUM SPECIES)     Note: Standardized susceptibility testing for this organism is not available.     Performed at [facility]   Report Status 05/26/2013 FINAL   Final    Coagulation Studies: No results found for this basename: LABPROT, INR,  in the last 72 hours  Urinalysis:  Recent Labs Lab 04/13/14 2146  COLORURINE YELLOW  LABSPEC 1.011  PHURINE 5.0  GLUCOSEU  NEGATIVE  HGBUR SMALL*  BILIRUBINUR NEGATIVE  KETONESUR NEGATIVE  PROTEINUR 30*  UROBILINOGEN 0.2  NITRITE NEGATIVE  LEUKOCYTESUR NEGATIVE    Lipid Panel:     Component Value Date/Time   CHOL 150 05/25/2013 0055   TRIG 305* 05/25/2013 0055   HDL 24* 05/25/2013 0055   CHOLHDL 6.3 05/25/2013 0055   VLDL 61* 05/25/2013 0055   LDLCALC 65 05/25/2013 0055    HgbA1C:  Lab Results  Component Value Date   HGBA1C 7.2* 05/25/2013    Urine Drug Screen:     Component Value Date/Time   LABOPIA NONE DETECTED 05/25/2013 0122   COCAINSCRNUR NONE DETECTED 05/25/2013 0122   LABBENZ NONE DETECTED 05/25/2013 0122   AMPHETMU NONE DETECTED 05/25/2013 0122   THCU NONE DETECTED 05/25/2013 0122   LABBARB NONE DETECTED 05/25/2013 0122    Alcohol Level: No results found for this basename: ETH,  in the last 168 hours  Other results:  Imaging: Ct Head Wo Contrast  04/13/2014   CLINICAL DATA:  Weakness. Headache. Nausea. History of temporal arteritis. Initial encounter.  EXAM: CT HEAD WITHOUT CONTRAST  TECHNIQUE: Contiguous axial images were obtained from the base of the skull through the vertex without intravenous contrast.  COMPARISON:  05/24/2013; brain MRI -05/25/2013  FINDINGS: Rather extensive periventricular hypodensities appear grossly unchanged, right greater than left. Old infarct involving the left occipital lobe (image 11, series 201). Gray-white differentiation is otherwise well maintained. No CT evidence acute large territory infarct. No intraparenchymal extra-axial mass or hemorrhage. Unchanged size and configuration of the ventricles and basilar cisterns. No midline shift. Intracranial atherosclerosis. Limited visualization the paranasal sinuses and mastoid air cells are normal. Regional soft tissues appear normal.  Post bilateral cataract surgery. No displaced calvarial fracture.  IMPRESSION: 1. No acute intracranial process. 2. Similar findings of microvascular ischemic disease and prior  infarct involving the left occipital lobe.   Electronically Signed   By: Sandi Mariscal M.D.   On: 04/13/2014 22:58   Dg Chest Portable 1 View  04/13/2014   CLINICAL DATA:  Weakness and nausea.  Headache.  Initial encounter.  EXAM: PORTABLE CHEST - 1 VIEW  COMPARISON:  05/25/2013  FINDINGS: Normal heart size for technique. Negative aortic contours. There is mild coarsening of basilar lung markings without edema or definitive consolidation. No effusion or pneumothorax.  IMPRESSION: No edema or definitive pneumonia. If ongoing concern for pneumonia, suggest lateral imaging.   Electronically Signed   By: Jorje Guild M.D.   On: 04/13/2014 21:16     Assessment/Plan:  68y/o woman with prior history of temporal arteritis, biopsy confirmed, presenting with refractory headache which she reports is similar to prior temporal arteritis headaches. Denies any acute vision changes. Took prednisone in the past with good benefit. She is also suffering from acute on chronic kidney disease. ESR elevated at 65 raising concern for recurrence of his temporal arteritis. Suggest starting prednisone 50m daily. Neurology will continue to follow.   PJim Like DO [facility] [phone] If 7pm- 7am, please page neurology on call as listed in [facility] 04/14/2014, 4:53 AM

## 2014-04-14 NOTE — ED Notes (Signed)
Kak[place]andy, MD at bedside 

## 2014-04-14 NOTE — ED Notes (Addendum)
This RN spoke with IV team, who states that he is unable to see the pt in the ER at this time. IV team instructed this RN to send the pt to the floor, and he will start the IV there. Hoyle Sauer, RN on 5N is aware.

## 2014-04-14 NOTE — ED Notes (Signed)
IV team to see the pt.

## 2014-04-15 ENCOUNTER — Encounter (HOSPITAL_COMMUNITY): Payer: Self-pay | Admitting: General Practice

## 2014-04-15 DIAGNOSIS — G441 Vascular headache, not elsewhere classified: Secondary | ICD-10-CM

## 2014-04-15 DIAGNOSIS — N184 Chronic kidney disease, stage 4 (severe): Secondary | ICD-10-CM

## 2014-04-15 LAB — GLUCOSE, CAPILLARY
GLUCOSE-CAPILLARY: 141 mg/dL — AB (ref 70–99)
GLUCOSE-CAPILLARY: 152 mg/dL — AB (ref 70–99)

## 2014-04-15 MED ORDER — ACETAMINOPHEN 325 MG PO TABS: 650.0000 mg | ORAL_TABLET | Freq: Four times a day (QID) | ORAL | Status: AC | PRN

## 2014-04-15 MED ORDER — PREDNISONE 20 MG PO TABS: 40.0000 mg | ORAL_TABLET | Freq: Every day | ORAL | Status: AC

## 2014-04-15 NOTE — Discharge Instructions (Addendum)
Please follow up with neurologist in 2-3 weeks  Please follow up with nephrology in 1-2 week

## 2014-04-15 NOTE — Progress Notes (Signed)
Received referral for [facility] Management services. Confirmed at bedside that patient's Primary Care MD is Dr Horald Pollen who is not a [facility] provider. Therefore, patient not eligible for [facility] Care Management at this time.  Marthenia Rolling, MSN- [facility] [phone]

## 2014-04-15 NOTE — Discharge Summary (Signed)
Physician Discharge Summary  Savannah Lindsey QJF:[id number] DOB: Nov 17, 1945 DOA: 04/13/2014  PCP: Hayden Rasmussen., MD  Admit date: 04/13/2014 Discharge date: 04/15/2014  Time spent: >35 minutes  Recommendations for Outpatient Follow-up:  F/u with nephrology in 1-2 week (Patient has an appointment) F/u with PCP in 1 week F/u with neurology in 2-3 weeks   Discharge Diagnoses:  Principal Problem:   Headache Active Problems:   HTN (hypertension)   Acute on chronic kidney failure   Diabetes mellitus type 2, controlled   Discharge Condition: stable   Diet recommendation: low sodium  Filed Weights   04/13/14 2027  Weight: 84.369 kg (186 lb)    History of present illness:  68 y.o. 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 Patient's symptoms have been present since Friday after the AV fistula procedure. Denies any abdominal pain chest pain or shortness of breath. Denies any diarrhea fever chills. In the ER patient's labs reveal worsening of patient's creatinine. CTA did not show anything acute and on exam patient is nonfocal. Patient's sedimentation rate has been elevated and patient has been off prednisone for more than 2 years for her temporal arteritis   Hospital Course:  1. Headache with nausea and vomiting - given that patient has history of temporal arteritis and at this time has elevated sedimentation rate primary concern is for any recurrence of temporal arteritis. -headache resolved; neuro exam no focal; d/w neurology. Dr. Leonel Ramsay who recommended to restart prednisone; d/w patient, cont outpatient follow up in 2-3 weeks with neurology  2. CKD 4-5; not uremic, not anuric yet, no s/s of fluid overload on exam;  -  Pt is in the process of starting HD; Pt has appointment  with nephrologist next week; cont outpatient follow up, labs next week; likely needs HD in near future; hold losartan; BP is stable    3. Diabetes mellitus type 2 - patient has not been taking her long-acting insulin and and was only on sliding-scale recently because of low blood sugar. For now  Cont sliding-scale insulin. Outpatient follow up  4. History of CVA - on aspirin. 5. Chronic anemia - no s/s of acute bleeding; may need TF prn with HD in near future    Procedures:  none (i.e. Studies not automatically included, echos, thoracentesis, etc; not x-rays)  Consultations:  Neurology   Discharge Exam: Filed Vitals:   04/15/14 0451  BP: 131/66  Pulse: 80  Temp: 97.7 F (36.5 C)  Resp: 15    General: alert Cardiovascular: s1,s2 rrr Respiratory: CTA BL  Discharge Instructions  Discharge Instructions   Diet - low sodium heart healthy    Complete by:  As directed      Discharge instructions    Complete by:  As directed   Please follow up with neurologist in 2-3 weeks  Please follow up with nephrology in 1-2 week     Increase activity slowly    Complete by:  As directed             Medication List    STOP taking these medications       insulin detemir 100 UNIT/ML injection  Commonly known as:  LEVEMIR     losartan 25 MG tablet  Commonly known as:  COZAAR      TAKE these medications  acetaminophen 325 MG tablet  Commonly known as:  TYLENOL  Take 2 tablets (650 mg total) by mouth every 6 (six) hours as needed for mild pain (or Fever >/= 101).     albuterol (2.5 MG/3ML) 0.083% nebulizer solution  Commonly known as:  PROVENTIL  Take 2.5 mg by nebulization every 6 (six) hours as needed for wheezing. For wheezing     aspirin 325 MG tablet  Take 325 mg by mouth daily.     diazepam 5 MG tablet  Commonly known as:  VALIUM  Take 5 mg by mouth at bedtime.     furosemide 40 MG tablet  Commonly known as:  LASIX  Take 40 mg by mouth daily.     gabapentin  100 MG capsule  Commonly known as:  NEURONTIN  Take 200 mg by mouth at bedtime.     insulin aspart 100 UNIT/ML injection  Commonly known as:  novoLOG  Inject 13-16 Units into the skin daily as needed. Per sliding scale     loratadine 10 MG tablet  Commonly known as:  CLARITIN  Take 10 mg by mouth every morning.     lubiprostone 24 MCG capsule  Commonly known as:  AMITIZA  Take 24 mcg by mouth daily with breakfast.     oxyCODONE-acetaminophen 5-325 MG per tablet  Commonly known as:  ROXICET  Take 1 tablet by mouth every 6 (six) hours as needed for severe pain.     pantoprazole 40 MG tablet  Commonly known as:  PROTONIX  Take 40 mg by mouth every morning.     potassium chloride 10 MEQ tablet  Commonly known as:  K-DUR  Take 10 mEq by mouth every morning.     predniSONE 20 MG tablet  Commonly known as:  DELTASONE  Take 2 tablets (40 mg total) by mouth daily with breakfast.       Allergies  Allergen Reactions  . Codeine Itching and Nausea And Vomiting  . Tramadol Itching       Follow-up Information   Follow up with [facility] L., MD. Schedule an appointment as soon as possible for a visit in 1 week.   Specialty:  Family Medicine   Contact information:   [address] [place] [address] [phone]       Follow up with [facility]. Schedule an appointment as soon as possible for a visit in 3 weeks.   Contact information:   [place] [address] [phone]       The results of significant diagnostics from this hospitalization (including imaging, microbiology, ancillary and laboratory) are listed below for reference.    Significant Diagnostic Studies: Ct Head Wo Contrast  04/13/2014   CLINICAL DATA:  Weakness. Headache. Nausea. History of temporal arteritis. Initial encounter.  EXAM: CT HEAD WITHOUT CONTRAST  TECHNIQUE: Contiguous axial images were obtained from the base of the skull through the  vertex without intravenous contrast.  COMPARISON:  05/24/2013; brain MRI -05/25/2013  FINDINGS: Rather extensive periventricular hypodensities appear grossly unchanged, right greater than left. Old infarct involving the left occipital lobe (image 11, series 201). Gray-white differentiation is otherwise well maintained. No CT evidence acute large territory infarct. No intraparenchymal extra-axial mass or hemorrhage. Unchanged size and configuration of the ventricles and basilar cisterns. No midline shift. Intracranial atherosclerosis. Limited visualization the paranasal sinuses and mastoid air cells are normal. Regional soft tissues appear normal. Post bilateral cataract surgery. No displaced calvarial fracture.  IMPRESSION:  1. No acute intracranial process. 2. Similar findings of microvascular ischemic disease and prior infarct involving the left occipital lobe.   Electronically Signed   By: Sandi Mariscal M.D.   On: 04/13/2014 22:58   Dg Chest Portable 1 View  04/13/2014   CLINICAL DATA:  Weakness and nausea.  Headache.  Initial encounter.  EXAM: PORTABLE CHEST - 1 VIEW  COMPARISON:  05/25/2013  FINDINGS: Normal heart size for technique. Negative aortic contours. There is mild coarsening of basilar lung markings without edema or definitive consolidation. No effusion or pneumothorax.  IMPRESSION: No edema or definitive pneumonia. If ongoing concern for pneumonia, suggest lateral imaging.   Electronically Signed   By: Jorje Guild M.D.   On: 04/13/2014 21:16    Microbiology: No results found for this or any previous visit (from the past 240 hour(s)).   Labs: Basic Metabolic Panel:  Recent Labs Lab 04/09/14 0647 04/13/14 2126 04/14/14 0605  NA 145 139 140  K 3.9 4.4 3.9  CL  --  103 106  CO2  --  18* 17*  GLUCOSE 136* 111* 97  BUN  --  55* 52*  CREATININE  --  5.98* 5.82*  CALCIUM  --  9.2 8.9   Liver Function Tests:  Recent Labs Lab 04/13/14 2126 04/14/14 0605  AST 15 12  ALT 32 27   ALKPHOS 193* 177*  BILITOT 0.4 0.4  PROT 7.6 7.1  ALBUMIN 3.2* 3.0*   No results found for this basename: LIPASE, AMYLASE,  in the last 168 hours No results found for this basename: AMMONIA,  in the last 168 hours CBC:  Recent Labs Lab 04/09/14 0647 04/13/14 2126 04/14/14 0605  WBC  --  11.6* 10.0  NEUTROABS  --  8.9* 7.2  HGB 10.5* 10.2* 9.7*  HCT 31.0* 32.2* 30.9*  MCV  --  92.5 94.5  PLT  --  267 254   Cardiac Enzymes: No results found for this basename: CKTOTAL, CKMB, CKMBINDEX, TROPONINI,  in the last 168 hours BNP: BNP (last 3 results) No results found for this basename: PROBNP,  in the last 8760 hours CBG:  Recent Labs Lab 04/14/14 1123 04/14/14 1603 04/14/14 2218 04/15/14 0645 04/15/14 1134  GLUCAP 127* 224* 190* 141* 152*       Signed:  Anniya Whiters N  [facility] 04/15/2014, 11:41 AM

## 2014-04-15 NOTE — Clinical Documentation Improvement (Signed)
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  Please provide specific stage of chronic kidney disease in your progress note and carry over to the discharge summary.    --Chronic kidney disease, stage 4 (severe) - GFR 15-29 --Chronic kidney disease, stage 5- GFR < 15  Thank you, Mateo Flow, RN [phone] Clinical Documentation Specialist

## 2014-04-15 NOTE — ED Provider Notes (Signed)
I saw and evaluated the patient, reviewed the resident's note and I agree with the findings and plan.   EKG Interpretation   Date/Time:  Monday April 13 2014 21:00:55 EDT Ventricular Rate:  85 PR Interval:  185 QRS Duration: 86 QT Interval:  396 QTC Calculation: 471 R Axis:   -33 Text Interpretation:  Sinus rhythm Left axis deviation Consider anterior  infarct ED PHYSICIAN INTERPRETATION AVAILABLE IN [facility] Confirmed  by TEST, Record ([id number]) on 04/15/2014 7:25:40 AM     Briefly, pt is a 68 y.o. female presenting with recurrent ha and dizziness.  On speaking with her son, the pt has chronic ha x 1 year, and her particular ha today is without difference from prior.  Her primary precipitant for the visit today was that her son made her come in.  I performed an examination on the patient including cardiac, pulmonary, and gi systems which were unremarkable, including a neuro exam.  Labs with AKI.  Admitted in stable condition.     Debby Freiberg, MD 04/15/14 [phone]

## 2014-04-15 NOTE — Progress Notes (Signed)
Inpatient Diabetes Program Recommendations  AACE/ADA: New Consensus Statement on Inpatient Glycemic Control (2013)  Target Ranges:  Prepandial:   less than 140 mg/dL      Peak postprandial:   less than 180 mg/dL (1-2 hours)      Critically ill patients:  140 - 180 mg/dL   Reason for Assessment: Results for Savannah Lindsey, Savannah Lindsey (MRN [id number]) as of 04/15/2014 11:00  Ref. Range 04/14/2014 06:33 04/14/2014 11:23 04/14/2014 16:03 04/14/2014 22:18 04/15/2014 06:45  Glucose-Capillary Latest Range: 70-99 mg/dL 94 127 (H) 224 (H) 190 (H) 141 (H)     Diabetes history: Type 2 diabetes Outpatient Diabetes medications: Levemir 30 units q HS, Novolog 13-16 units daily as needed Current orders for Inpatient glycemic control: Novolog sensitive tid with meals.    May consider restarting a portion of patient's home dose of Levemir.  Consider 10 units Levemir daily.    Thanks,  Adah Perl, RN, BC-ADM Inpatient Diabetes Coordinator Pager [phone]

## 2014-04-15 NOTE — Progress Notes (Signed)
Patient evaluated for community based chronic disease management services with [facility] as a benefit of patient's [employer]. Spoke with patient at bedside to explain [place] Management services.  Services have been accepted with written consent.  Patient receives services from [employer] [facility] and Advice worker that visits at home.  Her son, Layla Maw Home: [phone] Mobile: [phone], of the home is her authorized contact.  Her son is deaf.  She would like for [facility] to collaborate with [facility] for home support.  Patient will receive a post discharge transition of care call and will be evaluated for monthly home visits for assessments and disease process education.  Left contact information and [facility] literature at bedside. Made Inpatient Case Manager aware that [place] Management following. Of note, [facility] Care Management services does not replace or interfere with any services that are arranged by inpatient case management or social work.  For additional questions or referrals please contact Corliss Blacker BSN RN [facility] Liaison at [phone].

## 2014-04-15 NOTE — Progress Notes (Signed)
Subjective: No further headaches  She tells me that she has a daily headache that she thinks may have been getting worse, though patient is a poor historian.   Exam: Filed Vitals:   04/15/14 0451  BP: 131/66  Pulse: 80  Temp: 97.7 F (36.5 C)  Resp: 15   Gen: In bed, NAD MS: Awake, alert, interactive and appropriate SY:SDBNR, VFF, EOMI Motor: MAEW   Impression: 68 yo F with previously biopsy proven temporal arteritis(per report). She is a poor historian, but with persistent headaches, elevated ESR, and h/o TA, I would favor restarting prednisone at this time.   Recommendations: 1) Prednisone 37m daily.  2) f/u with outpatient physician who was managing TA.   MRoland Rack MD [facility] [phone] If 7pm- 7am, please page neurology [place] call as [place]

## 2014-04-15 NOTE — Progress Notes (Signed)
D/C instructions reviewed with the Pt and IV removed.Pts son is a the bedside to take Pt home. They both verbalized understanding of home care and felt comfortable with discharge

## 2014-04-29 ENCOUNTER — Encounter (HOSPITAL_COMMUNITY): Payer: Medicare HMO

## 2014-05-13 ENCOUNTER — Other Ambulatory Visit (HOSPITAL_COMMUNITY): Payer: Self-pay | Admitting: *Deleted

## 2014-05-14 ENCOUNTER — Encounter (HOSPITAL_COMMUNITY)
Admission: RE | Admit: 2014-05-14 | Discharge: 2014-05-14 | Disposition: A | Discharge status: Home / Self Care | Payer: Commercial Managed Care - HMO | Source: Ambulatory Visit | Attending: Nephrology | Admitting: Nephrology

## 2014-05-14 DIAGNOSIS — N186 End stage renal disease: Secondary | ICD-10-CM | POA: Insufficient documentation

## 2014-05-14 DIAGNOSIS — I120 Hypertensive chronic kidney disease with stage 5 chronic kidney disease or end stage renal disease: Secondary | ICD-10-CM | POA: Diagnosis not present

## 2014-05-14 DIAGNOSIS — D631 Anemia in chronic kidney disease: Secondary | ICD-10-CM | POA: Diagnosis present

## 2014-05-14 LAB — IRON AND TIBC
Iron: 51 ug/dL (ref 42–135)
Saturation Ratios: 24 % (ref 20–55)
TIBC: 215 ug/dL — ABNORMAL LOW (ref 250–470)
UIBC: 164 ug/dL (ref 125–400)

## 2014-05-14 LAB — POCT HEMOGLOBIN-HEMACUE: Hemoglobin: 9.4 g/dL — ABNORMAL LOW (ref 12.0–15.0)

## 2014-05-14 LAB — FERRITIN: Ferritin: 540 ng/mL — ABNORMAL HIGH (ref 10–291)

## 2014-05-14 MED ORDER — EPOETIN ALFA 20000 UNIT/ML IJ SOLN
INTRAMUSCULAR | Status: AC
  Administered 2014-05-14: 20000 [IU] via SUBCUTANEOUS
  Filled 2014-05-14: qty 1

## 2014-05-14 MED ORDER — EPOETIN ALFA 20000 UNIT/ML IJ SOLN
20000.0000 [IU] | INTRAMUSCULAR | Status: DC
  Administered 2014-05-14: 20000 [IU] via SUBCUTANEOUS

## 2014-05-25 ENCOUNTER — Encounter: Payer: Self-pay | Admitting: Vascular Surgery

## 2014-05-26 ENCOUNTER — Encounter: Payer: Self-pay | Admitting: Vascular Surgery

## 2014-05-26 ENCOUNTER — Ambulatory Visit (INDEPENDENT_AMBULATORY_CARE_PROVIDER_SITE_OTHER): Payer: Self-pay | Admitting: Vascular Surgery

## 2014-05-26 ENCOUNTER — Ambulatory Visit (HOSPITAL_COMMUNITY)
Admission: RE | Admit: 2014-05-26 | Discharge: 2014-05-26 | Disposition: A | Discharge status: Home / Self Care | Payer: Medicare HMO | Source: Ambulatory Visit | Attending: Vascular Surgery | Admitting: Vascular Surgery

## 2014-05-26 VITALS — BP 126/77 | HR 88 | Resp 16 | Ht 60.0 in | Wt 185.0 lb

## 2014-05-26 DIAGNOSIS — Z48812 Encounter for surgical aftercare following surgery on the circulatory system: Secondary | ICD-10-CM | POA: Insufficient documentation

## 2014-05-26 DIAGNOSIS — N186 End stage renal disease: Secondary | ICD-10-CM | POA: Diagnosis not present

## 2014-05-26 NOTE — Progress Notes (Signed)
Subjective:     Patient ID: Stann Ore, female   DOB: 1945-10-09, 68 y.o.   MRN: [id number]  HPI this 68 year old female with stage IV chronic renal insufficiency had right basilic vein transposition performed by me on 04/09/2014. She is having no pain or numbness in the right hand. She does have some occasional aching discomfort on the lateral aspect of the forearm well away from her incisions. Yet on hemodialysis. Review of Systems     Objective:   Physical Exam BP 126/77 mmHg  Pulse 88  Resp 16  Ht 5' (1.524 m)  Wt 185 lb (83.915 kg)  BMI 36.13 kg/m2  Gen. well-developed well-nourished female no apparent stress alert and oriented 3 Right upper extremity with well-healed incisions medial aspect of the right upper arm. The basilic vein transposition is easily palpable with a good pulse and palpable thrill on the anterior aspect of the arm. Radial pulse is 1+ which improves to 2+ with compression of the fistula. No evidence of infection.  Data ordered a duplex scan of the this fistula which reveals it to be widely patent with no irregularities noted.     Assessment:     Nicely functioning right basilic vein transposition created 6 weeks ago in patient not yet on hemodialysis. No symptoms of steal syndrome. Aching discomfort right lateral arm not related to fistula.    Plan:     Will return to see [place] on when necessary basis Okay to use fistula after first of the year 2016.

## 2014-06-03 ENCOUNTER — Other Ambulatory Visit (HOSPITAL_COMMUNITY): Payer: Self-pay | Admitting: *Deleted

## 2014-06-04 ENCOUNTER — Encounter (HOSPITAL_COMMUNITY)
Admission: RE | Admit: 2014-06-04 | Discharge: 2014-06-04 | Disposition: A | Discharge status: Home / Self Care | Payer: Medicare HMO | Source: Ambulatory Visit | Attending: Nephrology | Admitting: Nephrology

## 2014-06-04 DIAGNOSIS — D631 Anemia in chronic kidney disease: Secondary | ICD-10-CM | POA: Diagnosis not present

## 2014-06-04 DIAGNOSIS — I120 Hypertensive chronic kidney disease with stage 5 chronic kidney disease or end stage renal disease: Secondary | ICD-10-CM | POA: Insufficient documentation

## 2014-06-04 DIAGNOSIS — N186 End stage renal disease: Secondary | ICD-10-CM | POA: Diagnosis not present

## 2014-06-04 LAB — POCT HEMOGLOBIN-HEMACUE: Hemoglobin: 10.2 g/dL — ABNORMAL LOW (ref 12.0–15.0)

## 2014-06-04 MED ORDER — SODIUM CHLORIDE 0.9 % IV SOLN
510.0000 mg | Freq: Once | INTRAVENOUS | Status: AC
  Administered 2014-06-04: 510 mg via INTRAVENOUS
  Filled 2014-06-04: qty 17

## 2014-06-04 MED ORDER — EPOETIN ALFA 20000 UNIT/ML IJ SOLN
INTRAMUSCULAR | Status: AC
  Administered 2014-06-04: 20000 [IU]
  Filled 2014-06-04: qty 1

## 2014-06-04 MED ORDER — EPOETIN ALFA 20000 UNIT/ML IJ SOLN: 20000.0000 [IU] | INTRAMUSCULAR | Status: DC

## 2014-06-11 ENCOUNTER — Encounter (HOSPITAL_COMMUNITY): Payer: Self-pay | Admitting: Surgery

## 2014-06-18 ENCOUNTER — Encounter (HOSPITAL_COMMUNITY)
Admission: RE | Admit: 2014-06-18 | Discharge: 2014-06-18 | Disposition: A | Discharge status: Home / Self Care | Payer: Medicare HMO | Source: Ambulatory Visit | Attending: Nephrology | Admitting: Nephrology

## 2014-06-18 DIAGNOSIS — D631 Anemia in chronic kidney disease: Secondary | ICD-10-CM | POA: Diagnosis not present

## 2014-06-18 LAB — IRON AND TIBC
IRON: 68 ug/dL (ref 42–135)
Saturation Ratios: 30 % (ref 20–55)
TIBC: 227 ug/dL — AB (ref 250–470)
UIBC: 159 ug/dL (ref 125–400)

## 2014-06-18 LAB — POCT HEMOGLOBIN-HEMACUE: Hemoglobin: 9.4 g/dL — ABNORMAL LOW (ref 12.0–15.0)

## 2014-06-18 MED ORDER — EPOETIN ALFA 20000 UNIT/ML IJ SOLN
20000.0000 [IU] | INTRAMUSCULAR | Status: DC
  Administered 2014-06-18: 20000 [IU] via SUBCUTANEOUS

## 2014-06-18 MED ORDER — EPOETIN ALFA 20000 UNIT/ML IJ SOLN
INTRAMUSCULAR | Status: AC
  Filled 2014-06-18: qty 1

## 2014-06-19 LAB — FERRITIN: Ferritin: 1078 ng/mL — ABNORMAL HIGH (ref 10–291)

## 2014-06-30 ENCOUNTER — Ambulatory Visit: Payer: Medicare HMO | Admitting: Neurology

## 2014-07-01 ENCOUNTER — Encounter: Payer: Self-pay | Admitting: *Deleted

## 2014-07-01 ENCOUNTER — Telehealth: Payer: Self-pay | Admitting: Neurology

## 2014-07-01 NOTE — Telephone Encounter (Signed)
Pt no showed 07/01/14 appt w/ Dr. Tomi Likens. ER referral.  Savannah Lindsey - please send no show letter to pt / Sherri S.

## 2014-07-01 NOTE — Progress Notes (Signed)
No show letter sent for 06/30/2014

## 2014-07-02 ENCOUNTER — Encounter (HOSPITAL_COMMUNITY)
Admission: RE | Admit: 2014-07-02 | Discharge: 2014-07-02 | Disposition: A | Discharge status: Home / Self Care | Payer: Medicare HMO | Source: Ambulatory Visit | Attending: Nephrology | Admitting: Nephrology

## 2014-07-02 DIAGNOSIS — D631 Anemia in chronic kidney disease: Secondary | ICD-10-CM | POA: Diagnosis not present

## 2014-07-02 LAB — POCT HEMOGLOBIN-HEMACUE: Hemoglobin: 11.1 g/dL — ABNORMAL LOW (ref 12.0–15.0)

## 2014-07-02 MED ORDER — EPOETIN ALFA 40000 UNIT/ML IJ SOLN: 30000.0000 [IU] | INTRAMUSCULAR | Status: DC

## 2014-07-02 MED ORDER — EPOETIN ALFA 20000 UNIT/ML IJ SOLN
INTRAMUSCULAR | Status: AC
  Administered 2014-07-02: 20000 [IU] via SUBCUTANEOUS
  Filled 2014-07-02: qty 1

## 2014-07-02 MED ORDER — EPOETIN ALFA 10000 UNIT/ML IJ SOLN
INTRAMUSCULAR | Status: AC
  Administered 2014-07-02: 10000 [IU] via SUBCUTANEOUS
  Filled 2014-07-02: qty 1

## 2014-07-16 ENCOUNTER — Encounter (HOSPITAL_COMMUNITY)
Admission: RE | Admit: 2014-07-16 | Discharge: 2014-07-16 | Disposition: A | Discharge status: Home / Self Care | Payer: Medicare HMO | Source: Ambulatory Visit | Attending: Nephrology | Admitting: Nephrology

## 2014-07-16 DIAGNOSIS — N186 End stage renal disease: Secondary | ICD-10-CM | POA: Insufficient documentation

## 2014-07-16 DIAGNOSIS — D631 Anemia in chronic kidney disease: Secondary | ICD-10-CM | POA: Insufficient documentation

## 2014-07-16 DIAGNOSIS — I120 Hypertensive chronic kidney disease with stage 5 chronic kidney disease or end stage renal disease: Secondary | ICD-10-CM | POA: Diagnosis not present

## 2014-07-16 LAB — IRON AND TIBC
IRON: 76 ug/dL (ref 42–145)
SATURATION RATIOS: 33 % (ref 20–55)
TIBC: 229 ug/dL — ABNORMAL LOW (ref 250–470)
UIBC: 153 ug/dL (ref 125–400)

## 2014-07-16 LAB — POCT HEMOGLOBIN-HEMACUE: HEMOGLOBIN: 10.7 g/dL — AB (ref 12.0–15.0)

## 2014-07-16 LAB — FERRITIN: Ferritin: 657 ng/mL — ABNORMAL HIGH (ref 10–291)

## 2014-07-16 MED ORDER — EPOETIN ALFA 20000 UNIT/ML IJ SOLN
INTRAMUSCULAR | Status: AC
  Administered 2014-07-16: 20000 [IU] via SUBCUTANEOUS
  Filled 2014-07-16: qty 1

## 2014-07-16 MED ORDER — EPOETIN ALFA 40000 UNIT/ML IJ SOLN: 30000.0000 [IU] | INTRAMUSCULAR | Status: DC

## 2014-07-16 MED ORDER — EPOETIN ALFA 10000 UNIT/ML IJ SOLN
INTRAMUSCULAR | Status: AC
  Administered 2014-07-16: 10000 [IU] via SUBCUTANEOUS
  Filled 2014-07-16: qty 1

## 2014-07-30 ENCOUNTER — Encounter (HOSPITAL_COMMUNITY): Payer: Commercial Managed Care - HMO

## 2014-11-08 ENCOUNTER — Encounter (HOSPITAL_COMMUNITY): Payer: Self-pay | Admitting: Emergency Medicine

## 2014-11-08 ENCOUNTER — Emergency Department (HOSPITAL_COMMUNITY)
Admission: EM | Admit: 2014-11-08 | Discharge: 2014-11-09 | Disposition: A | Discharge status: Home / Self Care | Payer: Commercial Managed Care - HMO | Attending: Emergency Medicine | Admitting: Emergency Medicine

## 2014-11-08 ENCOUNTER — Emergency Department (HOSPITAL_COMMUNITY): Payer: Commercial Managed Care - HMO

## 2014-11-08 DIAGNOSIS — E780 Pure hypercholesterolemia: Secondary | ICD-10-CM | POA: Diagnosis not present

## 2014-11-08 DIAGNOSIS — H5440 Blindness, one eye, unspecified eye: Secondary | ICD-10-CM | POA: Insufficient documentation

## 2014-11-08 DIAGNOSIS — Z862 Personal history of diseases of the blood and blood-forming organs and certain disorders involving the immune mechanism: Secondary | ICD-10-CM | POA: Insufficient documentation

## 2014-11-08 DIAGNOSIS — Z792 Long term (current) use of antibiotics: Secondary | ICD-10-CM | POA: Diagnosis not present

## 2014-11-08 DIAGNOSIS — Z87828 Personal history of other (healed) physical injury and trauma: Secondary | ICD-10-CM | POA: Insufficient documentation

## 2014-11-08 DIAGNOSIS — I129 Hypertensive chronic kidney disease with stage 1 through stage 4 chronic kidney disease, or unspecified chronic kidney disease: Secondary | ICD-10-CM | POA: Diagnosis not present

## 2014-11-08 DIAGNOSIS — K5732 Diverticulitis of large intestine without perforation or abscess without bleeding: Secondary | ICD-10-CM

## 2014-11-08 DIAGNOSIS — Z7982 Long term (current) use of aspirin: Secondary | ICD-10-CM | POA: Insufficient documentation

## 2014-11-08 DIAGNOSIS — J449 Chronic obstructive pulmonary disease, unspecified: Secondary | ICD-10-CM | POA: Insufficient documentation

## 2014-11-08 DIAGNOSIS — Z79899 Other long term (current) drug therapy: Secondary | ICD-10-CM | POA: Insufficient documentation

## 2014-11-08 DIAGNOSIS — Z9981 Dependence on supplemental oxygen: Secondary | ICD-10-CM | POA: Diagnosis not present

## 2014-11-08 DIAGNOSIS — I209 Angina pectoris, unspecified: Secondary | ICD-10-CM | POA: Insufficient documentation

## 2014-11-08 DIAGNOSIS — N189 Chronic kidney disease, unspecified: Secondary | ICD-10-CM | POA: Diagnosis not present

## 2014-11-08 DIAGNOSIS — K219 Gastro-esophageal reflux disease without esophagitis: Secondary | ICD-10-CM | POA: Diagnosis not present

## 2014-11-08 DIAGNOSIS — Z8673 Personal history of transient ischemic attack (TIA), and cerebral infarction without residual deficits: Secondary | ICD-10-CM | POA: Insufficient documentation

## 2014-11-08 DIAGNOSIS — Z8709 Personal history of other diseases of the respiratory system: Secondary | ICD-10-CM | POA: Insufficient documentation

## 2014-11-08 DIAGNOSIS — R109 Unspecified abdominal pain: Secondary | ICD-10-CM | POA: Diagnosis present

## 2014-11-08 DIAGNOSIS — Z85828 Personal history of other malignant neoplasm of skin: Secondary | ICD-10-CM | POA: Diagnosis not present

## 2014-11-08 DIAGNOSIS — F419 Anxiety disorder, unspecified: Secondary | ICD-10-CM | POA: Insufficient documentation

## 2014-11-08 DIAGNOSIS — Z8701 Personal history of pneumonia (recurrent): Secondary | ICD-10-CM | POA: Diagnosis not present

## 2014-11-08 DIAGNOSIS — E119 Type 2 diabetes mellitus without complications: Secondary | ICD-10-CM | POA: Insufficient documentation

## 2014-11-08 DIAGNOSIS — M1990 Unspecified osteoarthritis, unspecified site: Secondary | ICD-10-CM | POA: Diagnosis not present

## 2014-11-08 LAB — CBC WITH DIFFERENTIAL/PLATELET
Basophils Absolute: 0 10*3/uL (ref 0.0–0.1)
Basophils Relative: 0 % (ref 0–1)
Eosinophils Absolute: 0.2 10*3/uL (ref 0.0–0.7)
Eosinophils Relative: 2 % (ref 0–5)
HCT: 32.5 % — ABNORMAL LOW (ref 36.0–46.0)
Hemoglobin: 10.1 g/dL — ABNORMAL LOW (ref 12.0–15.0)
Lymphocytes Relative: 23 % (ref 12–46)
Lymphs Abs: 2.4 10*3/uL (ref 0.7–4.0)
MCH: 28.9 pg (ref 26.0–34.0)
MCHC: 31.1 g/dL (ref 30.0–36.0)
MCV: 92.9 fL (ref 78.0–100.0)
Monocytes Absolute: 0.4 10*3/uL (ref 0.1–1.0)
Monocytes Relative: 4 % (ref 3–12)
Neutro Abs: 7.6 10*3/uL (ref 1.7–7.7)
Neutrophils Relative %: 71 % (ref 43–77)
Platelets: 165 10*3/uL (ref 150–400)
RBC: 3.5 MIL/uL — ABNORMAL LOW (ref 3.87–5.11)
RDW: 15.3 % (ref 11.5–15.5)
WBC: 10.6 10*3/uL — ABNORMAL HIGH (ref 4.0–10.5)

## 2014-11-08 LAB — COMPREHENSIVE METABOLIC PANEL
ALT: 7 U/L — ABNORMAL LOW (ref 14–54)
AST: 9 U/L — ABNORMAL LOW (ref 15–41)
Albumin: 3.1 g/dL — ABNORMAL LOW (ref 3.5–5.0)
Alkaline Phosphatase: 103 U/L (ref 38–126)
Anion gap: 12 (ref 5–15)
BUN: 42 mg/dL — ABNORMAL HIGH (ref 6–20)
CO2: 16 mmol/L — ABNORMAL LOW (ref 22–32)
Calcium: 8.4 mg/dL — ABNORMAL LOW (ref 8.9–10.3)
Chloride: 112 mmol/L — ABNORMAL HIGH (ref 101–111)
Creatinine, Ser: 5.58 mg/dL — ABNORMAL HIGH (ref 0.44–1.00)
GFR calc Af Amer: 8 mL/min — ABNORMAL LOW (ref >60)
GFR calc non Af Amer: 7 mL/min — ABNORMAL LOW (ref >60)
Glucose, Bld: 100 mg/dL — ABNORMAL HIGH (ref 70–99)
Potassium: 4.5 mmol/L (ref 3.5–5.1)
Sodium: 140 mmol/L (ref 135–145)
Total Bilirubin: 0.6 mg/dL (ref 0.3–1.2)
Total Protein: 6.6 g/dL (ref 6.5–8.1)

## 2014-11-08 LAB — URINALYSIS, ROUTINE W REFLEX MICROSCOPIC
Bilirubin Urine: NEGATIVE
Glucose, UA: 100 mg/dL — AB
Ketones, ur: NEGATIVE mg/dL
Nitrite: NEGATIVE
Protein, ur: 30 mg/dL — AB
Specific Gravity, Urine: 1.009 (ref 1.005–1.030)
Urobilinogen, UA: 0.2 mg/dL (ref 0.0–1.0)
pH: 5.5 (ref 5.0–8.0)

## 2014-11-08 LAB — LIPASE, BLOOD: Lipase: 30 U/L (ref 22–51)

## 2014-11-08 LAB — URINE MICROSCOPIC-ADD ON

## 2014-11-08 MED ORDER — FENTANYL CITRATE (PF) 100 MCG/2ML IJ SOLN
50.0000 ug | Freq: Once | INTRAMUSCULAR | Status: AC
  Administered 2014-11-08: 50 ug via INTRAVENOUS
  Filled 2014-11-08: qty 2

## 2014-11-08 MED ORDER — ONDANSETRON HCL 4 MG/2ML IJ SOLN
4.0000 mg | Freq: Once | INTRAMUSCULAR | Status: AC
  Administered 2014-11-08: 4 mg via INTRAVENOUS
  Filled 2014-11-08: qty 2

## 2014-11-08 MED ORDER — CIPROFLOXACIN IN D5W 400 MG/200ML IV SOLN
400.0000 mg | Freq: Once | INTRAVENOUS | Status: AC
  Administered 2014-11-08: 400 mg via INTRAVENOUS
  Filled 2014-11-08: qty 200

## 2014-11-08 MED ORDER — METRONIDAZOLE IN NACL 5-0.79 MG/ML-% IV SOLN
500.0000 mg | Freq: Once | INTRAVENOUS | Status: AC
  Administered 2014-11-08: 500 mg via INTRAVENOUS
  Filled 2014-11-08: qty 100

## 2014-11-08 NOTE — ED Notes (Addendum)
To ED via [facility] from home with c/o left flank pain -- hurts to move and breath, states " Sometimes this happens when I do not have a good BM I feel like this, I also feel bumps around my hairline"   Hx of CVA that has affected vision-- center visual field up close is diminished, peripheral vision on right side is worse than left. Walks with a walker at home.

## 2014-11-09 MED ORDER — OXYCODONE-ACETAMINOPHEN 5-325 MG PO TABS: 1.0000 | ORAL_TABLET | ORAL | Status: AC | PRN

## 2014-11-09 MED ORDER — FENTANYL CITRATE (PF) 100 MCG/2ML IJ SOLN
75.0000 ug | Freq: Once | INTRAMUSCULAR | Status: AC
  Administered 2014-11-09: 75 ug via INTRAVENOUS
  Filled 2014-11-09: qty 2

## 2014-11-09 MED ORDER — CIPROFLOXACIN HCL 500 MG PO TABS: 500.0000 mg | ORAL_TABLET | Freq: Two times a day (BID) | ORAL | Status: AC

## 2014-11-09 MED ORDER — METRONIDAZOLE 500 MG PO TABS: 500.0000 mg | ORAL_TABLET | Freq: Three times a day (TID) | ORAL | Status: AC

## 2014-11-09 NOTE — Discharge Instructions (Signed)
Diverticulitis Diverticulitis is inflammation or infection of small pouches in your colon that form when you have a condition called diverticulosis. The pouches in your colon are called diverticula. Your colon, or large intestine, is where water is absorbed and stool is formed. Complications of diverticulitis can include:  Bleeding.  Severe infection.  Severe pain.  Perforation of your colon.  Obstruction of your colon. CAUSES  Diverticulitis is caused by bacteria. Diverticulitis happens when stool becomes trapped in diverticula. This allows bacteria to grow in the diverticula, which can lead to inflammation and infection. RISK FACTORS People with diverticulosis are at risk for diverticulitis. Eating a diet that does not include enough fiber from fruits and vegetables may make diverticulitis more likely to develop. SYMPTOMS  Symptoms of diverticulitis may include:  Abdominal pain and tenderness. The pain is normally located on the left side of the abdomen, but may occur in other areas.  Fever and chills.  Bloating.  Cramping.  Nausea.  Vomiting.  Constipation.  Diarrhea.  Blood in your stool. DIAGNOSIS  Your health care provider will ask you about your medical history and do a physical exam. You may need to have tests done because many medical conditions can cause the same symptoms as diverticulitis. Tests may include:  Blood tests.  Urine tests.  Imaging tests of the abdomen, including X-rays and CT scans. When your condition is under control, your health care provider may recommend that you have a colonoscopy. A colonoscopy can show how severe your diverticula are and whether something else is causing your symptoms. TREATMENT  Most cases of diverticulitis are mild and can be treated at home. Treatment may include:  Taking over-the-counter pain medicines.  Following a clear liquid diet.  Taking antibiotic medicines by mouth for 7-10 days. More severe cases may  be treated at a hospital. Treatment may include:  Not eating or drinking.  Taking prescription pain medicine.  Receiving antibiotic medicines through an IV tube.  Receiving fluids and nutrition through an IV tube.  Surgery. HOME CARE INSTRUCTIONS   Follow your health care provider's instructions carefully.  Follow a full liquid diet or other diet as directed by your health care provider. After your symptoms improve, your health care provider may tell you to change your diet. He or she may recommend you eat a high-fiber diet. Fruits and vegetables are good sources of fiber. Fiber makes it easier to pass stool.  Take fiber supplements or probiotics as directed by your health care provider.  Only take medicines as directed by your health care provider.  Keep all your follow-up appointments. SEEK MEDICAL CARE IF:   Your pain does not improve.  You have a hard time eating food.  Your bowel movements do not return to normal. SEEK IMMEDIATE MEDICAL CARE IF:   Your pain becomes worse.  Your symptoms do not get better.  Your symptoms suddenly get worse.  You have a fever.  You have repeated vomiting.  You have bloody or black, tarry stools. MAKE SURE YOU:   Understand these instructions.  Will watch your condition.  Will get help right away if you are not doing well or get worse. Document Released: 03/29/2005 Document Revised: 06/24/2013 Document Reviewed: 05/14/2013 [facility] Patient Information 2015 [place], [place]. This information is not intended to replace advice given to you by your health care provider. Make sure you discuss any questions you have with your health care provider.  Diverticulitis Diverticulitis is when small pockets that have formed in your colon (large  intestine) become infected or swollen. HOME CARE  Follow your doctor's instructions.  Follow a special diet if told by your doctor.  When you feel better, your doctor may tell you to change your  diet. You may be told to eat a lot of fiber. Fruits and vegetables are good sources of fiber. Fiber makes it easier to poop (have bowel movements).  Take supplements or probiotics as told by your doctor.  Only take medicines as told by your doctor.  Keep all follow-up visits with your doctor. GET HELP IF:  Your pain does not get better.  You have a hard time eating food.  You are not pooping like normal. GET HELP RIGHT AWAY IF:  Your pain gets worse.  Your problems do not get better.  Your problems suddenly get worse.  You have a fever.  You keep throwing up (vomiting).  You have bloody or black, tarry poop (stool). MAKE SURE YOU:   Understand these instructions.  Will watch your condition.  Will get help right away if you are not doing well or get worse. Document Released: 12/06/2007 Document Revised: 06/24/2013 Document Reviewed: 05/14/2013 [facility] Patient Information 2015 [place], [place]. This information is not intended to replace advice given to you by your health care provider. Make sure you discuss any questions you have with your health care provider.

## 2014-11-09 NOTE — ED Notes (Signed)
Pt had an episode of emesis as she was being wheeled out. This RN spoke with Colin Rhein, MD who states that the pt can have zofran for her nausea, but the pt declined, stating she feels fine now and wants to go home. Gabe, RN has wheeled the pt out to the lobby.

## 2014-11-18 NOTE — ED Provider Notes (Signed)
CSN: [id number]     Arrival date & time 11/08/14  1856 History   First MD Initiated Contact with Patient 11/08/14 1902     Chief Complaint  Patient presents with  . Flank Pain  . Rash     (Consider location/radiation/quality/duration/timing/severity/associated sxs/prior Treatment) HPI   69 year old female with abdominal pain. Left abdomen/left flank. Gradual onset yesterday and simply worsened today. Pain is constant. Worse with movement. Mild nausea, no vomiting. No change in stool. No urinary complaints. No fevers or chills.  Past Medical History  Diagnosis Date  . Hypertension   . COPD (chronic obstructive pulmonary disease)   . GERD (gastroesophageal reflux disease)   . Hypercholesteremia   . Anxiety   . Stroke   . Headaches due to old head injury   . Blind one eye   . Angina   . Pneumonia   . Recurrent upper respiratory infection (URI)   . Anemia   . Arthritis   . Complication of anesthesia     difficulty waking up  . Chronic kidney disease   . Cancer     skin cancer right thumb  . Temporal arteritis 11/17/2011  . Gastric polyp     antral  . Stenosis of esophagus   . Diabetes mellitus     TYpe 2 IDDM x 11 years  . Bursitis   . Shortness of breath     uses O2 2 L at night  . On home oxygen therapy     "2L at bedtime"   Past Surgical History  Procedure Laterality Date  . Abdominal hysterectomy    . Cholecystectomy    . Heel spur surgery      bilateral  . Esophagogastroduodenoscopy  11/18/2011    Procedure: ESOPHAGOGASTRODUODENOSCOPY (EGD);  Surgeon: Gatha Mayer, MD;  Location: [facility] ENDOSCOPY;  Service: Endoscopy;  Laterality: N/A;  . Eye surgery      cataracts/IOL  . Brain surgery      temporal arteritis  . Av fistula placement Right 08/14/2013    Procedure: ARTERIOVENOUS (AV) FISTULA CREATION;  Surgeon: Serafina Mitchell, MD;  Location: [place];  Service: Vascular;  Laterality: Right;  . Av fistula placement Right 08/14/2013    Procedure:  CREATION  BRACHIOCEPHALIC ARTERIOVENOUS (AV) FISTULA;  Surgeon: Serafina Mitchell, MD;  Location: [place];  Service: Vascular;  Laterality: Right;  . Fistula superficialization Right 01/08/2014    Procedure: FISTULA SUPERFICIALIZATION;  Surgeon: Mal Misty, MD;  Location: [place];  Service: Vascular;  Laterality: Right;  . Colonoscopy    . Appendectomy    . Bascilic vein transposition Right 04/09/2014    Procedure: RIGHT ARM [place];  Surgeon: Mal Misty, MD;  Location: [place];  Service: Vascular;  Laterality: Right;  . Shuntogram Right 11/19/2013    Procedure: FISTULOGRAM;  Surgeon: Serafina Mitchell, MD;  Location: [facility] CATH LAB;  Service: Cardiovascular;  Laterality: Right;   Family History  Problem Relation Age of Onset  . Colon cancer    . Diabetes Brother   . Hypertension Mother   . COPD Father   . Diabetes Father    History  Substance Use Topics  . Smoking status: Never Smoker   . Smokeless tobacco: Never Used  . Alcohol Use: No   OB History    No data available     Review of Systems  All systems reviewed and negative, other than as noted in HPI.   Allergies  Codeine and Tramadol  Home  Medications   Prior to Admission medications   Medication Sig Start Date End Date Taking? Authorizing Provider  albuterol (PROVENTIL) (2.5 MG/3ML) 0.083% nebulizer solution Take 2.5 mg by nebulization every 6 (six) hours as needed for wheezing. For wheezing   Yes Historical Provider, MD  amLODipine (NORVASC) 10 MG tablet Take 10 mg by mouth daily. 11/04/14  Yes Historical Provider, MD  aspirin 325 MG tablet Take 325 mg by mouth daily.   Yes Historical Provider, MD  atorvastatin (LIPITOR) 40 MG tablet Take 40 mg by mouth daily. 11/03/14  Yes Historical Provider, MD  calcitRIOL (ROCALTROL) 0.25 MCG capsule Take 0.25 mcg by mouth. 11/03/14  Yes Historical Provider, MD  furosemide (LASIX) 40 MG tablet Take 40 mg by mouth daily.   Yes Historical Provider, MD  gabapentin (NEURONTIN) 400 MG  capsule Take 400 mg by mouth 2 (two) times daily. 09/20/14  Yes Historical Provider, MD  loratadine (CLARITIN) 10 MG tablet Take 10 mg by mouth every morning.    Yes Historical Provider, MD  lubiprostone (AMITIZA) 24 MCG capsule Take 24 mcg by mouth daily with breakfast.   Yes Historical Provider, MD  oxyCODONE (OXY IR/ROXICODONE) 5 MG immediate release tablet Take 5 mg by mouth. 09/22/14  Yes Historical Provider, MD  pantoprazole (PROTONIX) 40 MG tablet Take 40 mg by mouth every morning.   Yes Historical Provider, MD  potassium chloride (K-DUR) 10 MEQ tablet Take 10 mEq by mouth every morning.   Yes Historical Provider, MD  timolol (BETIMOL) 0.5 % ophthalmic solution Place 1 drop into both eyes 2 (two) times daily.   Yes Historical Provider, MD  acetaminophen (TYLENOL) 325 MG tablet Take 2 tablets (650 mg total) by mouth every 6 (six) hours as needed for mild pain (or Fever >/= 101). 04/15/14   Kinnie Feil, MD  ciprofloxacin (CIPRO) 500 MG tablet Take 1 tablet (500 mg total) by mouth every 12 (twelve) hours. 11/09/14   Virgel Manifold, MD  diazepam (VALIUM) 5 MG tablet Take 5 mg by mouth every 6 (six) hours as needed. For anxiety    Historical Provider, MD  insulin aspart (NOVOLOG) 100 UNIT/ML injection Inject 13-16 Units into the skin daily as needed. Per sliding scale    Historical Provider, MD  insulin detemir (LEVEMIR) 100 UNIT/ML injection Inject into the skin.    Historical Provider, MD  metroNIDAZOLE (FLAGYL) 500 MG tablet Take 1 tablet (500 mg total) by mouth 3 (three) times daily. 11/09/14   Virgel Manifold, MD  oxyCODONE-acetaminophen (PERCOCET/ROXICET) 5-325 MG per tablet Take 1-2 tablets by mouth every 4 (four) hours as needed for severe pain. 11/09/14   Virgel Manifold, MD  oxyCODONE-acetaminophen (ROXICET) 5-325 MG per tablet Take 1 tablet by mouth every 6 (six) hours as needed for severe pain. 04/09/14   Samantha J Rhyne, PA-C  predniSONE (DELTASONE) 20 MG tablet Take 2 tablets (40 mg total) by  mouth daily with breakfast. Patient not taking: Reported on 11/08/2014 04/15/14   Kinnie Feil, MD   BP 123/53 mmHg  Pulse 79  Temp(Src) 98 F (36.7 C) (Oral)  Resp 19  SpO2 96% Physical Exam  Constitutional: She appears well-developed and well-nourished. No distress.  HENT:  Head: Normocephalic and atraumatic.  Eyes: Conjunctivae are normal. Right eye exhibits no discharge. Left eye exhibits no discharge.  Neck: Neck supple.  Cardiovascular: Normal rate, regular rhythm and normal heart sounds.  Exam reveals no gallop and no friction rub.   No murmur heard. Pulmonary/Chest: Effort normal and  breath sounds normal. No respiratory distress.  Abdominal: Soft. She exhibits no distension. There is tenderness. There is no rebound and no guarding.  LLQ tenderness  Musculoskeletal: She exhibits no edema or tenderness.  Neurological: She is alert.  Skin: Skin is warm and dry.  Psychiatric: She has a normal mood and affect. Her behavior is normal. Thought content normal.  Nursing note and vitals reviewed.   ED Course  Procedures (including critical care time) Labs Review Labs Reviewed  URINALYSIS, ROUTINE W REFLEX MICROSCOPIC - Abnormal; Notable for the following:    Glucose, UA 100 (*)    Hgb urine dipstick SMALL (*)    Protein, ur 30 (*)    Leukocytes, UA SMALL (*)    All other components within normal limits  COMPREHENSIVE METABOLIC PANEL - Abnormal; Notable for the following:    Chloride 112 (*)    CO2 16 (*)    Glucose, Bld 100 (*)    BUN 42 (*)    Creatinine, Ser 5.58 (*)    Calcium 8.4 (*)    Albumin 3.1 (*)    AST 9 (*)    ALT 7 (*)    GFR calc non Af Amer 7 (*)    GFR calc Af Amer 8 (*)    All other components within normal limits  CBC WITH DIFFERENTIAL/PLATELET - Abnormal; Notable for the following:    WBC 10.6 (*)    RBC 3.50 (*)    Hemoglobin 10.1 (*)    HCT 32.5 (*)    All other components within normal limits  LIPASE, BLOOD  URINE MICROSCOPIC-ADD ON     Imaging Review No results found.   Ct Abdomen Pelvis Wo Contrast  11/08/2014   CLINICAL DATA:  Left flank pain.  EXAM: CT ABDOMEN AND PELVIS WITHOUT CONTRAST  TECHNIQUE: Multidetector CT imaging of the abdomen and pelvis was performed following the standard protocol without IV contrast.  COMPARISON:  11/18/2013  FINDINGS: Slight infiltration or atelectasis in the lung bases. Minimal pericardial effusion or thickening. Calcification at the mitral valve annulus.  Bilateral renal parenchymal atrophy. No hydronephrosis or hydroureter. No renal, ureteral, or bladder stones identified.  Surgical absence of the gallbladder. The unenhanced appearance of the liver, spleen, pancreas, adrenal glands, abdominal aorta, inferior vena cava, and retroperitoneal lymph nodes is unremarkable. Stomach, small bowel, and colon are decompressed. No free air or free fluid in the abdomen. Abdominal wall musculature appears intact.  Pelvis: Diverticulosis of the sigmoid colon. There is mild scarring in infiltration around the sigmoid colon, similar to previous study. This suggests mild diverticulitis. No abscess. No free air. Surgical absence of the uterus. No abnormal adnexal masses. Appendix is normal. Degenerative changes in the spine. No destructive bone lesions.  IMPRESSION: Diverticulosis of the sigmoid colon with infiltration around the sigmoid colon suggesting mild diverticulitis. No abscess. No renal or ureteral stone or obstruction. Mild renal parenchymal atrophy.   Electronically Signed   By: Lucienne Capers M.D.   On: 11/08/2014 21:44     EKG Interpretation None      MDM   Final diagnoses:  Diverticulitis of large intestine without perforation or abscess without bleeding    37-year-old female with abdominal pain. Imaging significant for diverticulitis. No evidence of perforation or abscess. Patient is afebrile. He did medically stable. No peritonitis on exam. I feel she is appropriate for outpatient  treatment. Given a dose ciprofloxacin and Flagyl and ADD. Will discharge the prescription for the same. Return cautions discussed.  Virgel Manifold, MD 11/18/14 1247

## 2015-02-17 IMAGING — CT CT HIP*R* W/O CM
2 of 3 series · 17 of 46 positions shown, 19 images · non-contrast
Comparison: Plain films of the right hip 11/28/2017. CT abdomen and
pelvis 11/18/2013 and 11/17/2011.

CLINICAL DATA: Posterior hip pain for 3 days.  No known injury.

EXAM:
CT OF THE RIGHT HIP WITHOUT CONTRAST
TECHNIQUE: Multidetector CT imaging was performed according to the standard
protocol. Multiplanar CT image reconstructions were also generated.

[Series 4: pelvis 5.0 i31f 1 · axial · 0.41mm/px · z∈[-360,-190]mm · 14 of 40 slices shown, 16 images]
[im 3/40  soft-tissue]
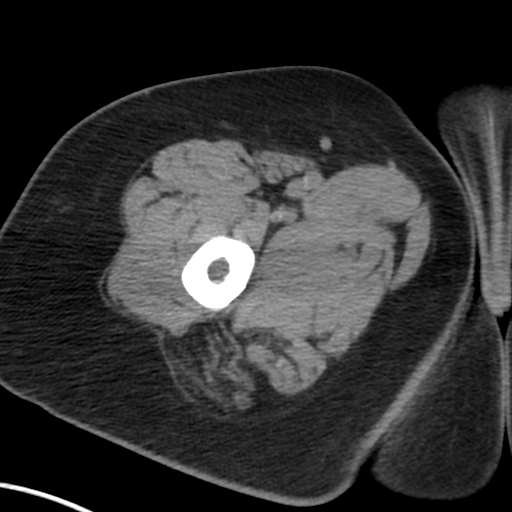
[im 3/40  bone]
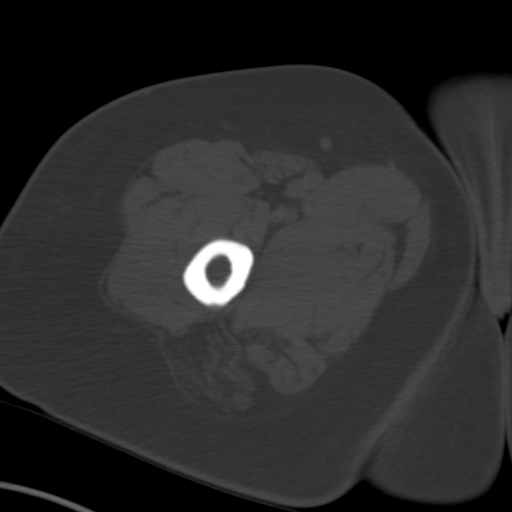
[im 6/40  soft-tissue]
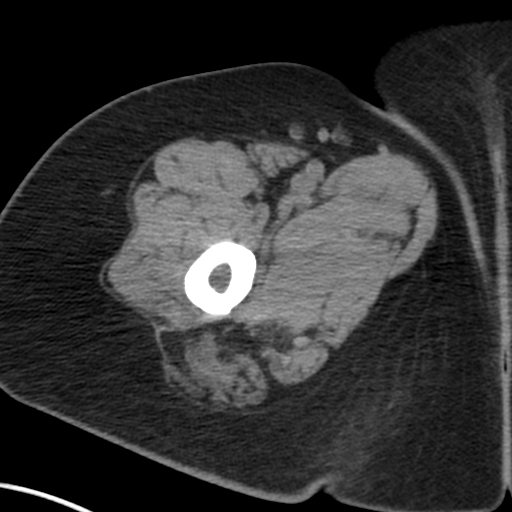
[im 8/40  soft-tissue]
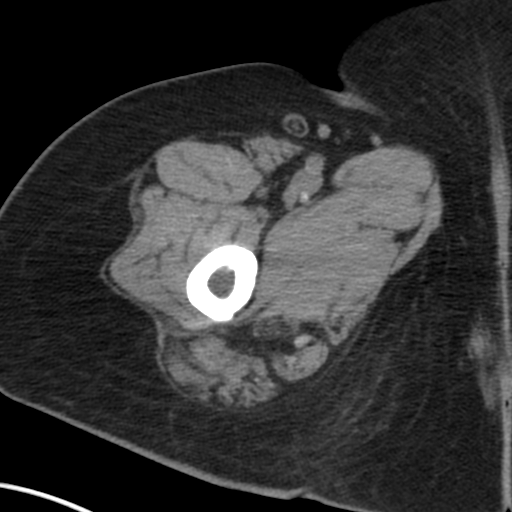
[im 11/40  soft-tissue]
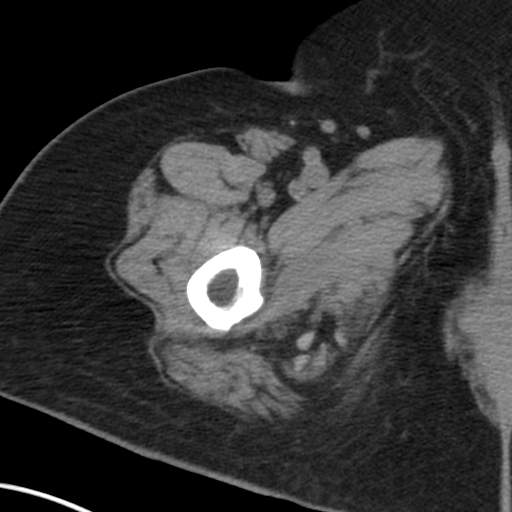
[im 13/40  soft-tissue]
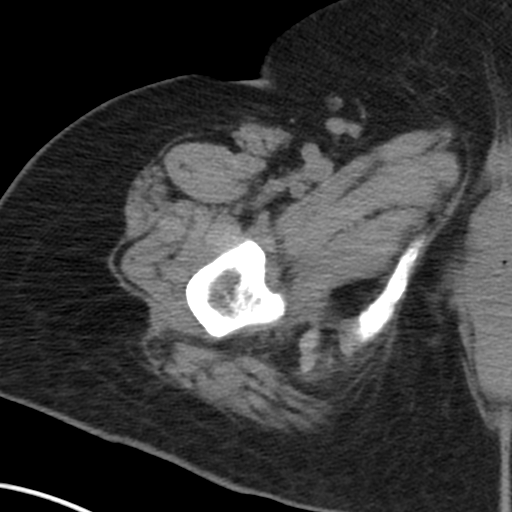
[im 16/40  soft-tissue]
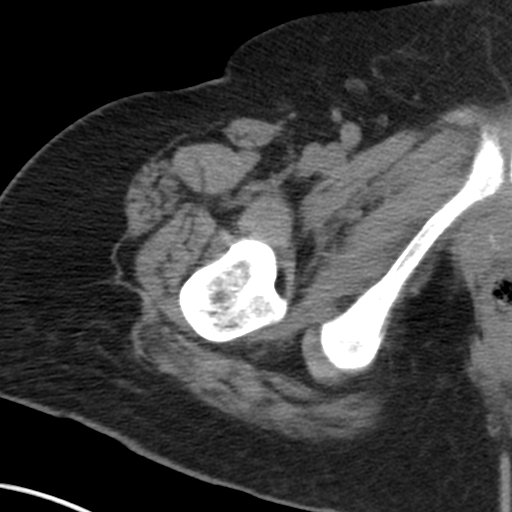
[im 18/40  soft-tissue]
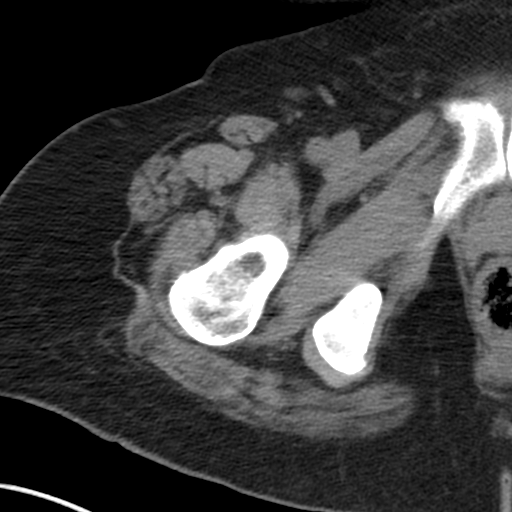
[im 22/40  soft-tissue]
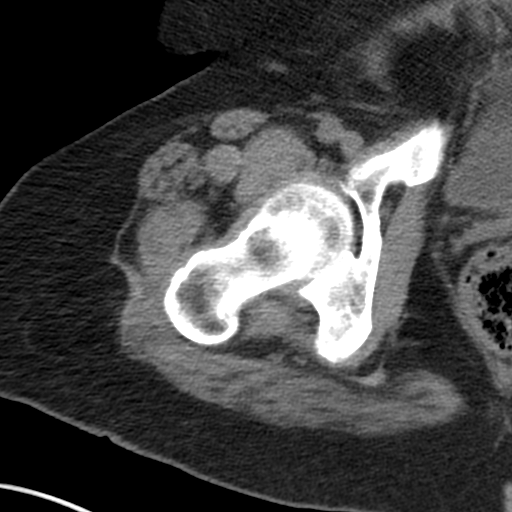
[im 24/40  soft-tissue]
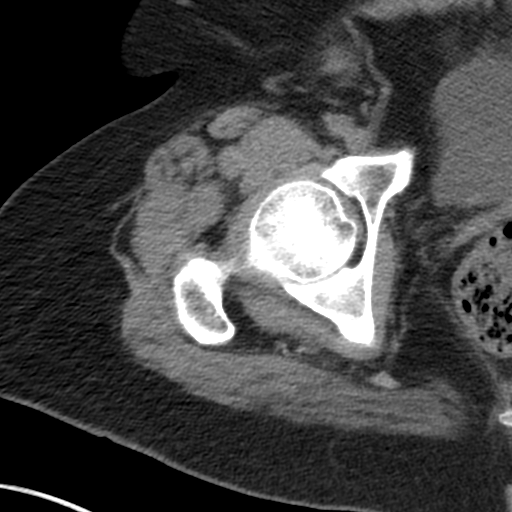
[im 24/40  bone]
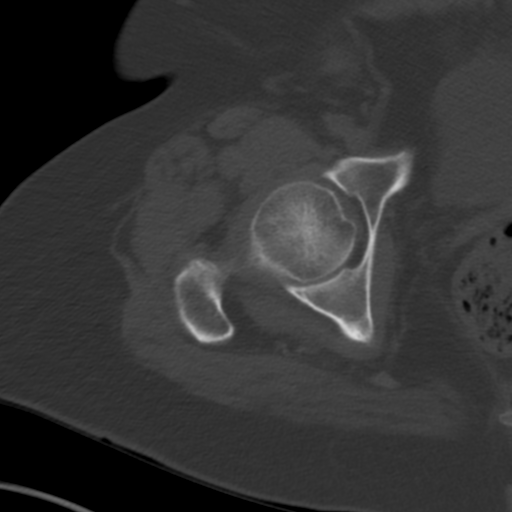
[im 27/40  soft-tissue]
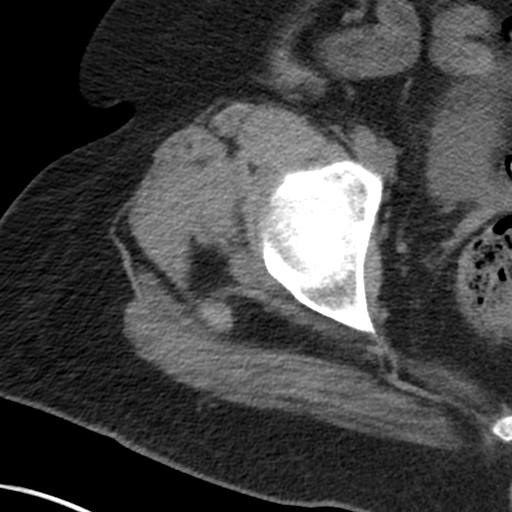
[im 29/40  soft-tissue]
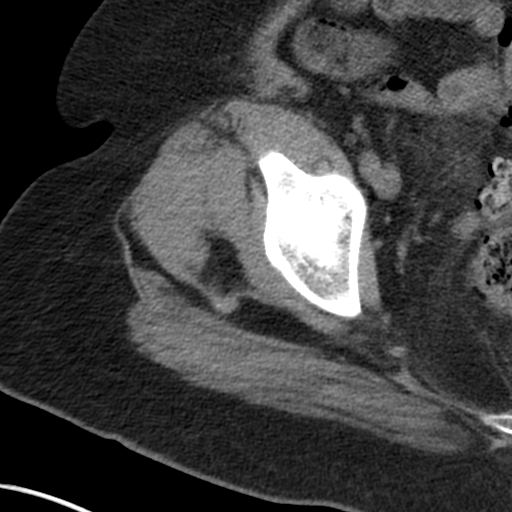
[im 32/40  soft-tissue]
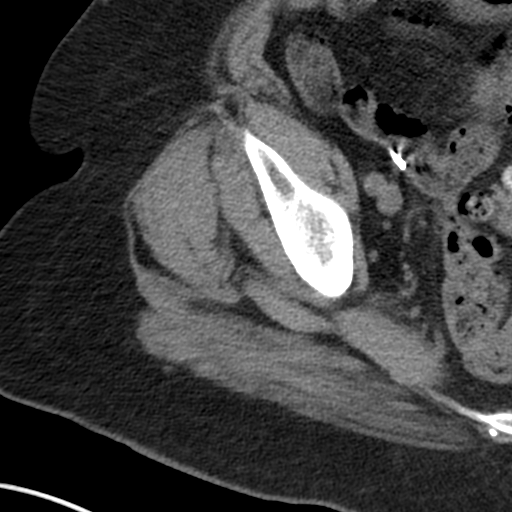
[im 34/40  soft-tissue]
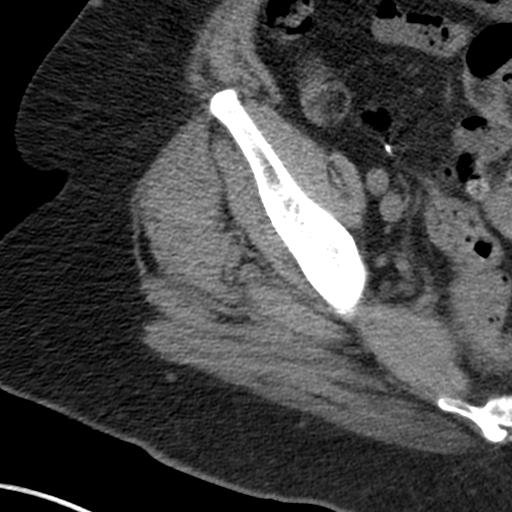
[im 37/40  soft-tissue]
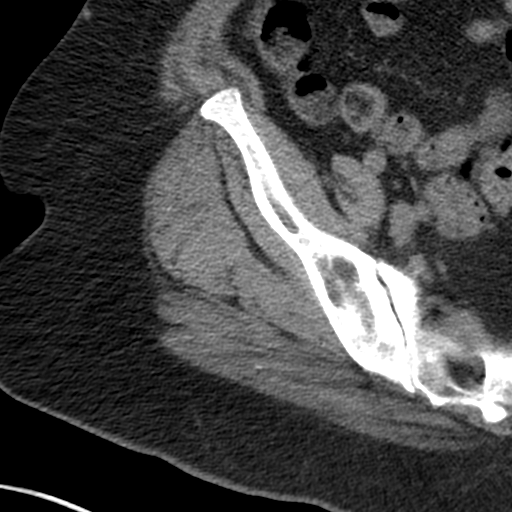

[Series 9: coronal · coronal · 0.38mm/px · 3 of 51 slices shown]
[im 17/51  soft-tissue]
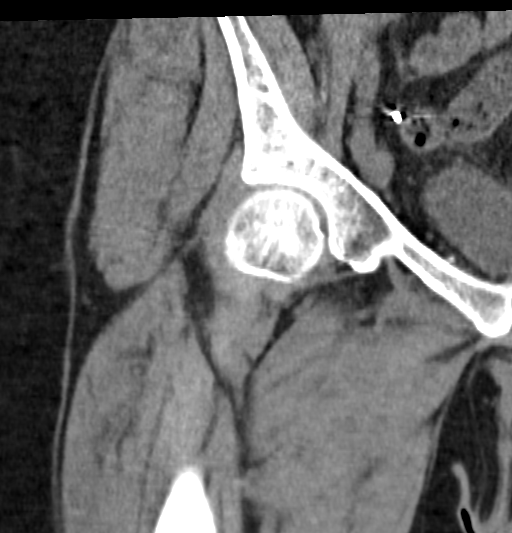
[im 23/51  soft-tissue]
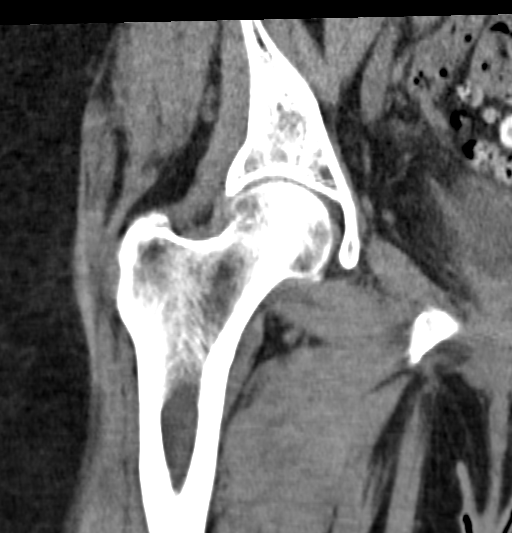
[im 28/51  soft-tissue]
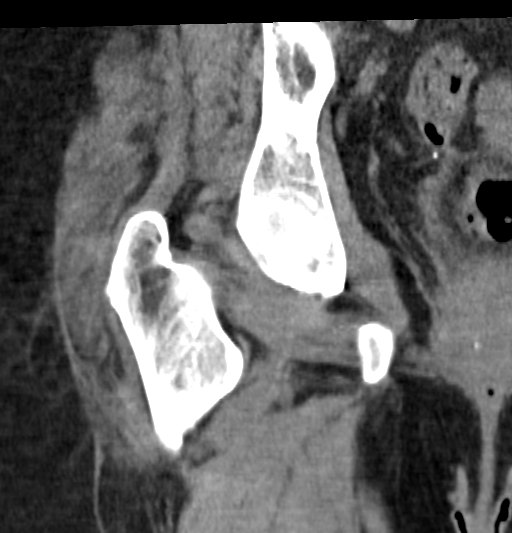

[17 of 46 positions shown; findings below may reference images not displayed]

FINDINGS: The right hip is located and there is no fracture. Small sclerotic
lesions in the posterior right ilium are compatible with bone
islands and unchanged. There is no degenerative disease about the
right hip. No hip joint effusion is seen. All visualized pelvic
musculature appears normal. No evidence of bursitis is seen. Imaged
intrapelvic contents are unremarkable.
IMPRESSION: Negative examination.  No finding to explain the patient's pain.

## 2015-02-17 IMAGING — CR DG HIP COMPLETE 2+V*R*
3 series · 3 of 3 positions shown · non-contrast
Comparison: None.

CLINICAL DATA: Right hip pain.

EXAM:
RIGHT HIP - COMPLETE 2+ VIEW

[t pelvis ap]
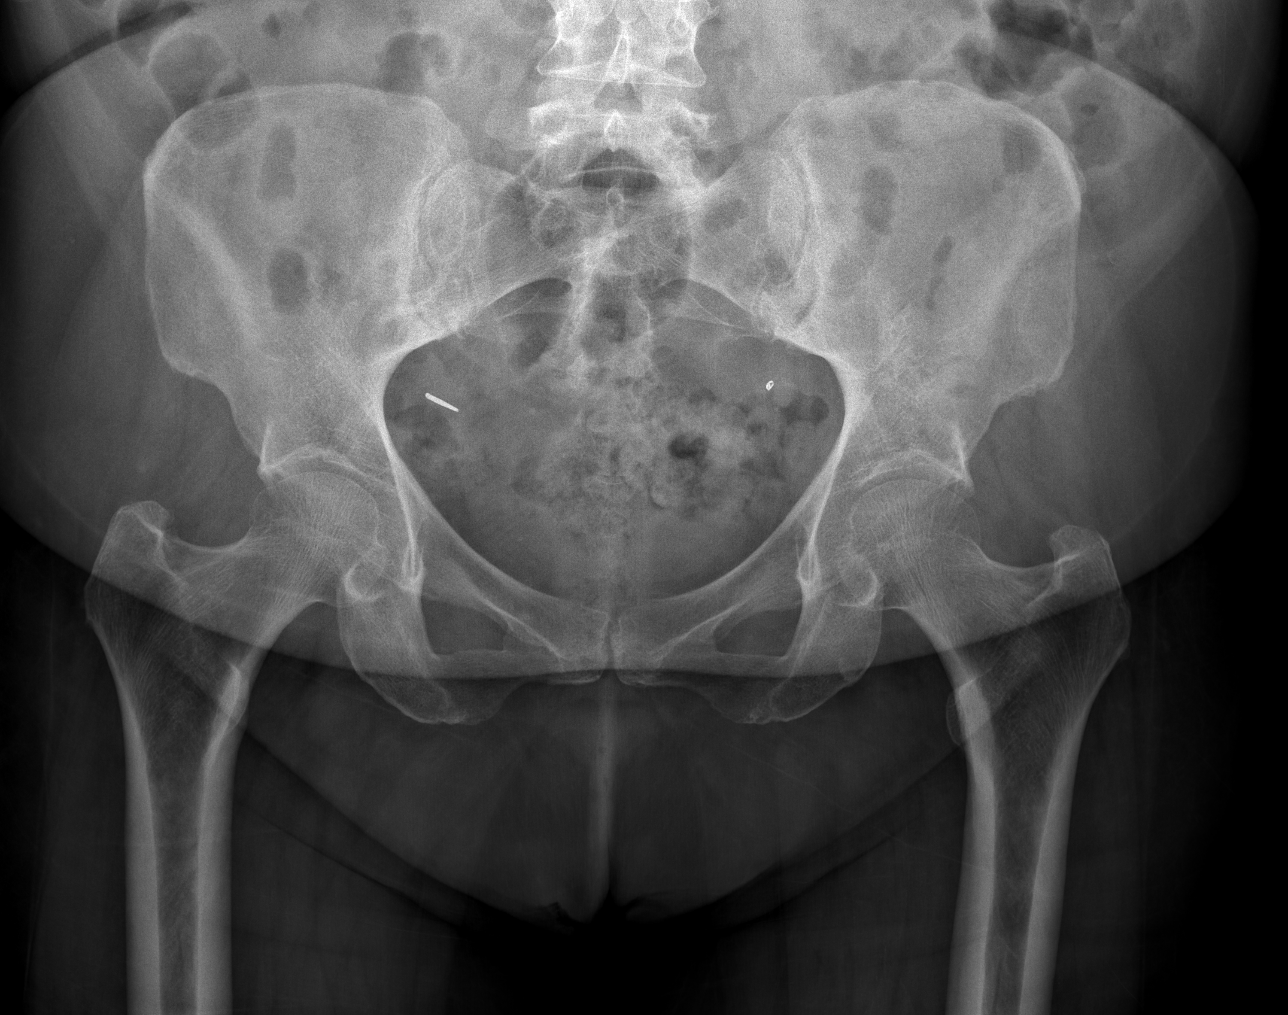

[t hip ap right]
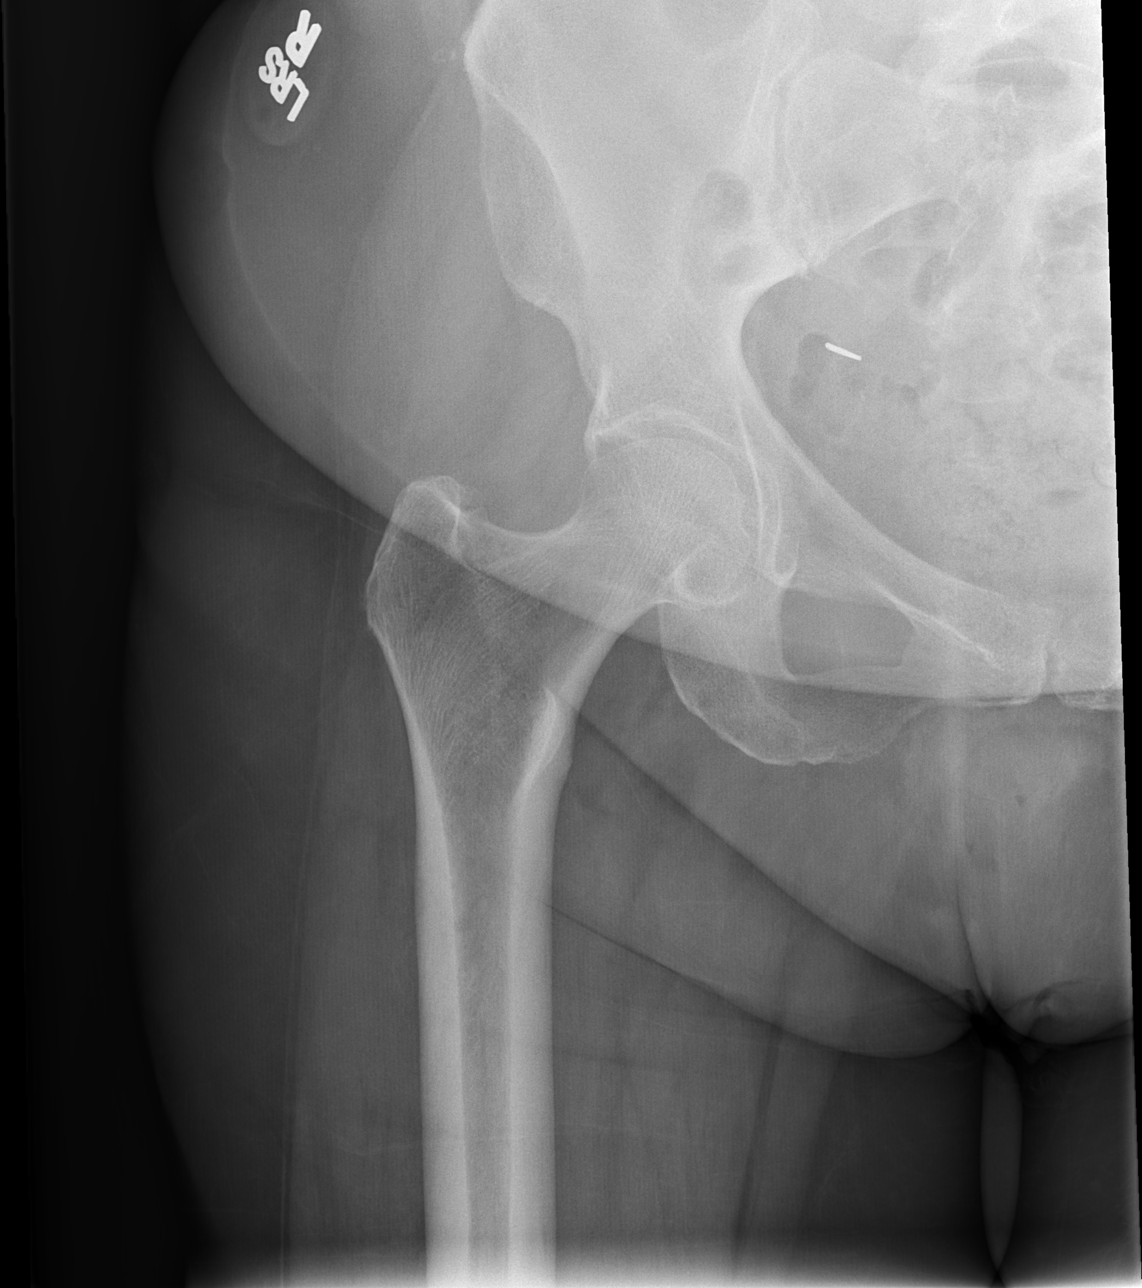

[t hip frog leg right]
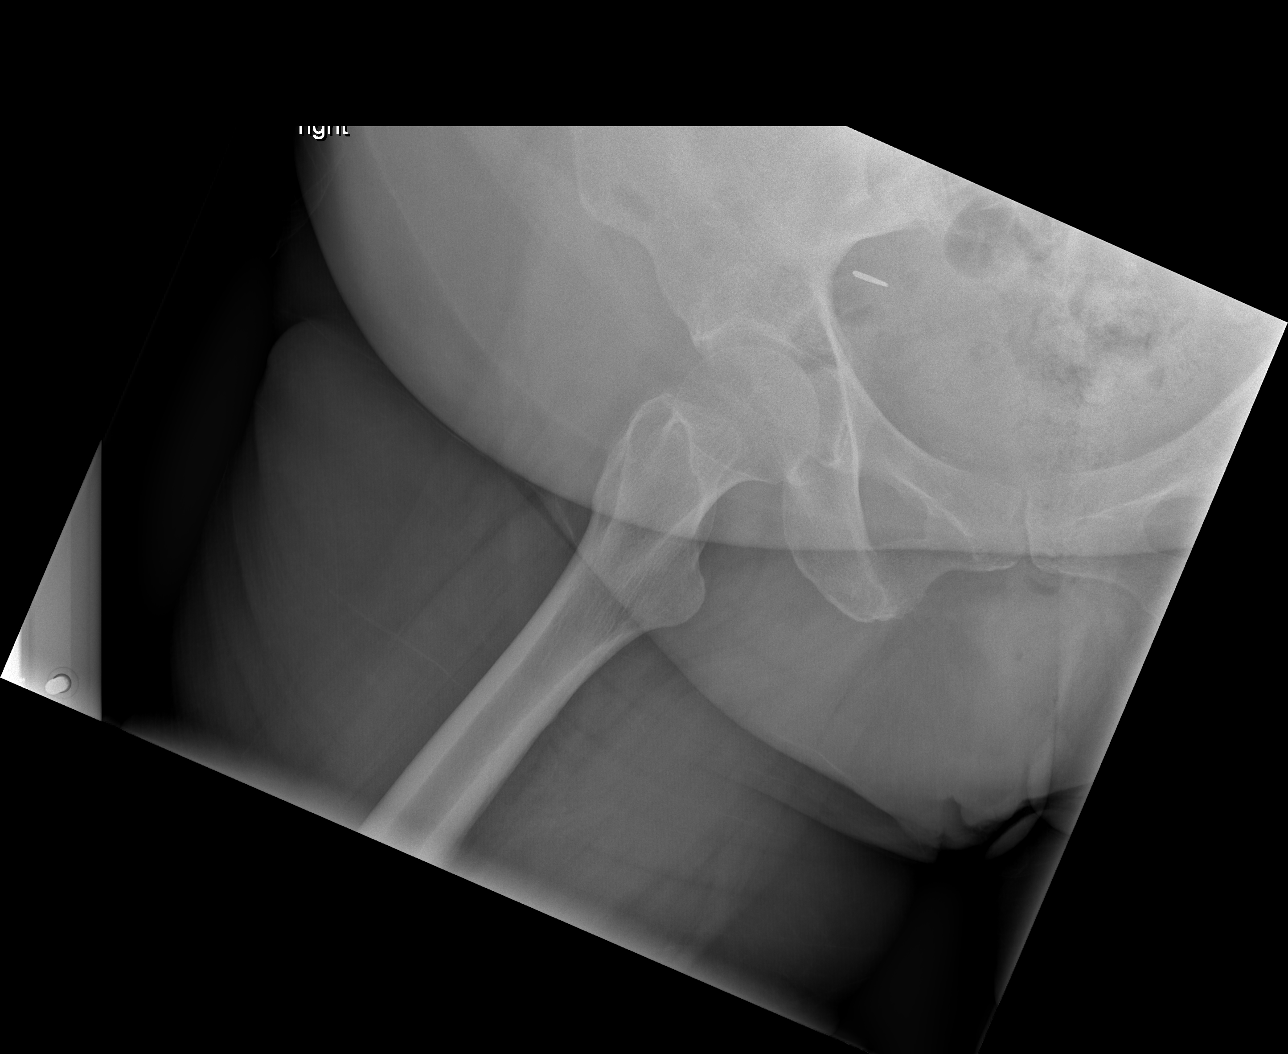

[3 of 3 positions shown; findings below may reference images not displayed]

FINDINGS: There is no evidence of fracture or dislocation. Both femoral heads
are seated normally within their respective acetabula. The proximal
right femur appears intact. No significant degenerative change is
appreciated. The sacroiliac joints are unremarkable in appearance.

The visualized bowel gas pattern is grossly unremarkable in
appearance. Bilateral tubal ligation clips are seen.
IMPRESSION: No evidence of fracture or dislocation.

## 2017-03-03 DEATH — deceased
